# Patient Record
Sex: Female | Born: 1985 | Race: White | Hispanic: No | Marital: Single | Smoking: Current every day smoker
Health system: Southern US, Community
[De-identification: ages and names within clinical notes are randomized; demographics above are authoritative.]

## PROBLEM LIST (undated history)

## (undated) ENCOUNTER — Inpatient Hospital Stay (HOSPITAL_COMMUNITY): Payer: Self-pay

## (undated) DIAGNOSIS — G935 Compression of brain: Secondary | ICD-10-CM

## (undated) DIAGNOSIS — Z789 Other specified health status: Secondary | ICD-10-CM

## (undated) DIAGNOSIS — A5609 Other chlamydial infection of lower genitourinary tract: Secondary | ICD-10-CM

## (undated) HISTORY — DX: Other chlamydial infection of lower genitourinary tract: A56.09

## (undated) HISTORY — DX: Compression of brain: G93.5

## (undated) HISTORY — PX: ARM WOUND REPAIR / CLOSURE: SUR1141

## (undated) HISTORY — PX: DILATION AND CURETTAGE OF UTERUS: SHX78

## (undated) HISTORY — PX: OTHER SURGICAL HISTORY: SHX169

## (undated) HISTORY — PX: WISDOM TOOTH EXTRACTION: SHX21

---
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
## 2004-02-25 ENCOUNTER — Ambulatory Visit: Payer: Self-pay | Admitting: Family Medicine

## 2004-07-20 ENCOUNTER — Ambulatory Visit: Payer: Self-pay | Admitting: Family Medicine

## 2004-08-04 ENCOUNTER — Ambulatory Visit: Payer: Self-pay | Admitting: Family Medicine

## 2004-08-04 ENCOUNTER — Inpatient Hospital Stay (HOSPITAL_COMMUNITY): Admission: AD | Admit: 2004-08-04 | Discharge: 2004-08-04 | Payer: Self-pay | Admitting: Family Medicine

## 2004-08-07 ENCOUNTER — Ambulatory Visit: Payer: Self-pay | Admitting: Family Medicine

## 2004-10-11 ENCOUNTER — Ambulatory Visit: Payer: Self-pay | Admitting: Family Medicine

## 2004-10-30 ENCOUNTER — Ambulatory Visit: Payer: Self-pay | Admitting: Psychology

## 2005-01-11 ENCOUNTER — Ambulatory Visit: Payer: Self-pay | Admitting: Family Medicine

## 2005-03-27 ENCOUNTER — Ambulatory Visit: Payer: Self-pay | Admitting: Sports Medicine

## 2005-04-11 ENCOUNTER — Emergency Department (HOSPITAL_COMMUNITY): Admission: EM | Admit: 2005-04-11 | Discharge: 2005-04-11 | Payer: Self-pay | Admitting: Emergency Medicine

## 2005-06-08 ENCOUNTER — Ambulatory Visit: Payer: Self-pay | Admitting: Family Medicine

## 2005-06-20 ENCOUNTER — Ambulatory Visit: Payer: Self-pay | Admitting: Family Medicine

## 2005-06-25 ENCOUNTER — Encounter: Admission: RE | Admit: 2005-06-25 | Discharge: 2005-06-25 | Payer: Self-pay | Admitting: Sports Medicine

## 2005-08-29 ENCOUNTER — Ambulatory Visit: Payer: Self-pay | Admitting: Family Medicine

## 2005-09-11 ENCOUNTER — Ambulatory Visit: Payer: Self-pay | Admitting: Family Medicine

## 2005-10-14 ENCOUNTER — Emergency Department (HOSPITAL_COMMUNITY): Admission: EM | Admit: 2005-10-14 | Discharge: 2005-10-14 | Payer: Self-pay | Admitting: Emergency Medicine

## 2005-12-07 ENCOUNTER — Other Ambulatory Visit: Admission: RE | Admit: 2005-12-07 | Discharge: 2005-12-07 | Payer: Self-pay | Admitting: Family Medicine

## 2005-12-07 ENCOUNTER — Ambulatory Visit: Payer: Self-pay | Admitting: Family Medicine

## 2005-12-25 ENCOUNTER — Emergency Department (HOSPITAL_COMMUNITY): Admission: EM | Admit: 2005-12-25 | Discharge: 2005-12-25 | Payer: Self-pay | Admitting: Family Medicine

## 2005-12-31 ENCOUNTER — Emergency Department (HOSPITAL_COMMUNITY): Admission: EM | Admit: 2005-12-31 | Discharge: 2005-12-31 | Payer: Self-pay | Admitting: Family Medicine

## 2006-03-28 ENCOUNTER — Ambulatory Visit: Payer: Self-pay | Admitting: Sports Medicine

## 2006-04-05 ENCOUNTER — Encounter (INDEPENDENT_AMBULATORY_CARE_PROVIDER_SITE_OTHER): Payer: Self-pay | Admitting: Specialist

## 2006-04-05 ENCOUNTER — Encounter (INDEPENDENT_AMBULATORY_CARE_PROVIDER_SITE_OTHER): Payer: Self-pay | Admitting: Family Medicine

## 2006-04-05 ENCOUNTER — Other Ambulatory Visit: Admission: RE | Admit: 2006-04-05 | Discharge: 2006-04-05 | Payer: Self-pay | Admitting: Family Medicine

## 2006-04-05 ENCOUNTER — Ambulatory Visit: Payer: Self-pay | Admitting: Family Medicine

## 2006-04-05 LAB — CONVERTED CEMR LAB: Chlamydia, DNA Probe: NEGATIVE

## 2006-04-11 ENCOUNTER — Ambulatory Visit: Payer: Self-pay | Admitting: Family Medicine

## 2006-04-30 ENCOUNTER — Ambulatory Visit: Payer: Self-pay | Admitting: Family Medicine

## 2006-04-30 ENCOUNTER — Encounter (INDEPENDENT_AMBULATORY_CARE_PROVIDER_SITE_OTHER): Payer: Self-pay | Admitting: Specialist

## 2006-05-16 DIAGNOSIS — F529 Unspecified sexual dysfunction not due to a substance or known physiological condition: Secondary | ICD-10-CM | POA: Insufficient documentation

## 2006-05-16 DIAGNOSIS — F17200 Nicotine dependence, unspecified, uncomplicated: Secondary | ICD-10-CM | POA: Insufficient documentation

## 2006-05-16 DIAGNOSIS — F329 Major depressive disorder, single episode, unspecified: Secondary | ICD-10-CM | POA: Insufficient documentation

## 2006-05-17 ENCOUNTER — Encounter (INDEPENDENT_AMBULATORY_CARE_PROVIDER_SITE_OTHER): Payer: Self-pay | Admitting: Family Medicine

## 2006-05-17 ENCOUNTER — Encounter (INDEPENDENT_AMBULATORY_CARE_PROVIDER_SITE_OTHER): Payer: Self-pay | Admitting: *Deleted

## 2006-05-17 ENCOUNTER — Ambulatory Visit: Payer: Self-pay | Admitting: Family Medicine

## 2006-05-17 LAB — CONVERTED CEMR LAB
BUN: 12 mg/dL (ref 6–23)
CO2: 22 meq/L (ref 19–32)
Chloride: 108 meq/L (ref 96–112)

## 2006-05-29 ENCOUNTER — Ambulatory Visit: Payer: Self-pay | Admitting: Sports Medicine

## 2006-05-29 DIAGNOSIS — N760 Acute vaginitis: Secondary | ICD-10-CM | POA: Insufficient documentation

## 2006-05-29 DIAGNOSIS — R809 Proteinuria, unspecified: Secondary | ICD-10-CM | POA: Insufficient documentation

## 2006-05-29 LAB — CONVERTED CEMR LAB
Bilirubin Urine: NEGATIVE
Protein, U semiquant: 30
Specific Gravity, Urine: 1.025

## 2006-06-11 ENCOUNTER — Telehealth (INDEPENDENT_AMBULATORY_CARE_PROVIDER_SITE_OTHER): Payer: Self-pay | Admitting: Family Medicine

## 2006-07-04 ENCOUNTER — Ambulatory Visit: Payer: Self-pay | Admitting: Family Medicine

## 2006-07-08 ENCOUNTER — Telehealth: Payer: Self-pay | Admitting: Psychology

## 2006-07-31 ENCOUNTER — Telehealth: Payer: Self-pay | Admitting: *Deleted

## 2006-08-15 ENCOUNTER — Ambulatory Visit: Payer: Self-pay | Admitting: Family Medicine

## 2006-08-15 ENCOUNTER — Encounter (INDEPENDENT_AMBULATORY_CARE_PROVIDER_SITE_OTHER): Payer: Self-pay | Admitting: *Deleted

## 2006-08-15 ENCOUNTER — Telehealth: Payer: Self-pay | Admitting: *Deleted

## 2006-08-16 LAB — CONVERTED CEMR LAB: Chlamydia, DNA Probe: NEGATIVE

## 2006-08-20 ENCOUNTER — Telehealth: Payer: Self-pay | Admitting: *Deleted

## 2006-10-03 ENCOUNTER — Ambulatory Visit: Payer: Self-pay | Admitting: Family Medicine

## 2007-01-02 ENCOUNTER — Ambulatory Visit: Payer: Self-pay | Admitting: Family Medicine

## 2007-02-16 ENCOUNTER — Emergency Department (HOSPITAL_COMMUNITY): Admission: EM | Admit: 2007-02-16 | Discharge: 2007-02-16 | Payer: Self-pay | Admitting: Emergency Medicine

## 2007-04-02 ENCOUNTER — Ambulatory Visit: Payer: Self-pay | Admitting: Family Medicine

## 2007-06-18 ENCOUNTER — Ambulatory Visit: Payer: Self-pay | Admitting: Family Medicine

## 2007-09-03 ENCOUNTER — Encounter: Payer: Self-pay | Admitting: *Deleted

## 2007-09-05 ENCOUNTER — Ambulatory Visit: Payer: Self-pay | Admitting: Family Medicine

## 2007-09-30 ENCOUNTER — Encounter: Payer: Self-pay | Admitting: *Deleted

## 2007-10-15 ENCOUNTER — Ambulatory Visit: Payer: Self-pay | Admitting: Family Medicine

## 2007-12-05 ENCOUNTER — Ambulatory Visit: Payer: Self-pay | Admitting: Family Medicine

## 2007-12-08 ENCOUNTER — Encounter (INDEPENDENT_AMBULATORY_CARE_PROVIDER_SITE_OTHER): Payer: Self-pay | Admitting: *Deleted

## 2007-12-12 ENCOUNTER — Telehealth: Payer: Self-pay | Admitting: *Deleted

## 2008-01-20 ENCOUNTER — Emergency Department (HOSPITAL_COMMUNITY): Admission: EM | Admit: 2008-01-20 | Discharge: 2008-01-20 | Payer: Self-pay | Admitting: Emergency Medicine

## 2008-03-04 ENCOUNTER — Ambulatory Visit: Payer: Self-pay | Admitting: Family Medicine

## 2008-06-03 ENCOUNTER — Ambulatory Visit: Payer: Self-pay | Admitting: Family Medicine

## 2008-06-08 ENCOUNTER — Other Ambulatory Visit: Admission: RE | Admit: 2008-06-08 | Discharge: 2008-06-08 | Payer: Self-pay | Admitting: Family Medicine

## 2008-06-08 ENCOUNTER — Ambulatory Visit: Payer: Self-pay | Admitting: Family Medicine

## 2008-06-08 ENCOUNTER — Encounter: Payer: Self-pay | Admitting: Family Medicine

## 2008-06-08 DIAGNOSIS — N898 Other specified noninflammatory disorders of vagina: Secondary | ICD-10-CM | POA: Insufficient documentation

## 2008-06-08 DIAGNOSIS — F19939 Other psychoactive substance use, unspecified with withdrawal, unspecified: Secondary | ICD-10-CM | POA: Insufficient documentation

## 2008-06-08 LAB — CONVERTED CEMR LAB: Whiff Test: NEGATIVE

## 2008-07-26 ENCOUNTER — Telehealth: Payer: Self-pay | Admitting: Family Medicine

## 2008-07-27 ENCOUNTER — Encounter: Payer: Self-pay | Admitting: Family Medicine

## 2008-07-27 ENCOUNTER — Ambulatory Visit: Payer: Self-pay | Admitting: Family Medicine

## 2008-07-27 LAB — CONVERTED CEMR LAB

## 2008-07-28 ENCOUNTER — Ambulatory Visit: Payer: Self-pay | Admitting: Family Medicine

## 2008-07-28 DIAGNOSIS — A5609 Other chlamydial infection of lower genitourinary tract: Secondary | ICD-10-CM

## 2008-07-28 HISTORY — DX: Other chlamydial infection of lower genitourinary tract: A56.09

## 2008-07-28 LAB — CONVERTED CEMR LAB

## 2008-08-03 ENCOUNTER — Telehealth: Payer: Self-pay | Admitting: Family Medicine

## 2008-08-03 ENCOUNTER — Ambulatory Visit: Payer: Self-pay | Admitting: Family Medicine

## 2008-08-03 DIAGNOSIS — B3731 Acute candidiasis of vulva and vagina: Secondary | ICD-10-CM | POA: Insufficient documentation

## 2008-08-03 DIAGNOSIS — B373 Candidiasis of vulva and vagina: Secondary | ICD-10-CM

## 2008-09-01 ENCOUNTER — Telehealth: Payer: Self-pay | Admitting: Family Medicine

## 2008-09-02 ENCOUNTER — Ambulatory Visit: Payer: Self-pay | Admitting: Family Medicine

## 2008-11-30 ENCOUNTER — Ambulatory Visit: Payer: Self-pay | Admitting: Family Medicine

## 2008-11-30 ENCOUNTER — Encounter: Payer: Self-pay | Admitting: Family Medicine

## 2008-11-30 LAB — CONVERTED CEMR LAB
Chlamydia, DNA Probe: NEGATIVE
Whiff Test: POSITIVE

## 2008-12-06 ENCOUNTER — Telehealth: Payer: Self-pay | Admitting: Family Medicine

## 2008-12-29 ENCOUNTER — Encounter: Payer: Self-pay | Admitting: Family Medicine

## 2008-12-29 ENCOUNTER — Ambulatory Visit: Payer: Self-pay | Admitting: Family Medicine

## 2008-12-29 LAB — CONVERTED CEMR LAB
GC Probe Amp, Genital: NEGATIVE
Whiff Test: POSITIVE

## 2008-12-30 ENCOUNTER — Encounter: Payer: Self-pay | Admitting: Family Medicine

## 2009-01-06 ENCOUNTER — Ambulatory Visit: Payer: Self-pay | Admitting: Family Medicine

## 2009-01-06 ENCOUNTER — Encounter: Payer: Self-pay | Admitting: Family Medicine

## 2009-01-06 ENCOUNTER — Telehealth: Payer: Self-pay | Admitting: Family Medicine

## 2009-01-06 DIAGNOSIS — Z8742 Personal history of other diseases of the female genital tract: Secondary | ICD-10-CM | POA: Insufficient documentation

## 2009-01-06 LAB — CONVERTED CEMR LAB
Beta hcg, urine, semiquantitative: NEGATIVE
Chlamydia, DNA Probe: NEGATIVE
Ketones, urine, test strip: NEGATIVE
Nitrite: NEGATIVE
Protein, U semiquant: NEGATIVE
Specific Gravity, Urine: >=1.03
Urobilinogen, UA: 0.2
WBC Urine, dipstick: NEGATIVE

## 2009-01-07 ENCOUNTER — Encounter: Payer: Self-pay | Admitting: Family Medicine

## 2009-01-07 ENCOUNTER — Telehealth: Payer: Self-pay | Admitting: *Deleted

## 2009-01-25 ENCOUNTER — Telehealth: Payer: Self-pay | Admitting: Family Medicine

## 2009-03-07 ENCOUNTER — Ambulatory Visit: Payer: Self-pay | Admitting: Family Medicine

## 2009-03-07 ENCOUNTER — Telehealth: Payer: Self-pay | Admitting: Family Medicine

## 2009-03-07 LAB — CONVERTED CEMR LAB: Beta hcg, urine, semiquantitative: NEGATIVE

## 2009-04-01 ENCOUNTER — Encounter: Payer: Self-pay | Admitting: Family Medicine

## 2009-04-01 ENCOUNTER — Ambulatory Visit: Payer: Self-pay | Admitting: Family Medicine

## 2009-04-01 LAB — CONVERTED CEMR LAB
Blood in Urine, dipstick: NEGATIVE
GC Probe Amp, Genital: NEGATIVE
Glucose, Urine, Semiquant: NEGATIVE
Ketones, urine, test strip: NEGATIVE
Nitrite: NEGATIVE
Protein, U semiquant: 30
Urobilinogen, UA: 0.2
Whiff Test: POSITIVE

## 2009-05-25 ENCOUNTER — Telehealth: Payer: Self-pay | Admitting: Family Medicine

## 2009-05-25 ENCOUNTER — Ambulatory Visit: Payer: Self-pay | Admitting: Family Medicine

## 2009-05-25 ENCOUNTER — Encounter: Payer: Self-pay | Admitting: Family Medicine

## 2009-05-26 ENCOUNTER — Encounter: Payer: Self-pay | Admitting: *Deleted

## 2009-05-30 ENCOUNTER — Encounter: Admission: RE | Admit: 2009-05-30 | Discharge: 2009-05-30 | Payer: Self-pay | Admitting: Family Medicine

## 2009-07-21 ENCOUNTER — Ambulatory Visit: Payer: Self-pay | Admitting: Family Medicine

## 2009-08-01 ENCOUNTER — Telehealth: Payer: Self-pay | Admitting: Psychology

## 2009-08-08 ENCOUNTER — Telehealth: Payer: Self-pay | Admitting: Psychology

## 2009-08-18 ENCOUNTER — Ambulatory Visit: Payer: Self-pay | Admitting: Psychology

## 2009-08-18 DIAGNOSIS — F4310 Post-traumatic stress disorder, unspecified: Secondary | ICD-10-CM | POA: Insufficient documentation

## 2009-09-05 ENCOUNTER — Ambulatory Visit: Payer: Self-pay | Admitting: Psychology

## 2009-09-05 ENCOUNTER — Ambulatory Visit: Payer: Self-pay | Admitting: Family Medicine

## 2009-09-05 ENCOUNTER — Encounter: Payer: Self-pay | Admitting: Family Medicine

## 2009-09-14 ENCOUNTER — Other Ambulatory Visit: Admission: RE | Admit: 2009-09-14 | Discharge: 2009-09-14 | Payer: Self-pay | Admitting: Family Medicine

## 2009-09-14 ENCOUNTER — Encounter: Payer: Self-pay | Admitting: Family Medicine

## 2009-09-14 ENCOUNTER — Ambulatory Visit: Payer: Self-pay | Admitting: Family Medicine

## 2009-09-14 DIAGNOSIS — G43009 Migraine without aura, not intractable, without status migrainosus: Secondary | ICD-10-CM | POA: Insufficient documentation

## 2009-09-15 ENCOUNTER — Telehealth: Payer: Self-pay | Admitting: Psychology

## 2009-09-15 ENCOUNTER — Telehealth (INDEPENDENT_AMBULATORY_CARE_PROVIDER_SITE_OTHER): Payer: Self-pay | Admitting: *Deleted

## 2009-09-16 LAB — CONVERTED CEMR LAB: Pap Smear: NEGATIVE

## 2009-09-21 ENCOUNTER — Encounter: Payer: Self-pay | Admitting: Psychology

## 2009-09-21 ENCOUNTER — Encounter: Payer: Self-pay | Admitting: *Deleted

## 2009-09-21 ENCOUNTER — Telehealth: Payer: Self-pay | Admitting: Psychology

## 2009-09-21 ENCOUNTER — Ambulatory Visit: Payer: Self-pay | Admitting: Family Medicine

## 2009-09-23 LAB — CONVERTED CEMR LAB
Free T4: 1.18 ng/dL (ref 0.80–1.80)
T3, Free: 3.6 pg/mL (ref 2.3–4.2)
TSH: 0.857 microintl units/mL (ref 0.350–4.500)

## 2009-09-27 ENCOUNTER — Telehealth: Payer: Self-pay | Admitting: Psychology

## 2009-10-12 ENCOUNTER — Ambulatory Visit: Payer: Self-pay | Admitting: Family Medicine

## 2009-11-17 ENCOUNTER — Ambulatory Visit: Payer: Self-pay | Admitting: Family Medicine

## 2009-11-17 LAB — CONVERTED CEMR LAB: Whiff Test: POSITIVE

## 2009-11-18 ENCOUNTER — Telehealth: Payer: Self-pay | Admitting: *Deleted

## 2009-11-23 ENCOUNTER — Telehealth: Payer: Self-pay | Admitting: Family Medicine

## 2009-11-24 ENCOUNTER — Ambulatory Visit: Payer: Self-pay | Admitting: Family Medicine

## 2009-12-19 ENCOUNTER — Encounter: Payer: Self-pay | Admitting: Psychology

## 2009-12-26 ENCOUNTER — Encounter: Payer: Self-pay | Admitting: Family Medicine

## 2010-01-04 ENCOUNTER — Encounter: Payer: Self-pay | Admitting: *Deleted

## 2010-01-13 ENCOUNTER — Telehealth: Payer: Self-pay | Admitting: *Deleted

## 2010-01-26 ENCOUNTER — Encounter: Payer: Self-pay | Admitting: Family Medicine

## 2010-01-26 ENCOUNTER — Ambulatory Visit: Payer: Self-pay | Admitting: Family Medicine

## 2010-01-26 LAB — CONVERTED CEMR LAB
Bilirubin Urine: NEGATIVE
Chlamydia, Swab/Urine, PCR: NEGATIVE
Nitrite: NEGATIVE
Protein, U semiquant: NEGATIVE
Urobilinogen, UA: 0.2
WBC Urine, dipstick: NEGATIVE
pH: 6.5

## 2010-01-30 ENCOUNTER — Encounter: Payer: Self-pay | Admitting: Family Medicine

## 2010-02-05 ENCOUNTER — Telehealth: Payer: Self-pay | Admitting: Family Medicine

## 2010-02-22 ENCOUNTER — Telehealth: Payer: Self-pay | Admitting: Family Medicine

## 2010-02-28 ENCOUNTER — Encounter: Payer: Self-pay | Admitting: Psychology

## 2010-03-09 ENCOUNTER — Ambulatory Visit: Payer: Self-pay | Admitting: Obstetrics & Gynecology

## 2010-03-11 ENCOUNTER — Telehealth: Payer: Self-pay | Admitting: Family Medicine

## 2010-04-01 ENCOUNTER — Telehealth: Payer: Self-pay | Admitting: Family Medicine

## 2010-04-03 ENCOUNTER — Encounter: Payer: Self-pay | Admitting: Family Medicine

## 2010-04-03 ENCOUNTER — Ambulatory Visit
Admission: RE | Admit: 2010-04-03 | Discharge: 2010-04-03 | Payer: Self-pay | Source: Home / Self Care | Attending: Family Medicine | Admitting: Family Medicine

## 2010-04-03 DIAGNOSIS — R300 Dysuria: Secondary | ICD-10-CM | POA: Insufficient documentation

## 2010-04-03 LAB — CONVERTED CEMR LAB
Beta hcg, urine, semiquantitative: NEGATIVE
Blood in Urine, dipstick: NEGATIVE
GC Probe Amp, Genital: NEGATIVE
HIV: NONREACTIVE
Nitrite: NEGATIVE
Protein, U semiquant: NEGATIVE
Specific Gravity, Urine: 1.025
Urobilinogen, UA: 0.2

## 2010-04-04 ENCOUNTER — Telehealth: Payer: Self-pay | Admitting: Family Medicine

## 2010-04-09 ENCOUNTER — Encounter: Payer: Self-pay | Admitting: Family Medicine

## 2010-04-18 NOTE — Progress Notes (Signed)
Summary: Headache   Phone Note Call from Patient   Complaint: Headache Summary of Call: Pt reports acute episode of severe headache earlier today. Pt w/ baseline hx/o migraine headache. Has not been taking depakote for chronic management. Describes HA w/ distribution in back of head w/ pain radiation to the neck. Pain 10/10. No photophobia, blurry vision. Unsure of aura. + Neck stiffness. No fevers. No nausea. Feels that this is worst headache that she has ever had before. used excedrin migraine x1 15 min prior to call w/ minimal relief in sxs. Pt instructed to go to ED as this is likely a severe migraine, howver pt may need further imaging to rule out other causes given nuchal rigidity. Pt agreeable to plan.  Doree Albee MD February 05, 2010 6:13 PM

## 2010-04-18 NOTE — Assessment & Plan Note (Signed)
Summary: depo and u preg   Nurse Visit   Allergies: No Known Drug Allergies Laboratory Results   Urine Tests  Date/Time Received: September 05, 2009 11:18 AM  Date/Time Reported: September 05, 2009 11:31 AM     Urine HCG: negative Comments: ...........test performed by...........Marland KitchenTerese Door, CMA     Medication Administration  Injection # 1:    Medication: Depo-Provera 150mg     Diagnosis: CONTRACEPTIVE MANAGEMENT (ICD-V25.09)    Route: IM    Site: RUOQ gluteus    Exp Date: 04/2012    Lot #: [id number]    Mfr: [email or web address]    Comments: next depo due Sept 5 thur Sept 19, 2011    Patient tolerated injection without complications    Given by: Theresia Lo RN (September 05, 2009 11:40 AM)  Orders Added: 1)  Depo-Provera 150mg  [J1055] 2)  Admin of Injection (IM/SQ) [96372] 3)  U Preg-[facility] [81025]   Medication Administration  Injection # 1:    Medication: Depo-Provera 150mg     Diagnosis: CONTRACEPTIVE MANAGEMENT (ICD-V25.09)    Route: IM    Site: RUOQ gluteus    Exp Date: 04/2012    Lot #: [id number]    Mfr: [email or web address]    Comments: next depo due Sept 5 thur Sept 19, 2011    Patient tolerated injection without complications    Given by: Theresia Lo RN (September 05, 2009 11:40 AM)  Orders Added: 1)  Depo-Provera 150mg  [J1055] 2)  Admin of Injection (IM/SQ) [13086] 3)  U Preg-[facility] [81025]  patient states last sexual activity was more than 2 weeks ago. advised to use condoms for 7 days . Theresia Lo RN  September 05, 2009 11:41 AM  appointment scheduled for CPE 09/14/2009 Theresia Lo RN  September 05, 2009 11:47 AM

## 2010-04-18 NOTE — Progress Notes (Signed)
Summary: Request appt   Phone Note Call from Patient   Caller: Patient Call For: Spero Geralds, Psy.D. Summary of Call: Patient left VM while I was on vacation.  She wants to schedule an appt for [facility] (I think).  I called her back and her voice mailbox was full.  I was able to leave a call back number.  Will await follow-up. Initial call taken by: Spero Geralds PsyD,  Aug 01, 2009 4:04 PM  Follow-up for Phone Call        We connected and scheduled a Beh med appt for Monday, May 23rd at 9:00 a.m.  We plan to discuss a [facility] appt at that time. Follow-up by: Spero Geralds PsyD,  Aug 02, 2009 5:04 PM

## 2010-04-18 NOTE — Progress Notes (Signed)
Summary: Schedule [facility] follow-up   Phone Note Outgoing Call   Call placed by: Spero Geralds PsyD,  September 21, 2009 3:49 PM Call placed to: Patient Summary of Call: Needed to clarify follow-up [facility] appt time.  It is for July 27th at 11:30 - not 10:30 as originally stated.  She was not in the computer.  Aeron said she was told my front office that an appt could not be scheduled until she had the Medicaid issues cleared up.  he was working on that today.  Asked her to repeat back to me the revised appt time of 11:30.  She was able to do that.  Will expect to see her then unless I hear differently.  She also said that the meds prescribed today should be covered. Initial call taken by: Spero Geralds PsyD,  September 21, 2009 3:51 PM

## 2010-04-18 NOTE — Assessment & Plan Note (Signed)
Summary: depression,tcb   Vital Signs:  Patient profile:   25 year old female Height:      65 inches Weight:      149 pounds BMI:     24.88 Temp:     99.2 degrees F oral Pulse rate:   92 / minute BP sitting:   125 / 73  (left arm) Cuff size:   regular  Vitals Entered By: Garen Grams LPN (Jul 21, 452 3:21 PM) CC: ? depression Is Patient Diabetic? No Pain Assessment Patient in pain? no        Primary Care Provider:  Bobby Rumpf  MD  CC:  ? depression.  History of Present Illness: 1) "Feeling overwhelmed": Worse since last visit in March 2011. Concerned that she might be depressed. Has history of depression as a teenager with hospitalization for suicidal ideation. Reports symptoms of periods of increased sleep, decreased energy, daily sadness, being easy to anger, increased eating. Denies anhedonia - still enjoys going out, spending time with children, treating herself to a nice lunch; denies suicidal ideation or homicidal ideation. Reports symptoms of increased energy - she will stay up and clean everything, periods when her thoughts are racing. Multiple family members with history of depression. She is no longer in school (was studying communications) due to missing too many classes due to her increased sleping and decreased energy - she states that she was doing well in class until she started feeling this way. Fired from her job last week due to increased sleeping (she worked at [employer]	[place]). She is currently looking for a job but has been unsuccessful thus far. She was referred to [facility] (advised to call to set up appointment) in March 2011, but she never did.   Habits & Providers  Alcohol-Tobacco-Diet     Tobacco Status: current     Tobacco Counseling: to quit use of tobacco products  Current Medications (verified): 1)  None  Allergies (verified): No Known Drug Allergies  Physical Exam  General:  alert and cooperative to examination, pleasant and smiling as per usual but  appears stressed when talking about stressors  Psych:  Oriented X3, memory intact for recent and remote, normally interactive, good eye contact, and not anxious appearing., but appears stressed when talking about school, better insight regarding issues today    Impression & Recommendations:  Problem # 1:  ? of BIPOLAR AFFECTIVE DISORDER (ICD-296.80)  Meets DSM-IV criteria. Prior history appears to be consistent with bipolar disorder as well. Will refer to [facility] with Dr. Pascal Lux - patient to call today. Denies SI / HI. Discussed options for treatment with patient - patient is agreeable to starting medication after intial visit with Dr. Pascal Lux. Will follow up with patient in 2-3 weeks.   Orders: [facility]- Est Level  3 (09811)  Patient Instructions: 1)  It was great to see you today!  2)  Call Dr. Pascal Lux at [phone] to set up an appointment to be seen within the next 1-2 weeks. 3)  Think about healthy ways to help relieve stress and try to put these into place. 4)  Come back in 2-3 weeks, especially if you have not seen Dr. Pascal Lux by then

## 2010-04-18 NOTE — Assessment & Plan Note (Signed)
Summary: Behavioral Medicine Follow-up   Primary Care Provider:  Bobby Rumpf  MD   History of Present Illness: Suzanne Ellis presented after missing her last [facility] and [facility] appts.  We discussed this immediately.  We explored potential barriers including ambivalence about talking about mental health issues.  She acknowledged some ambivalence but does not think that is the reason for missed appointments.  She reports she wants to attend and will work hard to make that happen.    Did a lot of psychoeducation (Socratic style) around depression, bipolar disorder and specifically, the impact these disorders have on function.  She tells herself that because she wants to work, can go to school, can manage life as a single parent (today) that she is okay.  I asked her to consider the long-range view and look for CONSISTENT function.    Discussed an argument with Dawn (the second oldest sister).  She is likely closest to [facility].  Allergies: No Known Drug Allergies   Impression & Recommendations:  Problem # 1:  ? of BIPOLAR AFFECTIVE DISORDER (ICD-296.80) Report of mood is good.  Affect is consistent.  She managed the onfrontation about missed appointments very well.  ? Whether she has learned to say all the right things to get through the moment.  Talked about that a little bit.  She was able to note her strengths in terms of work and school.  Highlighted how many of those strengths are negatively impacted by downshifts in her mood and energy.  Also used diabetes as an analogy for mood disorders in that for some people, it doesn't matter how hard they work, they still need medication in order to function in a way that preserves health and well-being.  This seemed to resonate with her.  We agreed that one more missed appointment (without reasonable notice of cancellation) would indicate a lack commitment and would mean she would need to seek care elsewhere.  I gave her an out for scheduled [facility] but she elected to schedule for July 6th.  Follow-up for beh-med on June 28th at 3:00.  Gave her mood charting.  Need to review next visit.  Orders: Therapy 40-50- min- [facility] [phone])  Laboratory Results   Urine Tests  Date/Time Received: September 05, 2009 11:18 AM  Date/Time Reported: September 05, 2009 11:23 AM     Urine HCG: negative Comments: ...........test performed by...........Marland KitchenTerese Door, CMA

## 2010-04-18 NOTE — Progress Notes (Signed)
Summary: phn msg   Phone Note Call from Patient Call back at [facility] Phone [phone]   Caller: Patient Summary of Call: concerned about her "womanly parts" Initial call taken by: De Nurse,  November 23, 2009 1:56 PM  Follow-up for Phone Call        states she has been on depo almost 6 yrs. no period in years. will sometimes have a dark brown "pasty" discharge. smells bad.  has discussed with mds before but she is "really worried" appt at 8:30am per her request. Follow-up by: Golden Circle RN,  November 23, 2009 2:06 PM  Additional Follow-up for Phone Call Additional follow up Details #1::        Will follow clinic visit note.  Additional Follow-up by: Bobby Rumpf  MD,  November 24, 2009 8:39 AM

## 2010-04-18 NOTE — Progress Notes (Signed)
Summary: Rx Req   Phone Note Call from Patient Call back at Home Phone [phone]   Caller: Patient Summary of Call: Pt would like rx for yeast infection.   Initial call taken by: Clydell Hakim,  January 13, 2010 12:27 PM  Follow-up for Phone Call        message left on voicemail  that we will need to schedule an appointment in order to treat this problem. ask her to call back to schedule.  Follow-up by: Theresia Lo RN,  January 13, 2010 1:59 PM

## 2010-04-18 NOTE — Progress Notes (Signed)
Summary: Concerns about med   Phone Note Call from Patient   Caller: Patient Call For: Spero Geralds, Psy.D. Summary of Call: Shavana called with concerns about Lamictal.  She hasn't taken it yet.  She read on the pharmacy sheet and the internet the warnings and is "freaked out."  Broke down the issues in terms of tolerability (i.e. the potential for headache, increased restlessness etc...), safety (suicidal thoughts, unexpected rash) and efficacy (start low and go slow means an effect will not be seen immediately).  Talked about "the worst thing that could happen" and how she would handle that.  Encouraged her to resolve the ambivalence before starting the medicine otherwise she will likely take it intermittently and sabotage the chances for positive effect.  Also talked about the power of the mind and the benefit of believing that a medicine will be efficacious.  Used a motivational interviewing style.  Did not attempt to convince.  Will see her on the 27th in [facility].  She is to call to check-in as needed. Initial call taken by: Spero Geralds PsyD,  September 27, 2009 5:14 PM

## 2010-04-18 NOTE — Letter (Signed)
Summary: Generic Letter  [facility]  [address]   [place], [place] [address]   Phone: [phone]  Fax: [phone]    01/30/2010  Suzanne Ellis [address] [place], [place]  [address]  Dear [name],     Your recent labs from clinic were normal.  Please call the office if you have any questions.    Sincerely,   Alvia Grove DO  Appended Document: Generic Letter mailed

## 2010-04-18 NOTE — Progress Notes (Signed)
   Phone Note Call from Patient   Caller: Patient Call For: [phone] Summary of Call: Suzanne Ellis have been taking Lamical prescribed in July, but started taking it in Sept.  Patient is now having a rx from meds.  Showing red bumps to legs.   Initial call taken by: Abundio Miu,  February 22, 2010 12:19 PM  Follow-up for Phone Call        Called to discuss with patient - reports small red bumps on legs whenever takes Lamictal (had a trial off of it and noticed that the bumps went away then returned when she started taking it again). Advised to stop taking, advised to call to make appointment to discuss other options. Patient expressed understanding  Follow-up by: Bobby Rumpf  MD,  February 24, 2010 9:14 AM

## 2010-04-18 NOTE — Letter (Signed)
Summary: Suspension Letter  [facility]  [address]   [place], [place] [address]   Phone: [phone]  Fax: [phone]    01/04/2010  MELANNI BENWAY [address] [place], [place]  [address]  Dear [name],  You have missed 4 scheduled appointments with our practice.If you cannot keep your appointment, we expect you to call and cancel at least 24 hours before your appointment time.  As per our policy, we will now only give you limited medical services. means we will not call in a refill for you, or complete a form or make a referral except when you are here for a scheduled office visit.   If you miss 2 more appointments in the next year, we will dismiss you from our practice.  We hope this does not happen.  If you keep your appointments for the next year you will be returned to regular patient status.  We hope these changes will encourage you to keep your appointments so we may provide you the best medical care.   Our office staff can be reached at [phone] Monday through Friday from 8:30 a.m.-5:00 p.m. and will be glad to schedule your appointment as necessary.     Sincerely,   [facility]

## 2010-04-18 NOTE — Assessment & Plan Note (Signed)
Summary: brown,pasty discharge/[place]/carew   patient rescheduled appointment stating she really wanted to see her PCP . she rescheduled for 11/29/2009. Theresia Lo RN  November 24, 2009 9:33 AM   Vital Signs:  Patient profile:   25 year old female Weight:      142.5 pounds Temp:     99 degrees F oral Pulse rate:   84 / minute Pulse rhythm:   regular BP sitting:   115 / 63  (left arm) Cuff size:   regular  Vitals Entered By: Loralee Pacas CMA (November 24, 2009 8:41 AM)   Habits & Providers  Alcohol-Tobacco-Diet     Tobacco Status: current     Tobacco Counseling: to quit use of tobacco products     Cigarette Packs/Day: 1.0  Allergies: No Known Drug Allergies   Complete Medication List: 1)  Lamotrigine 25 Mg Tabs (Lamotrigine) .... Take one pill for two weeks in the a.m. and then two pills.  per dr. Kathrynn Running in mood disorder clinic. 2)  Zithromax 1 Gm Pack (Azithromycin) .... Sig all  at one time po 3)  Flagyl 500 Mg Tabs (Metronidazole) .Marland Kitchen.. 1 by mouth two times a day  Other Orders: No Charge Patient Arrived (NCPA0) (NCPA0)

## 2010-04-18 NOTE — Progress Notes (Signed)
Summary: Missed behavioral medicine appt   Phone Note Call from Patient   Caller: Patient Call For: Spero Geralds, Psy.D. Summary of Call: Patient called and left a VM saying she did not realize she had an appt - that the appt card I gave her at her last appt did not state a June 28th appt.  She said she still has the appt card.  If she can bring it in and show me that I wrote down the wrong date, I will be happy to reschedule beh-med.  She is still planning on attending her July 6th [facility] and said she would bring the card then. Initial call taken by: Spero Geralds PsyD,  September 15, 2009 2:03 PM

## 2010-04-18 NOTE — Letter (Signed)
Summary: Medical Necessity Form  Medical Necessity Form   Imported By: Clydell Hakim 01/03/2010 16:43:50  _____________________________________________________________________  External Attachment:    Type:   Image     Comment:   External Document
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
## 2010-04-18 NOTE — Assessment & Plan Note (Signed)
Summary: yeast inf,df   Vital Signs:  Patient profile:   25 year old female Height:      65 inches Weight:      141.13 pounds BMI:     23.57 BSA:     1.71 Temp:     97.9 degrees F Pulse rate:   85 / minute BP sitting:   115 / 70  Vitals Entered By: Jone Baseman CMA (January 26, 2010 2:24 PM) CC: ? yeast infection Is Patient Diabetic? No Pain Assessment Patient in pain? yes     Location: lower abd Intensity: 4   Primary Care Provider:  Bobby Rumpf  MD  CC:  ? yeast infection.  History of Present Illness: 25 yo female, work in appt, here for ? yeast infection.   Endorses chronic pelvic pain, acutely worsening.  Has itching, foul discharge and pain x 4 days.  Has been treated in the past multiple times for yeast infection.  Feels similar.  Last depo shot 09/05/09, has had unprotected intercourse since then.  Not using condoms. One partner currently, but has had several before.    Habits & Providers  Alcohol-Tobacco-Diet     Tobacco Status: current     Tobacco Counseling: to quit use of tobacco products     Cigarette Packs/Day: 1.0  Current Problems (verified): 1)  Abdominal Pain, Generalized  (ICD-789.07) 2)  Preventive Health Care  (ICD-V70.0) 3)  Migraine Without Aura  (ICD-346.10) 4)  Screening For Malignant Neoplasm of The Cervix  (ICD-V76.2) 5)  Ptsd  (ICD-309.81) 6)  ? of Bipolar Affective Disorder  (ICD-296.80) 7)  Pelvic Pain, Chronic  (ICD-789.09) 8)  Candidiasis, Vulvovaginal  (ICD-112.1) 9)  Chlamydtrachomatis Infection Lower [place]  (ICD-099.53) 10)  Analgesic Rebound Headache  (ICD-292.0) 11)  Screening For Malignant Neoplasm of The Cervix  (ICD-V76.2) 12)  Vaginal Discharge  (ICD-623.5) 13)  Exposure To Communicable Disease Nos  (ICD-V01.9) 14)  Tobacco Dependence  (ICD-305.1) 15)  Sexual Function Problem  (ICD-V41.7) 16)  Depressive Disorder, Nos  (ICD-311) 17)  Contraceptive Management  (ICD-V25.09) 18)  Bacterial Vaginitis   (ICD-616.10) 19)  Proteinuria  (ICD-791.0)  Current Medications (verified): 1)  Lamotrigine 25 Mg Tabs (Lamotrigine) .... Take One Pill For Two Weeks in The A.m. and Then Two Pills.  Per Dr. Kathrynn Running [facility]. 2)  Zithromax 1 Gm Pack (Azithromycin) .... Sig All  At One Time Po 3)  Flagyl 500 Mg Tabs (Metronidazole) .Marland Kitchen.. 1 By Mouth Two Times A Day 4)  Fluconazole 150 Mg Tabs (Fluconazole) .... Take 1 Pill By Mouth Daily For Yeast Infection  Allergies (verified): No Known Drug Allergies  Past History:  Past Medical History: Last updated: 05/29/2006 decreased libido h/o depression and ADHD (?oppositional defiant d/o, h/o suicide attempt, PID multiple times/TOA  Past Surgical History: Last updated: 05/16/2006 NSVD female 2000 - 03/19/1998, SAB at 6 weeks - 03/20/1999, TAB 2001 (4wks), 2003 (4wks) - 03/19/2001, TAB 2006 - 07/20/2004  Family History: Last updated: 05/16/2006 multiple sisters with ADHD, ODD, OCD, Depression  Social History: Last updated: 11/30/2008 lives with 2 daughtesr (5 and 9), younger sister (22); works as Production assistant, radio at [employer]. In school for Communications degree. Sexually active, uses protection "every time".  +tobacco,  occasiona ETOH, denies drugs. Enjoys watching TV, reading, going to movies with friends.   Risk Factors: Smoking Status: current (01/26/2010) Packs/Day: 1.0 (01/26/2010)  Review of Systems       see hpi  Physical Exam  General:  alert,  well-developed, well-nourished, and well-hydrated.   Lungs:  CTAB w/o wheeze  Heart:  RRR no murmurs  Abdomen:  soft, normal bowel sounds, no distention, no masses, and epigastric tenderness.   Skin:  turgor normal, color normal, and no rashes.     Impression & Recommendations:  Problem # 1:  CANDIDIASIS, VULVOVAGINAL (ICD-112.1) Likely yeast infection given hx of similar.  Check UA and culture. Pt reports hx of PID in the past.  Transvaginal u/s last done in 05/2009.  No documentation  of PID diagniosis found in centricity.  However, given pt's reported ongoing pelvic pain, relieved by almost nothing, reasonable to refer to gyn for further evaluation.   Her updated medication list for this problem includes:    Fluconazole 150 Mg Tabs (Fluconazole) .Marland Kitchen... Take 1 pill by mouth daily for yeast infection  Orders: [facility]- Est  Level 4 (25366)  Problem # 2:  CONTRACEPTIVE MANAGEMENT (ICD-V25.09) Upreg today, depo if neg Orders: [facility]- Est  Level 4 (44034) Depo-Provera 150mg  (J1055)  Problem # 3:  EXPOSURE TO COMMUNICABLE DISEASE NOS (ICD-V01.9) >20 minutes spent on educating and counseling pt about STD's and risk of getting them or spreading them without using condom's.    Complete Medication List: 1)  Lamotrigine 25 Mg Tabs (Lamotrigine) .... Take one pill for two weeks in the a.m. and then two pills.  per dr. Kathrynn Running in [facility]. 2)  Zithromax 1 Gm Pack (Azithromycin) .... Sig all  at one time po 3)  Flagyl 500 Mg Tabs (Metronidazole) .Marland Kitchen.. 1 by mouth two times a day 4)  Fluconazole 150 Mg Tabs (Fluconazole) .... Take 1 pill by mouth daily for yeast infection  Other Orders: Urinalysis-[facility] (00000) U Preg-[facility] (74259) GC/Chlamydia-[facility] (87591/87491) Gynecologic Referral (Gyn)  Patient Instructions: 1)  Nice to meet you today. 2)  Please take 1 diflucan for your yeast infection. 3)  As long as your urine preg test is negative then please get your depo shot.  4)  Make sure you wear condoms for protection against STD's and pregnancy protection. 5)  I will call you when we schedule your gyn appt. 6)  Please schedule an appointment with your primary doctor in as needed.  Prescriptions: FLUCONAZOLE 150 MG TABS (FLUCONAZOLE) take 1 pill by mouth daily for yeast infection  #2 x 3   Entered and Authorized by:   Alvia Grove DO   Signed by:   Alvia Grove DO on 01/26/2010   Method used:   Electronically to        [id number] (retail)       [address] [place], [place]  [address]       Ph: [phone]       Fax: [phone]   RxID:   [id number]    Medication Administration  Injection # 1:    Medication: Depo-Provera 150mg     Diagnosis: CONTRACEPTIVE MANAGEMENT (ICD-V25.09)    Route: IM    Site: RUOQ gluteus    Exp Date: 05/17/2012    Lot #: [id number]    Mfr: Francisca December    Comments: Next Depo Due: January 26 - February 9    Patient tolerated injection without complications    Given by: Garen Grams LPN (January 26, 2010 3:15 PM)  Orders Added: 1)  Urinalysis-[facility] [00000] 2)  U Preg-[facility] [81025] 3)  GC/Chlamydia-[facility] [87591/87491] 4)  [facility]- Est  Level 4 [83151] 5)  Depo-Provera 150mg  [J1055] 6)  Gynecologic Referral [Gyn]  Laboratory Results   Urine Tests  Date/Time Received: January 26, 2010 2:49 PM  Date/Time Reported: January 26, 2010 3:04 PM   Routine Urinalysis   Color: yellow Appearance: Clear Glucose: negative   (Normal Range: Negative) Bilirubin: negative   (Normal Range: Negative) Ketone: negative   (Normal Range: Negative) Spec. Gravity: 1.025   (Normal Range: 1.003-1.035) Blood: negative   (Normal Range: Negative) pH: 6.5   (Normal Range: 5.0-8.0) Protein: negative   (Normal Range: Negative) Urobilinogen: 0.2   (Normal Range: 0-1) Nitrite: negative   (Normal Range: Negative) Leukocyte Esterace: negative   (Normal Range: Negative)    Urine HCG: negative Comments: ...........test performed by...........Marland KitchenTerese Door, CMA       Medication Administration  Injection # 1:    Medication: Depo-Provera 150mg     Diagnosis: CONTRACEPTIVE MANAGEMENT (ICD-V25.09)    Route: IM    Site: RUOQ gluteus    Exp Date: 05/17/2012    Lot #: [id number]    Mfr: Francisca December    Comments: Next Depo Due: January 26 - February 9    Patient tolerated injection without complications    Given by: Garen Grams LPN (January 26, 2010 3:15 PM)  Orders Added: 1)  Urinalysis-[facility] [00000] 2)  U Preg-[facility]  [81025] 3)  GC/Chlamydia-[facility] [87591/87491] 4)  [facility]- Est  Level 4 [04540] 5)  Depo-Provera 150mg  [J1055] 6)  Gynecologic Referral [Gyn]

## 2010-04-18 NOTE — Progress Notes (Signed)
Summary: Behavioral Medicine Appt   Phone Note Call from Patient   Caller: Patient Call For: Spero Geralds, Psy.D. Summary of Call: Patient left VM at 7:51 wondering what time her appt was today.  I didn't get the message until her appt time - 9:00.  We rescheduled for June 2nd at 3:00. Initial call taken by: Spero Geralds PsyD,  Aug 08, 2009 9:05 AM

## 2010-04-18 NOTE — Assessment & Plan Note (Signed)
Summary: CPE AND PAP/KH   Vital Signs:  Patient profile:   25 year old female Height:      65 inches Weight:      149.1 pounds BMI:     24.90 Temp:     98.4 degrees F oral Pulse rate:   84 / minute BP sitting:   121 / 67  (left arm) Cuff size:   regular  Vitals Entered By: Garen Grams LPN (September 14, 2009 1:49 PM) CC: cpe/pap/std check Is Patient Diabetic? No Pain Assessment Patient in pain? no        Primary Care Provider:  Bobby Rumpf  MD  CC:  cpe/pap/std check.  History of Present Illness: 1) Tobacco use: Contemplative about quitting. Did not like Quit-line. "Cannot afford nicotine replacement and worried about potential side effects of other medications.  2) STD check: Sexually active. Denies dysuria, lesions, discharge, vaginal pain, abdominal pain. Would like check for GC/CT as well as wet prep as she is sexually active and uses condoms intermittently.   3) Headache: No prior history of migraine. Prior history of NSAID rebound headache. Reports "pounding" headache usually on right side of head, occasionally with neck pain, better with applying pressure to head, worse with bright lights, loud noises, summer time, smoking cigarettes. +ve nausea w/o emesis. Has tried excedrin migraine with some relief. Usually last > 4 hours. Denies focal neurological signs, weakness, emesis, visual changes, eye pain. Unsure about fam hx of migraine    Habits & Providers  Alcohol-Tobacco-Diet     Tobacco Status: current     Tobacco Counseling: to quit use of tobacco products  Current Medications (verified): 1)  Depakote 250 Mg Tbec (Divalproex Sodium) .... One Tab By Mouth Two Times A Day For Migraine Prophylaxis  Allergies (verified): No Known Drug Allergies  Review of Systems       as per HPI also positive for manic / depressive symptoms as per prior assessments - these have improved somewhat since the last time I saw her.   Physical Exam  General:  alert and cooperative to  examination, pleasant and smiling as per usual Head:  normocephalic and atraumatic.  no sinus tenderness to palpation  Eyes:  vision grossly intact, pupils equal, round and reactive to light , extraoccular movements intact , no conjunctivitis  Mouth:  no oral lesions  Neck:  supple, full ROM  Lungs:  CTAB w/o wheeze  Heart:  RRR no murmurs  Abdomen:  soft, non tender, non distended, +BS, no masses Genitalia:  Pelvic Exam:        External: normal female genitalia without lesions or masses        Vagina: normal without lesions or masses        Cervix: normal without lesions or masses        Adnexa: normal bimanual exam without masses or fullness        Uterus: normal by palpation        Pap smear: not performed Msk:  5/5 strength bilateral upper and lower extremities  Pulses:  2+ radials  Extremities:  no edema or cyanosis  Neurologic:  alert & oriented X3, cranial nerves II-XII intact, strength normal in all extremities, gait normal, and DTRs symmetrical and normal.     Impression & Recommendations:  Problem # 1:  Preventive Health Care (ICD-V70.0) Assessment Comment Only  Pap today. Follow up results. Other problems assessed below  Orders: [facility] - Est  18-39 yrs (84132)  Problem # 2:  TOBACCO DEPENDENCE (ICD-305.1) Assessment: Unchanged Contemplative. Would like information on cessation class - will give patient this information (by phone - left before I could get brochure).   Problem # 3:  EXPOSURE TO COMMUNICABLE DISEASE NOS (ICD-V01.9) Assessment: New Wet prep wnl. Will follow GC/CT. Advised regarding safe sexual practices.  Orders: GC/Chlamydia-[facility] (87591/87491) Wet Prep- [facility] ([address])  Problem # 4:  MIGRAINE WITHOUT AURA (ICD-346.10) Assessment: New Will start depakote as below (may have added benefit with likely bipolar affective disorder) . Would avoid propanolol - may make depressive symptoms worse, would avoid TCA - may make manic symptoms worse, though less likely  than SSRIs.   Complete Medication List: 1)  Depakote 250 Mg Tbec (Divalproex sodium) .... One tab by mouth two times a day for migraine prophylaxis  Other Orders: Pap Smear-[facility] (60454-09811)  Patient Instructions: 1)  Take the prescription for Depakote to the pharmacy to see how much it costs. If you can afford it, start taking to prevent migraines. 2)  Follow up in three months if not improving.  3)  Follow up with Dr. Pascal Lux as scheduled  Prescriptions: DEPAKOTE 250 MG TBEC (DIVALPROEX SODIUM) one tab by mouth two times a day for migraine prophylaxis  #60 x 0   Entered and Authorized by:   Bobby Rumpf  MD   Signed by:   Bobby Rumpf  MD on 09/14/2009   Method used:   Print then Give to Patient   RxID:   [id number]   Laboratory Results  Date/Time Received: September 14, 2009 2:49 PM  Date/Time Reported: September 14, 2009 3:07 PM   Wet [place] Source: vag WBC/hpf: 1-5 Bacteria/hpf: 1+  Rods Clue cells/hpf: none  Negative whiff Yeast/hpf: none Trichomonas/hpf: none Comments: Ellery Plunk MD  September 14, 2009 3:07 PM

## 2010-04-18 NOTE — Letter (Signed)
Summary: Probation Letter  [facility]  [address]   [place], [place] [address]   Phone: [phone]  Fax: [phone]    09/21/2009  Suzanne Ellis [address] [place], [place]  [address]  Dear [name],  With the goal of better serving all our patients the [facility] is following each patient's missed appointments.  You have missed at least 3 appointments with our practice.If you cannot keep your appointment, we expect you to call at least 24 hours before your appointment time.  Missing appointments prevents other patients from seeing [place] and makes it difficult to provide you with the best possible medical care.      1.   If you miss one more appointment, we will only give you limited medical services. This means we will not call in medication refills, complete a form, or make a referral for you except when you are here for a scheduled office visit.    2.   If you miss 2 or more appointments in the next year, we will dismiss you from our practice.    Our office staff can be reached at [phone] Monday through Friday from 8:30 a.m.-5:00 p.m. and will be glad to schedule your appointment as necessary.    Thank you.   [facility]  Appended Document: Probation Letter mailed

## 2010-04-18 NOTE — Assessment & Plan Note (Signed)
Summary: c/o very bad vaginal d/c and odor/bmc   Vital Signs:  Patient profile:   25 year old female Weight:      142.8 pounds Temp:     99.4 degrees F oral Pulse rate:   81 / minute Pulse rhythm:   regular BP sitting:   150 / 74  (left arm) Cuff size:   regular  Vitals Entered By: Loralee Pacas CMA (November 17, 2009 11:20 AM)  Primary Care Provider:  Bobby Rumpf  MD   History of Present Illness: 4 days vaginal d/c--no itching. Fishy smell.  Has had hx of chlamydia.  No abdominal pain, fever sweats. Sexually active. Monogamous relationship unclear.  Current Medications (verified): 1)  Lamotrigine 25 Mg Tabs (Lamotrigine) .... Take One Pill For Two Weeks in The A.m. and Then Two Pills.  Per Dr[facility] Kathrynn [facility]. 2)  Zithromax 1 Gm Pack (Azithromycin) .... Sig All  At One Time Po 3)  Flagyl 500 Mg Tabs (Metronidazole) .Marland Kitchen.. 1 By Mouth Two Times A Day  Allergies: No Known Drug Allergies  Physical Exam  General:  alert, well-developed, well-nourished, and well-hydrated.   Abdomen:  sogt Genitalia:  normal introitus and no external lesions.  thick white d/c. cervix is not friable, no bleeding. no cervical lesions.    Impression & Recommendations:  Problem # 1:  VAGINAL DISCHARGE (ICD-623.5) Orders: Wet Prep- [facility] (04540) [facility]- Est Level  3 (99213)treat for bacterial vaginosis. Gc/ chlamydia pending. Will emoirically treat in office today for chlamydia given her history. Willlet her know results of culture and she can base treatment of her partner on that--no sex until cx results known as she can become re-infected--we discussed. Alternatively her partner could also be empirically treated as she is aware.  Complete Medication List: 1)  Lamotrigine 25 Mg Tabs (Lamotrigine) .... Take one pill for two weeks in the a.m. and then two pills.  per dr. Kathrynn Running in [facility]. 2)  Zithromax 1 Gm Pack (Azithromycin) .... Sig all  at one time po 3)  Flagyl 500 Mg  Tabs (Metronidazole) .Marland Kitchen.. 1 by mouth two times a day [facility] ([facility] Azithromycin oral (Q0144) [facility]- Est Level  3 (99213) GC/Chlamydia-[facility] (87591/87491) Prescriptions: FLAGYL 500 MG TABS (METRONIDAZOLE) 1 by mouth two times a day  #14 x 1   Entered and Authorized by:   Denny Levy MD   Signed by:   Denny Levy MD on 11/17/2009   Method used:   Electronically to        [facility] [address] [id number]* (retail)       [address] [place], [place]  [address]       Ph: [phone]       Fax: [phone]   RxID:   [id number] ZITHROMAX 1 GM PACK (AZITHROMYCIN) sig all  at one time po  #1 x 1   Entered and Authorized by:   Denny Levy MD   Signed by:   Denny Levy MD on 11/17/2009   Method used:   Electronically to        [facility] [address] #[id number]* (retail)       [address] [place], [place]  [address]       Ph: [phone]       Fax: [phone]   RxID:   [id number]   Laboratory Results  Date/Time Received: November 17, 2009 11:25 AM  Date/Time Reported: November 17, 2009 11:45 AM   [employer] Source: vaginal WBC/hpf: 1-5 Bacteria/hpf: 3+  Cocci Clue cells/hpf: few  Positive whiff Yeast/hpf: none Trichomonas/hpf: none Comments: ...........test performed by...........Marland KitchenTerese Door, CMA      Medication Administration  Medication # 1:    Medication: Azithromycin oral    Diagnosis: CANDIDIASIS, VULVOVAGINAL (ICD-112.1)    Dose: 1g    Route: po    Exp Date: 06/18/2011    Lot #: [id number]    Mfr: pfizer    Patient tolerated medication without complications    Given by: Loralee Pacas CMA (November 17, 2009 11:52 AM)  Or[facility] Added: 1)  [facility] 2)  Azithromycin oral [Q0144] 3)  [facility]- Est Level  3 [99213] 4)  GC/Chlamydia-[facility] [87591/87491]   Impression & Recommendations:  Orders: Wet Prep- [facility] [phone])   Orders: Mellody Drown Prep- [facility] (87210) [facility]- Est Level  3 (62952)   Orders: Wet Prep- [facility] (84132) [facility]- Est Level  3  (44010) GC/Chlamydia-[facility] (87591/87491)   Complete Medication List: 1)  Lamotrigine 25 Mg Tabs (Lamotrigine) .... Take one pill for two weeks in the a.m. and then two pills.  per dr. Kathrynn Running in [facility]. 2)  Zithromax 1 Gm Pack (Azithromycin) .... Sig all  at one time po 3)  Flagyl 500 Mg Tabs (Metronidazole) .Marland Kitchen.. 1 by mouth two times a day  Other Orders: Azithromycin oral (U7253)

## 2010-04-18 NOTE — Assessment & Plan Note (Signed)
Summary: Initial [facility]   Primary Care Provider:  Bobby Rumpf  MD   History of Present Illness: Suzanne Ellis presents late for her [facility] appt stating she locked her keys out of her car.  She reports she is interested in getting medication to treat her mood issues.  She states she has come to believe that she has Bipolar Disorder based on conversations during her Behavioral Medicine appts as well as things she has heard all of her life including some conversations with a school counselor recently.  Discussed symptomatology today attempting to get a typical pattern for her mood issues.  She reports 3-4 days of elevated mood on a regular basis with frequent depressed moods in between.  Highs are associated with decreased need for sleep, impulsiveness (spending) and elevated mood.  Denies irritability.  Thoughts are fast but manageable.  Depressed moods are associated with hypersomnia, significant feelings of "regret," lack of energy, depressed mood and anhedonia.    Suicide attempt reported as a teenager - overdosed on Tylenol - 7 days at [facility].  Diagonsed with ODD.  Family history positive for Bipolar in mother.  She does not think her sisters have been formally diagnosed but she suspects mental health issues in them as well.  See recent beh med note for further history.  Denied current drug use.  Used marijuana regularly up until about six years ago.  Started to make her paranoid.  Ecstasy use for the first time in January, 2011.  Last time as well.  Occasional use of alcohol (twice a month).  Does drink to get drunk.  Tobacco use positive.  Allergies: No Known Drug Allergies   Impression & Recommendations:  Problem # 1:   BIPOLAR AFFECTIVE DISORDER (ICD-296.80) Report of mood is euthymic.  Affect is consistent.  Denied suicidal or homicidal ideation.  Thoughts are clear and goal directed.  Insight is above average.    Meets criteria for Bipolar II  disorder.  She agrees with the diagnosis.  Discussed options including Depakote (which was recently prescribed for migraine prophylaxis by Dr. Wallene Huh).  She has yet to fill this prescription secondary to cost.  Decided that Lamictal, which tends to help with Bipolar depression, might be a better match.  The hypomanic episodes are not felt by [name] to be troublesome in nature - she likes them.  Depakote might attenuate those in a way that would be perceieved as negative by [employer].  She agreed with the treatment plan.  Discussed AE including the need for slow titration to avoid a rash.  Asked her to call if she experiences an unexpected rash.  Discussed decreasing use of OTC headache medicines as a way to decrease headaches.  This will be challenging.  Did talk to Dr. Wallene Huh after our meeting to discuss our treatment plan.  He has thought about migraine abortive agents but they have been cost-prohibitive.  If [place] Medicaid comes through - this may be an option.  Also discussed the effect the progestins in Depo might be having on mood.  Mashanda thinks it may be having an effect as well.  Preventing pregancy is key, however and other options may not do this as well.  She has discussed with Dr. Wallene Huh on a number of occasions.  Will continue to consider.      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  She did not bring  appt card with her today.  Because limit setting is deemed to be important, will maintain this limit.  If she can produce the card, I will be happy to schedule in [facility].  Otherwise - should consider referral to [facility] group or outside therapist.  Complete Medication List: 1)  Lamotrigine 25 Mg Tabs (Lamotrigine) .... Take one pill for two weeks in the a.m. and then two pills.  per dr. Kathrynn Running in [facility] disorder clinic.  Other Orders: TSH-[facility] [phone]) Free T3-[facility] [phone]) Free T4-[facility]  [phone]) Therapy 40-50- min- [facility] [phone])  Patient Instructions: 1)  Please schedule a follow-up in [facility] for July 27th at 10:30. 2)  Check out the website:  [email or web address]  If you can continue with mood charting - that would be great.  There is a website called [email or web address].   3)  Dr. Kathrynn Running started a medicine called Lamictal.  Please call the [facility] if you experience an unexpected rash.  Prescriptions: LAMOTRIGINE 25 MG TABS (LAMOTRIGINE) Take one pill for two weeks in the a.m. and then two pills.  Per Dr. Kathrynn Running in [facility].  #60 x 0   Entered and Authorized by:   Spero Geralds PsyD   Signed by:   Spero Geralds PsyD on 09/21/2009   Method used:   Handwritten   RxID:   [id number]

## 2010-04-18 NOTE — Assessment & Plan Note (Signed)
Summary: [facility]   Primary Care Provider:  Bobby Rumpf  MD   History of Present Illness: Suzanne Ellis presents to discuss medication and mood. She has had several down days in the last few weeks.  Crying "for no reason" and feeling a lot of guilt.  She wants to know how to fix these things.  Discussed options.  Allergies: No Known Drug Allergies   Impression & Recommendations:  Problem # 1:  BIPOLAR AFFECTIVE DISORDER (ICD-296.80) Used motivational interviewing to better understand her ambivalence.  On an importance scale she is an 8 or 9 for taking a medication.  She is the same on confidence.  She remains fearful of bad outcomes associated with medication.   Used cognitive techniques to explore this further.  Provided her options for improved mood control and function:  therapy, medication and both.  Both is likely the most effective.  The decision is hers.  Used CBT to address a few other things and provided her a therapy referral as she has been unable to produce the appointment card that was discussed.    Asked her to continue discussing her ambivalence about medication with others (therapist, family) and if she decides to start it, to call to schedule in [facility].  We need to see her shortly after she starts it.  Complete Medication List: 1)  Lamotrigine 25 Mg Tabs (Lamotrigine) .... Take one pill for two weeks in the a.m. and then two pills.  per dr. Kathrynn Running in mood disorder clinic.  Other Orders: Therapy 20-30 min- [facility] [phone])  Patient Instructions: 1)  Suzanne Ellis is a great therapist who often counsels people with Bipolar Disorder.  Her number is:  [phone] 2)  Taking a medication for this illness, given your history is a big leap of faith.  You are actively thinking about it.  We are here to help you talk it out as needed.  Ultimately, it is your decision.

## 2010-04-18 NOTE — Progress Notes (Signed)
Summary: Test Res   Phone Note Call from Patient Call back at Home Phone [phone]   Caller: Patient Summary of Call: Checking on test results from today. Initial call taken by: Clydell Hakim,  May 25, 2009 4:59 PM  Follow-up for Phone Call        will forward to MD. Follow-up by: Theresia Lo RN,  May 25, 2009 5:32 PM  Additional Follow-up for Phone Call Additional follow up Details #1::        Called to relay results. No clue cells, no yeast, no trichomonads. Will pass along results for GC/CT when available.  Additional Follow-up by: Bobby Rumpf  MD,  May 25, 2009 6:26 PM

## 2010-04-18 NOTE — Miscellaneous (Signed)
Summary: [place] approved   Clinical Lists Changes medsolutions approved [place]. #[id number].Golden Circle RN  May 26, 2009 11:28 AM

## 2010-04-18 NOTE — Letter (Signed)
Summary: Appeals committee [employer]    [facility]  [address]   [place], [place] [address]   Phone: [phone]  Fax: [phone]       12/19/2009  Letter released to: [facility] [address] [place], [place]  [address]  Dear Mr. Dudley Major:  Ms. Somoza has asked for a letter documenting her medical condition and I am happy to provide it.  On September 21, 2009, Ms. Nierenberg was seen in a specialty clinic at the [facility] to discuss her mood and function.  At that time she was diagnosed with Bipolar II Disorder.  Some of the main symptoms of this disorder include periods of time of depressed mood and hypomania where increased energy or irritability may be present.  These shifts in mood and energy frequently have a negative effect on function.  A person's ability to function in relationships at work and / or at school is often compromised.  Ms. Dahan likely met criteria for this diagnosis since mid to late adolescence although she was not seen by our clinic at the time.  If I can be of further assistance, please let me know.  Sincerely,    Spero Geralds, PsyD. Psychologist

## 2010-04-18 NOTE — Assessment & Plan Note (Signed)
Summary: std ck,df   Vital Signs:  Patient profile:   25 year old female Height:      65 inches Weight:      142 pounds BMI:     23.72 BSA:     1.71 Temp:     98.2 degrees F Pulse rate:   77 / minute BP sitting:   132 / 69  Vitals Entered By: Jone Baseman CMA (April 01, 2009 2:57 PM) CC: vaginal discharge  Is Patient Diabetic? No Pain Assessment Patient in pain? no        Primary Care Provider:  Bobby Rumpf  MD  CC:  vaginal discharge .  History of Present Illness: 1) Vaginal discharge: reports vaginal discharge intermittently since last visit. discharge is clear, with fishy odor. Denies dysuria (though does report a sensation of "fullness" with urination), dyspareunia, back pain, fever, vaginal lesions, diarrhea, constipation. Reports history of mild pelvic pain intermittenly as well. Has history of PID treated several years ago. Had been referred for u/s appointment to evaluate her chronic pelvic pain, but never received appointment. Sexually active, uses condoms sometimes. On Depo currently (LMP 4 years ago). Last STD w/ negative for GC/CT/HIV/RPR in 10/10.    Habits & Providers  Alcohol-Tobacco-Diet     Tobacco Status: current     Tobacco Counseling: to quit use of tobacco products     Cigarette Packs/Day: 1.0  Allergies: No Known Drug Allergies  Physical Exam  General:  alert and cooperative to examination, pleasant  Mouth:  no oral lesions  Neck:  supple and full ROM, lymphadenopathy   Abdomen:   mildly TTP in lower abdomen, +BS Genitalia:  normal introitus, no external lesions, no vaginal discharge, mucosa pink and moist, and no vaginal or cervical lesions.  no cervical motion tendernes, mild TTP areas corresponding to adnexa bilaterally, clear vaginal d/c w/o odor,    Impression & Recommendations:  Problem # 1:  VAGINAL DISCHARGE (ICD-623.5) Assessment New UA negative for infection, Upreg negative GC/CT negative. Wet prep with BV. Will treat with  Flagyl. Will give script for fluconazole as needed for yeast infection given that patient generally gets vulvovaginal candidiasis after flagyl use. Advised to eat yogurt.  Orders: Urinalysis-[facility] (00000) GC/Chlamydia-[facility] (87591/87491) Wet Prep- [facility] (11914) [facility]- Est  Level 4 (78295)  Problem # 2:  PELVIC PAIN, CHRONIC (ICD-789.09) Assessment: Unchanged  Patient with history of prior PID, now with chronic pelvic pain. Will check pelvis u/s to further eval. NSAIDs as needed. GC/CT negative. Upreg negative.   Orders: [facility]- Est  Level 4 (62130) Ultrasound (Ultrasound)  Complete Medication List: 1)  Flagyl 500 Mg Tabs (Metronidazole) .... Take 500 mg by mouth two times a day x 7 days 2)  Fluconazole 150 Mg Tabs (Fluconazole) .... Take one tablet by mouth for yeast infection  Other Orders: U Preg-[facility] (86578)  Patient Instructions: 1)  It was great to see you today!  2)  Follow up when it is time for your Depo.  3)  Use safe sexual practices (condoms) to prevent STDs.  4)  I will let you know your results from your tests today.  5)  Go to your ultrasound appointment as scheduled.  Prescriptions: FLUCONAZOLE 150 MG TABS (FLUCONAZOLE) Take one tablet by mouth for yeast infection  #1 x 0   Entered and Authorized by:   Bobby Rumpf  MD   Signed by:   Bobby Rumpf  MD on 04/01/2009   Method used:   Electronically to  [facility] [address] [facility]* (retail)       [address] [place], [place]  [address]       Ph: [phone]       Fax: [phone]   RxID:   [id number] FLAGYL 500 MG TABS (METRONIDAZOLE) Take 500 mg by mouth two times a day x 7 days  #14 x 0   Entered and Authorized by:   Bobby Rumpf  MD   Signed by:   Bobby Rumpf  MD on 04/01/2009   Method used:   Electronically to        [facility] [address] #[facility]* (retail)       [address] [place], [place]  [address]       Ph: [phone]       Fax: [phone]   RxID:   [id number]   Laboratory  Results   Urine Tests  Date/Time Received: April 01, 2009 3:28 PM  Date/Time Reported: April 01, 2009 3:55 PM   Routine Urinalysis   Color: yellow Appearance: Clear Glucose: negative   (Normal Range: Negative) Bilirubin: negative   (Normal Range: Negative) Ketone: negative   (Normal Range: Negative) Spec. Gravity: 1.020   (Normal Range: 1.003-1.035) Blood: negative   (Normal Range: Negative) pH: 6.0   (Normal Range: 5.0-8.0) Protein: 30   (Normal Range: Negative) Urobilinogen: 0.2   (Normal Range: 0-1) Nitrite: negative   (Normal Range: Negative) Leukocyte Esterace: trace   (Normal Range: Negative)  Urine Microscopic WBC/HPF: 1-5 RBC/HPF: occ Bacteria/HPF: 1+ Mucous/HPF: 1+ Epithelial/HPF: 1-5    Urine HCG: negative Comments: ...........test performed by...........Marland KitchenTerese Door, CMA  Date/Time Received: April 01, 2009 3:28 PM  Date/Time Reported: April 01, 2009 3:52 PM   [employer] Source: vaginal WBC/hpf: >20 Bacteria/hpf: 3+  Cocci Clue cells/hpf: few  Positive whiff Yeast/hpf: none Trichomonas/hpf: none Comments: cocci and rod bacteria present...........test performed by...........Marland KitchenTerese Door, CMA

## 2010-04-18 NOTE — Progress Notes (Signed)
Summary: Test Res   Phone Note Call from Patient Call back at Home Phone [phone]   Caller: Patient Summary of Call: Pt checking on test results. Initial call taken by: Clydell Hakim,  September 15, 2009 4:09 PM  Follow-up for Phone Call        will forward to Dr. Wallene Huh Follow-up by: Theresia Lo RN,  September 15, 2009 4:15 PM     Appended Document: Test Res pt is returning call  Appended Document: Test Res Patient has been notified of lab results.

## 2010-04-18 NOTE — Assessment & Plan Note (Signed)
Summary: abd pain,df   Vital Signs:  Patient profile:   25 year old female Height:      65 inches Weight:      145 pounds BMI:     24.22 Temp:     98.2 degrees F oral Pulse rate:   93 / minute BP sitting:   125 / 73  (right arm) Cuff size:   regular  Vitals Entered By: Garen Grams LPN (May 25, 6576 1:38 PM) CC: vaginal d/c Is Patient Diabetic? No Pain Assessment Patient in pain? yes     Location: abdomen   Primary Care Provider:  Bobby Rumpf  MD  CC:  vaginal d/c.  History of Present Illness: 1) Vaginal discharge: reports vaginal discharge daily starting about 2 weeks ago. Seen and treated in January for bacterial vaginosis. Discharge is yellow, w/o odor. Denies dysuria (though does report a sensation of "fullness" with urination), dyspareunia, back pain, fever, vaginal lesions, diarrhea, constipation. Reports history of mild pelvic pain intermittenly as well. Has history of PID treated several years ago. Had been referred AGAIN for u/s appointment to evaluate her chronic pelvic pain, but didn't go due to insurance issues. Sexually active, uses condoms sometimes. On Depo currently (LMP 4 years ago; mild spotting 1 day over past two weeks). Last STD w/ negative for GC/CT/HIV/RPR in 10/10; negative for GC/CT in 03/19/09.   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 Does not want to go into detail - she states that she feels very comfortable with talking to me about it but would rather discuss with a psychologist and have me handle her medical issues. Denies daily tearfulness, anhedonia, appetite change, suicidal or homicidal ideation. Reports increased sleeping. History of depression as a teenager - seein in Behavioral Medicine clinic in the past at least 5 years ago.   Habits & Providers  Alcohol-Tobacco-Diet     Tobacco Status:  current  Current Medications (verified): 1)  None  Allergies (verified): No Known Drug Allergies  Physical Exam  General:  alert and cooperative to examination, pleasant and smiling but appears stressed when talking about stressors  Genitalia:  normal introitus, no external lesions, no vaginal discharge, mucosa pink and moist, and no vaginal or cervical lesions.  no cervical motion tendernes, mild TTP areas corresponding to adnexa bilaterally, clear vaginal d/c w/o odor, + mild spotting Psych:  Oriented X3, memory intact for recent and remote, normally interactive, good eye contact, and not anxious appearing., but appears stressed when talking about school    Impression & Recommendations:  Problem # 1:  PELVIC PAIN, CHRONIC (ICD-789.09) Assessment Unchanged  Patient with history of prior PID, now with mild intermittent chronic pelvic pain. Will check pelvis u/s to further eval. REORDERED. Advised patient to try to make it to the ultrasound appointment this time. NSAIDs as needed. GC/CT negative. Upreg negative.   Orders: [facility]- Est  Level 4 (46962)  Problem # 2:  VAGINAL DISCHARGE (ICD-623.5) Assessment: New Will recheck wet prep, GC/Chlamydia. No signs of discharge on exam. Wet prep w/o clue cells, trichomonas or yeast. Will call with results. Advised regarding safe sexual practices. Awaiting GC/Chlamydia results. Handout regarding bacterial vaginosis prevention given.  Orders: [facility] (87591/87491) [facility]  Problem # 3:  FAMILY STRESS (ICD-V61.9) Assessment: Unchanged  Patient reports feeling overwhelmed with stressors, specifically school, taking care of kids. Denies feeling depressed  but feels more stressed. Provided phone number for Dr. Pascal Lux in [facility]. Patient to call regarding scheduling. Denies SI/HI.   Orders: [facility]- Est  Level 4 (60454)  Other Orders: Depo-Provera 150mg  (U9811)  Patient Instructions: 1)  It was great to see you today! 2)  I will  let you know your results.  3)  Call Dr. Pascal Lux at [phone] to set up an appointment to be seen. 4)  Think about healthy ways to help relieve stress and try to put these into place. 5)  Go to the ultrasound appointment as scheduled.   Laboratory Results  Date/Time Received: May 25, 2009 2:25 PM   [employer] Source: VAGINAL WBC/hpf: 0-3 Bacteria/hpf: 3+ Clue cells/hpf: none Yeast/hpf: none Trichomonas/hpf: none Comments: 10-15 RBC's present ...........test performed by...........Marland KitchenTerese Door, CMA     Medication Administration  Injection # 1:    Medication: Depo-Provera 150mg     Diagnosis: CONTRACEPTIVE MANAGEMENT (ICD-V25.09)    Route: IM    Site: LUOQ gluteus    Exp Date: 07/18/2011    Lot #: [id number]    Mfr: greenstone    Comments: May 25 - June 8 for next injection    Patient tolerated injection without complications    Given by: Jone Baseman CMA (May 25, 2009 3:18 PM)  Orders Added: 1)  Wet Prep- [facility] [87210] 2)  GC/Chlamydia-[facility] [87591/87491] 3)  Depo-Provera 150mg  [J1055] 4)  [facility]- Est  Level 4 [86578]

## 2010-04-18 NOTE — Progress Notes (Signed)
   Phone Note Outgoing Call   Summary of Call: DEAR [employer]ITE [employer]AM please call her and tell her the cervicla cultures were NEGATIVE for chlamydia and gonorrhea so her partner does not need ot seek treatment. I called in the flagyl for her BV which is likely whatis causing her vaginal d/c /Thanks!  Denny Levy MD  November 18, 2009 8:29 AM   Follow-up for Phone Call        Spoke with patient and informed her of negative results and rx called in to pharm. Follow-up by: Jimmy Footman, CMA,  November 18, 2009 9:47 AM

## 2010-04-20 NOTE — Progress Notes (Signed)
   Phone Note Call from Patient Call back at [facility] Phone [phone]   Caller: Patient Summary of Call: Pt called with hx of migaines having another headache.  Pt states that she has some neck stiffness, + photophobia no aura.  Pt states it starts from the thneck and moves up to top of her head.  Denies fever, chills, nausea, vomiting, diarrhea or constipation visual changes, shortness of breath, chest pain, loss of sensation or numbness in finger tips or toes.  has not tried taking any medication at this time. Pt does not have family hx of migraines or anyuerism but someone she worked with had an anuerysm and she is nervous.  Told pt that she could try some excedrin or motrin first and lie dwn in a calm dark area or she could be seen by EDP is still worried.  Pt said she will stay at home for now and try it. Given red flags to look out for and when to seek medical attention.  Pt verbalized understanding.  Initial call taken by: Antoine Primas DO,  March 11, 2010 2:05 PM

## 2010-04-20 NOTE — Letter (Signed)
Summary: Service order for outpatient mental health  [facility]  [address]   [place], [place] [address]   Phone: [phone]  Fax: [phone]    02/28/2010  Suzanne Ellis [address] [place], [place]  [address]  To Whom It May Concern:  Let this letter serve as a service order for Suzanne Ellis (DOB: 1986-02-17).  Per her health care providers at the [facility], outpatient therapy is a medically necessary component of her treatment plan.  If you have questions or concerns, please contact us at [phone].           Sincerely,   Spero Geralds PsyD

## 2010-04-20 NOTE — Progress Notes (Signed)
Summary: Abd pain and discharge    Phone Note Call from Patient   Caller: Patient Call For: abd pain and discharge Summary of Call: Pt called to inquire about going to UC for abd pain. This has bee a chronic problem for the pt. Also reports blood tinged vaginal discharge. Non odorous. Has had recurrent vaginal discharge and abd pain. Has dx of PID and chronic abdominal pain.  has been using ibuprofen with mild to moderate improvement in pain. Is wanting to know what she can do about pain and discharge. Pt denies any fevers, chills, dysuria. Discharge has been recurrent/longstanding per pt. Has had intermittent nausea. Told pt that this is a likely recurrent problem as symptoms have been longstanding. Instructed pt that if pain is intractable or if she develops fever to go to UC/ED for eval. Otherwise would wait until monday for evaluation. Suggested use of alternating tylenol and ibuprofen for pain. Pt agreeable to plan.  Doree Albee MD April 01, 2010 9:57 AM

## 2010-04-20 NOTE — Progress Notes (Signed)
   Called patient cell phone [phone] to report negative cervical cultures and negative HIV/RPR. No answer.  Left voicemail message for patient to call our office for results. Paula Compton MD  April 04, 2010 8:39 AM

## 2010-04-20 NOTE — Assessment & Plan Note (Signed)
Summary: Vag discharge/kf   Vital Signs:  Patient profile:   25 year old female Height:      65 inches Weight:      141 pounds BMI:     23.55 Temp:     98.8 degrees F oral Pulse rate:   99 / minute BP sitting:   112 / 74  (left arm) Cuff size:   regular  Vitals Entered By: Tessie Fass CMA (April 03, 2010 2:02 PM) CC: vaginal discharge, abdominal pain Pain Assessment Patient in pain? yes     Location: lower back, abdomen Intensity: 5   Primary Care Provider:  Bobby Rumpf  MD  CC:  vaginal discharge and abdominal pain.  History of Present Illness: Patient here for work-in visit; complains of vaginal discharge with lower pelvic pain for the past 5-6 days.  Began with malodorous pink discharge on Jan 11-12, accompanied by worsening pelvic pain that crescendoed on Jan 14th AM, called doctor on-call.  Has not had fevers, but felt chilled with the height of pain (scored 8-9 on 10-pt pain scale).  Took ibuprofen and came down to a 4 today.  Remains afebrile.   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  Is in sexual relationship with one man, unsure if he is monogamous with her.  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  Last HIV test Oct 2010, negative.  Never had HSV2 that she is aware of.   Denies any medical allergies.  Denies dysuria or skin lesions with this episode of illness.  Has not had intercourse in about 5 weeks.   Allergies: No Known Drug Allergies  Physical Exam  General:  well appearing, no apparent distress Abdomen:  flat, soft and nontender.  Genitalia:  normal ext genitalia, no skin lesions.  Cervix with punctate white lesions, mild mucosal erythema and white purulent discharge from os.  Malodorous.   Bimanual exam with CMT, as well as tenderness on R adnexa.    Impression & Recommendations:  Problem # 1:  VAGINITIS  (ICD-616.10)  Patient with history of PID (1999), as well as prior documented GC/Chlamydia and BV on multiple occasions, here with malodorous vaginal discharge and colicky pelvic pain for the past 5=6 days.  Has not had fevers.  In light of R sided adnexal tenderness with CMT and strawberry cervix, to treat for possible mild PID with Ceftriaxone 250mg  IM today, x1, as well as doxy 100mg  by mouth two times a day and flagyl 500mg  two times a day for 14 days.  Patient consents to an HIV/RPR today.  Will call her with results to cell number [phone].  To keep up to date on DepoProvera as well.   In light of positive yeast (in addition to clue cells on wet prep), will add Diflucan to regimen.  I have discussed reasons for immediate followup, as well as recommendation for followup with Dr. Wallene Huh in the coming 2-3 weeks.  If pelvic pain continues, consider pelvic/TV ultrasound.  The following medications were removed from the medication list:    Zithromax 1 Gm Pack (Azithromycin) ..... Sig all  at one time po Her updated medication list for this problem includes:    Flagyl 500 Mg Tabs (Metronidazole) .Marland Kitchen... 1 by mouth two times a day    Doxycycline Hyclate 100 Mg Tabs (Doxycycline hyclate) .Marland Kitchen... Take 1 tab by mouth two times  a day for 14 days    Metronidazole 500 Mg Tabs (Metronidazole) .Marland Kitchen... Take 1 tab by mouth two times a day for 14 days  Orders: GC/Chlamydia-[facility] (87591/87491) Wet Prep- [facility] ([address]) [facility]- Est Level  3 (60454)  Complete Medication List: 1)  Lamotrigine 25 Mg Tabs (Lamotrigine) .... Take one pill for two weeks in the a.m. and then two pills.  per dr. Kathrynn Running in [facility]. 2)  Flagyl 500 Mg Tabs (Metronidazole) .Marland Kitchen.. 1 by mouth two times a day 3)  Doxycycline Hyclate 100 Mg Tabs (Doxycycline hyclate) .... Take 1 tab by mouth two times a day for 14 days 4)  Metronidazole 500 Mg Tabs (Metronidazole) .... Take 1 tab by mouth two times a day for 14 days 5)  Fluconazole 150 Mg  Tabs (Fluconazole) .... Sig: take 1 tab by mouth one time only, repeat in 1 week as needed  Other Orders: Urinalysis-[facility] (00000) U Preg-[facility] (09811) HIV-[facility] (91478-29562) RPR-[facility] (13086-57846)  Patient Instructions: 1)  It was a pleasure to see you today. We are treating you for a variety of possible infections that could cause cervical pain and discharge, and could cause PID. 2)  It is important that you call us back if you develop fevers, worsening pain, intractable vomiting, or are feeling worse.  I would like you to have a followup appointment with Dr Wallene Huh in the coming 2 to 3 weeks.  3)  Medications: Doxycycline 100mg  tablets, one tablet twice daily for 14 days; metronidazole 500mg  one tablet twice daily for 14 days.  Prescriptions: FLUCONAZOLE 150 MG TABS (FLUCONAZOLE) SIG: Take 1 tab by mouth one time only, repeat in 1 week as needed  #2 x 0   Entered and Authorized by:   Paula Compton MD   Signed by:   Paula Compton MD on 04/03/2010   Method used:   Electronically to        [facility] [address] #[id number]* (retail)       [address] [place], [place]  [address]       Ph: [phone]       Fax: [phone]   RxID:   [id number] METRONIDAZOLE 500 MG TABS (METRONIDAZOLE) Take 1 tab by mouth two times a day for 14 days  #28 x 1   Entered and Authorized by:   Paula Compton MD   Signed by:   Paula Compton MD on 04/03/2010   Method used:   Electronically to        [facility] [address] #[id number]* (retail)       [address] [place], [place]  [address]       Ph: [phone]       Fax: [phone]   RxID:   [id number] DOXYCYCLINE HYCLATE 100 MG TABS (DOXYCYCLINE HYCLATE) Take 1 tab by mouth two times a day for 14 days  #28 x 1   Entered and Authorized by:   Paula Compton MD   Signed by:   Paula Compton MD on 04/03/2010   Method used:   Electronically to        [facility] [address] #[id number]* (retail)       [address] [place], [place]  [address]       Ph:  [phone]       Fax: [phone]   RxID:   [id number]    Orders Added: 1)  Urinalysis-[facility] [00000]  2)  U Preg-[facility] [81025] 3)  GC/Chlamydia-[facility] [87591/87491] 4)  HIV-[facility] [95621-30865] 5)  RPR-[facility] [78469-62952] 6)  Wet Prep- [facility] [87210] 7)  [facility]- Est Level  3 [99213]    Laboratory Results   Urine Tests  Date/Time Received: April 03, 2010 2:30 PM  Date/Time Reported: April 03, 2010 2:54 PM   Routine Urinalysis   Color: yellow Appearance: Clear Glucose: negative   (Normal Range: Negative) Bilirubin: negative   (Normal Range: Negative) Ketone: negative   (Normal Range: Negative) Spec. Gravity: 1.025   (Normal Range: 1.003-1.035) Blood: negative   (Normal Range: Negative) pH: 7.0   (Normal Range: 5.0-8.0) Protein: negative   (Normal Range: Negative) Urobilinogen: 0.2   (Normal Range: 0-1) Nitrite: negative   (Normal Range: Negative) Leukocyte Esterace: negative   (Normal Range: Negative)    Urine HCG: negative Comments: .........Marland Kitchenbiochemical negative; microscopic not indicated ...............test performed by......Marland KitchenBonnie A. [place], MLS (ASCP)cm  Date/Time Received: April 03, 2010 2:30 PM  Date/Time Reported: April 03, 2010 2:56 PM   [employer] Source: vag WBC/hpf: 10-20 Bacteria/hpf: 3+  Cocci Clue cells/hpf: many  Negative whiff Yeast/hpf: moderate Trichomonas/hpf: none Comments: ...............test performed by......Marland KitchenBonnie A. [place], MLS (ASCP)cm    Appended Document: Vag discharge/kf     Allergies: No Known Drug Allergies   Complete Medication List: 1)  Lamotrigine 25 Mg Tabs (Lamotrigine) .... Take one pill for two weeks in the a.m. and then two pills.  per dr. Kathrynn Running in mood disorder clinic. 2)  Flagyl 500 Mg Tabs (Metronidazole) .Marland Kitchen.. 1 by mouth two times a day 3)  Doxycycline Hyclate 100 Mg Tabs (Doxycycline hyclate) .... Take 1 tab by mouth two times a day for 14 days 4)  Metronidazole 500 Mg Tabs (Metronidazole) .... Take 1  tab by mouth two times a day for 14 days 5)  Fluconazole 150 Mg Tabs (Fluconazole) .... Sig: take 1 tab by mouth one time only, repeat in 1 week as needed  Other Orders: Rocephin  250mg  (W4132)   Medication Administration  Injection # 1:    Medication: Rocephin  250mg     Diagnosis: CONTACT OR EXPOSURE TO OTHER VIRAL DISEASES (ICD-V01.79)    Route: IM    Site: RUOQ gluteus    Exp Date: 10/17/2012    Lot #: [id number]    Mfr: hospira    Comments: 250mg  given    Patient tolerated injection without complications    Given by: Tessie Fass CMA (April 03, 2010 3:25 PM)  Orders Added: 1)  Rocephin  250mg  [V2536]

## 2010-05-01 ENCOUNTER — Telehealth: Payer: Self-pay | Admitting: Family Medicine

## 2010-05-01 NOTE — Telephone Encounter (Signed)
Will route to last physician who saw pt for this concern. Dr. Wallene Huh is not available

## 2010-05-01 NOTE — Telephone Encounter (Signed)
Will forward to PCP (or whoever is covering) for referral if they think its appropriate.

## 2010-05-02 ENCOUNTER — Other Ambulatory Visit: Payer: Self-pay | Admitting: Family Medicine

## 2010-05-02 MED ORDER — METRONIDAZOLE 500 MG PO TABS: 500.0000 mg | ORAL_TABLET | Freq: Two times a day (BID) | ORAL | Status: DC

## 2010-05-02 NOTE — Telephone Encounter (Signed)
Chart reviewed.  I recommend extending with another 14-day course of metronidazole oral, then followup in our office afterwards.

## 2010-05-02 NOTE — Telephone Encounter (Signed)
Left message on vm informing patient of below, rx faxed to [facility] on [address].

## 2010-06-05 ENCOUNTER — Inpatient Hospital Stay (INDEPENDENT_AMBULATORY_CARE_PROVIDER_SITE_OTHER)
Admission: RE | Admit: 2010-06-05 | Discharge: 2010-06-05 | Disposition: A | Discharge status: Home / Self Care | Payer: Medicaid Other | Source: Ambulatory Visit | Attending: Family Medicine | Admitting: Family Medicine

## 2010-06-05 DIAGNOSIS — N760 Acute vaginitis: Secondary | ICD-10-CM

## 2010-06-05 LAB — POCT URINALYSIS DIP (DEVICE)
Nitrite: NEGATIVE
Urobilinogen, UA: 0.2 mg/dL (ref 0.0–1.0)

## 2010-06-05 LAB — POCT PREGNANCY, URINE: Preg Test, Ur: NEGATIVE

## 2010-06-05 LAB — WET PREP, GENITAL: Yeast Wet Prep HPF POC: NONE SEEN

## 2010-06-27 ENCOUNTER — Telehealth: Payer: Self-pay | Admitting: Family Medicine

## 2010-06-27 NOTE — Telephone Encounter (Signed)
Suzanne Ellis called to say that she would like to have referral to a Gyn specialist to see what is actually going on with the BV she is constantly having.  She's been taking the meds, not changing anything different in what she eats,wash her clothes or bathe with.  She is quite frustrated and need someone else to evaluate her.  Also will need refill on her meds to be called to [facility] on [address].  Please call her back to discuss this also.

## 2010-07-07 ENCOUNTER — Telehealth: Payer: Self-pay | Admitting: Family Medicine

## 2010-07-07 NOTE — Telephone Encounter (Signed)
Attempted to call patient twice within last week. No answer. Left message on 07/07/10 advising patient to give me a call regarding referral so we can further discuss her symptoms and to see if referral is indeed necessary

## 2010-07-07 NOTE — Telephone Encounter (Signed)
Suzanne Ellis called back and was given msg.  Want provider to call her this weekend, will be able to take call.

## 2010-07-12 ENCOUNTER — Telehealth: Payer: Self-pay | Admitting: Family Medicine

## 2010-07-12 NOTE — Telephone Encounter (Signed)
Suzanne Ellis called to say that she is going to take herself to the ED to have them take some tests to make sure she is not having any internal problems.  She did not see the need to come in if she wasn't going to be referred to a specialist.  She was upset that she keep having the same problem inspite of the meds she has been taking.  Told her I will have you still contact her by phone regarding this.

## 2010-07-18 NOTE — Telephone Encounter (Signed)
Unable to contact patient 4/27 - I cannot call patient on weekends. If patient calls back and I am available I will be happy to discuss this matter with her further.

## 2010-07-20 ENCOUNTER — Other Ambulatory Visit: Payer: Self-pay | Admitting: Family Medicine

## 2010-07-20 ENCOUNTER — Telehealth: Payer: Self-pay | Admitting: Family Medicine

## 2010-07-20 DIAGNOSIS — N760 Acute vaginitis: Secondary | ICD-10-CM

## 2010-07-20 MED ORDER — CLINDAMYCIN HCL 300 MG PO CAPS: 300.0000 mg | ORAL_CAPSULE | Freq: Two times a day (BID) | ORAL | Status: AC

## 2010-07-20 NOTE — Telephone Encounter (Signed)
Pt spoke with Dr Wallene Huh this morning, he prescribed medication but pt says he also excused her from work today & she needs a note faxed to her work at [phone].

## 2010-07-20 NOTE — Telephone Encounter (Signed)
Will have to OK with MD

## 2010-07-20 NOTE — Assessment & Plan Note (Signed)
Will treat with Clindamycin x 7 days. Have patient call back at end of seven days to report symptoms. Consider suppressive therapy with Flagyl.

## 2010-07-21 ENCOUNTER — Encounter: Payer: Self-pay | Admitting: Family Medicine

## 2010-07-21 NOTE — Telephone Encounter (Signed)
Called to inform patient.  She is upset that MD wants to see her before additional letters given, but she is still agreeable to this one.  Pt also states that she needs the letter for Monday not yesterday.  Changed the dates and faxed new letter.

## 2010-07-21 NOTE — Telephone Encounter (Signed)
Please fax letter. If patient calls, please let her know that she should not miss any more days of work, as I will not write further notes unless she comes in for appointment.
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
## 2010-08-04 NOTE — Op Note (Signed)
[place]. [facility]  Patient:    Suzanne Ellis, Suzanne Ellis Visit Number: [id number] MRN: [id number]          Service Type: PED Location: [phone] Attending Physician:  Aron Baba Md Dictated by:   Elisha Ponder, M.D. Proc. Date: 08/03/01 Admit Date:  07/29/2001                             Operative Report  DATE OF BIRTH:  1985/09/02  PREOPERATIVE DIAGNOSIS:  Status post open reduction, internal fixation of right humerus fracture with fasciotomies about the anterior arm and forearm.  POSTOPERATIVE DIAGNOSIS:  Status post open reduction, internal fixation of right humerus fracture with fasciotomies about the anterior arm and forearm.  PROCEDURE:  I&D of fasciotomy sites with partial fasciotomy closure and stress radiography demonstrating excellent plate position.  SURGEON:  Elisha Ponder, M.D.  ASSISTANT:  None.  COMPLICATIONS:  None.  ANESTHESIA:  General LMA anesthetic.  ESTIMATED BLOOD LOSS:  Minimal.  TOURNIQUET TIME:  0.  DRAINS:  None.  COMPLICATIONS:  None.  INDICATIONS FOR THE PROCEDURE:  Suzanne Ellis is a 25 year old female who presents with the above-mentioned diagnosis after counseling in regards to risks, benefits, and surgery, including the risk of infection, bleeding, anesthesia, damage to ______.  With this in mind, she has asked to proceed.  This patient had a very significant hospital course in that she underwent ORIF of fracture, followed by fasciotomy secondary to swelling.  She has done fairly well and has maintained normal neurovascular examination.  She has been onappropriate IV antibiotics and has been improving significantly.  She is 25 years old, and her disposition has been somewhat difficult.  The patient is able to move her fingers nicely and has good refill sensation.  ______ vascularly intact.  I have counseled her and her mother in regards to the operation that she has had and the one to come,  etc.  We have discussed the proposed treatment plan, etc. With this in mind, they desire to proceed.  OPERATIVE FINDINGS:  This patient had a good, clean wound.  No signs of infection.  She still had significant muscle bulging and was only able to undergo partial closure at this juncture.  We washed the wound out and performed stress radiography of the arm and performed partial closure of the fasciotomy distally.  The brachial artery was nicely covered.All compartments remained nice and soft.  She had an excellent radial pulse.  PROCEDURE:  She was taken to the operative suite and underwenta smooth induction of general anesthetic with LMA technique under the direction of Dr. Zoila Shutter.  Following this, she was lain supine.  I removed her splint myself and held the arm for thorough prep and drape.  I examined the arm and noted that all compartments were soft.  She had an excellent radial pulse, and this correlated to her preoperative examination, at which time she was noted to be neurovascularly intact.  Once this was done, I performed an x-ray of the arm, ensuring that the fixation had remained in excellent position and good place.  This was noted to be the case.  A stress radiography was performed, which revealed excellent position of the hardware.  The elbow had a nice, smooth range of motion, and there was no warmth, erythema, or signs of infection present.  At this point in time, the arm was prepped and draped in  the usual sterile fashion with Betadine scrub and paint as directed by myself, and following this, the patient underwent administration of an I&D. This was done by placing antibiotic irrigant in the wound.  The wound was copiously I&Dd without difficulty.  Following I&D and inspection of the compartments, which were all noted to be soft, the patient had the distal forearm incision closed with 3-0 and 4-0 combination Prolene.  This was closed without difficultyor  tension, and compartments remained soft afterwards.  The proximal wound still had biceps bulge from it and was absolutely unable to be attempted closed today. I did feel that this will likely need a skin graft.  I have discussed these issues with the mother and patient postoperatively.  The patient had the biceps covered with Xeroform gauze.  Following this, a sterile dressing was applied.  The wound was copiously I&Dd prior to partial closure and coverage with Xeroform.  Once this was done, the posterior incision was treated with Xeroform gauze.  The arm was washed with saline, and following this, a posterior plaster splint was applied to my satisfaction without difficulty. The application went without problems.  Following this, she was awakened from anesthesia, LMA tube removed, and she was transferred to the recovery room in stable condition.  All sponge and needle counts were reported as correct. There were no complications.We will fully participate in her care.  We will plan for a second I&D in48 hours if possible and skin grafting if necessary. I have discussed with the family all issues. We will continue with pain management, IV antibiotics, elevation, and other precautions.  All questions have been encouraged and answered. Dictated by:   Elisha Ponder, M.D. Attending Physician:  Aron Baba Md DD:  08/03/01 TD:  08/05/01 Job: [id number]

## 2010-08-04 NOTE — Op Note (Signed)
[place]. [facility]  Patient:    EMMALI, KAROW Visit Number: [id number] MRN: [id number]          Service Type: PED Location: [phone] Attending Physician:  Aron Baba Md Dictated by:   Elisha Ponder, M.D. Admit Date:  07/29/2001                             Operative Report  PREOPERATIVE DIAGNOSIS:  Status post right supracondylar humerus fracture open reduction and internal fixation and status post fasciotomies.  The patient presents for irrigation and debridement and closure versus split-thickness skin grafting of fasciotomy sites.  POSTOPERATIVE DIAGNOSIS:  Status post right supracondylar humerus fracture open reduction and internal fixation and status post fasciotomies.  The patient presents for irrigation and debridement and closure versus split-thickness skin grafting of fasciotomy sites.  PROCEDURES: 1. Irrigation and debridement of skin, subcutaneous tissue, muscle tissue,    right upper extremity anterior fasciotomy wounds. 2. Direct closure of fasciotomy sites.  SURGEON:  Elisha Ponder, M.D.  ASSISTANT:  Marcie Bal. Troncale, P.A.C.  ANESTHESIA:  General.  TOURNIQUET TIME:  Zero.  ESTIMATED BLOOD LOSS:  Minimal.  DRAINS:  One eighth-inch Hemovac was placed.  INDICATION FOR PROCEDURE:  Ms. Tsou is a 25 year old female who has her course well-documented in the chart and previous operative notes.  The patient was taken to the operative suite today for I&D and split-thickness skin grafting versus direct closure based upon theintraoperative conditions, etc.  She understands the risks and benefits of surgery, etc.  All questions have been encouraged and answered preoperatively.  OPERATIVE FINDINGS:  The patient had excellent-looking wounds.  There were no signs of infection.  I did not see any signs of tension, all compartments were soft, and she was able to undergo a direct closure as swelling had  subsided significantly.  DESCRIPTION OF PROCEDURE:  The patient was seen by myself and anesthesia, taken to the operating suite.  She underwent smooth induction of general anesthesia under the direction of Maren Beach, M.D.  Following this, she underwent appropriate padding, prepped and draped in the usual sterile fashion about the right thigh and right upper extremity.  I carefully held the arm. The patient had good elbow flexion-extension and pronation-supination on the table.  All wounds looked good including the posterior wound, which was sutured closed.  The anterior wound was without any signs of infection.  The patient had significant decrease in the swelling noted as well.  With this noted, the patient then underwent sterile draping to secure the field.  Following this she underwent an I&D of the skin, subcutaneous tissue, and muscle tissue.  Copious irrigation was applied to the wound.  There were no signs of infection, and all conditions looked well.  I underminedthe subcu fascial plane nicely and following this assessed the extremityfor direct closure.  Direct closure had been accomplished distal to the elbow flexion crease previously and was still soft and looked excellent with normal radial pulse and normal neurovascular exam.  After assessing the intraoperative conditions, I felt that the upper arm was certainly acceptable for direct closure and placed a 3-0 Prolene suture in a far-near,near-far pattern and noted the direct closure went quite easily.  I was very pleased with this and following this, direct closure was accomplished without difficulty over an eighth-inch Hemovac drain.  This was along the six-inch length previous incision.  Following this  the patient had thearm washed, Xeroform was placed over the wound sites, followed by a softdressing and following this, a long-arm plaster splint with a U-shaped splint to prevent rotation.  The patient tolerated this  well.  She had excellent refill and good pulse.  She was extubated and transferred to the recovery room.  She will be monitored, placed on IV antibiotics for 48 hours.  I have discussed with her family all issues.  At the present time she is looking quite well, the arm appears stable, and I have performed serial radiographic exams in the OR of this as well as stability testing perthe examiner myself.  We will monitor her condition closely, look for any signs of infection or other complications and proceed.  I have discussed all issues with her and her family, all questions have been encouraged and answered. Dictated by:   Elisha Ponder, M.D. Attending Physician:  Aron Baba Md DD:  08/05/01 TD:  08/07/01 Job: [phone]
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
## 2010-08-04 NOTE — H&P (Signed)
[place]  Patient:    Suzanne Ellis, Suzanne Ellis Visit Number: [id number] MRN: [id number]          Service Type: GYN Location: [facility] Attending Physician:  Michaelle Copas Dictated by:   Kinnie Scales. Reed Breech, M.D. Admit Date:  05/03/2001 Discharge Date: 05/03/2001   CC:         Raynelle Jan, M.D.   History and Physical  CHIEF COMPLAINT:  Abdominal pain, nausea, and weight loss.  HISTORY OF PRESENT ILLNESS:  This 25 year old, gravida 1, para 1, female with diagnosis of tubo-ovarian abscess and pelvic inflammatory disease diagnosed approximately one month ago is here for follow up after multiple missed visits in the past six to seven weeks.  The patient reports continued intermittently moderately to severe abdominal pain.  When pain is severe, sharp, and crushing in her lower abdomen, it prevents her from doing her usual daily activities including school.  The pain is not responsive to Tylenol or Motrin.  The patient also has been noted by her mother to have lost 10 to 15 pounds over the last two months.  The patient has had this abdominal pain for the past seven to eight months; however, has only had one antibiotic treatment and she did not take this the entire duration either.  PAST MEDICAL HISTORY:  The patient has major recurrent depression, attention deficit with hyperactivity.  History of suicide attempt and oppositional defiant disorder.  MEDICATIONS:  She has been on Depo-Provera in the past.  ALLERGIES:  No known medical allergies.  SOCIAL HISTORY:  Gravida 1, para 1.  She is sexually active currently, using condoms.  She smokes half a pack a day and has used marijuana in the past.  No cocaine or intravenous drug use.  She is only sexually active with males.  She has a three year old daughter.  REVIEW OF SYSTEMS:  Positive nausea, intermittent fevers, no vomiting. Positive abdominal pain and occasional bruising and ecchymoses in the  lower extremities.  The patient has diarrhea often.  No dysuria or hematuria noted. The patient also reports severe sharp, lancinating global headaches in addition to above problems.  PHYSICAL EXAMINATION:  GENERAL:  Pleasant, well-appearing female in no acute distress.  VITAL SIGNS:  Temperature 98.0, blood pressure 110/80.  Weight 120.4 pounds. Pulse is 60.  HEENT:  TMs are not erythematous.  PERRLA.  Extraocular movements intact. Nares not edematous.  Oropharynx shows mucus membranes moist.  NECK:  No lymphadenopathy.  CHEST:  Clear to auscultation bilaterally.  HEART:  Regular rate and rhythm without murmurs.  ABDOMEN:  Tender over right lower quadrant, left lower quadrant, and positive bowel sounds.  No HSM.  PELVIC:  Vaginal exam performed showed normal hair distribution over the mons pubis.  Labia majoris and minoris within normal limits.  No rashes, lesions, or folliculitis noted.  Pink vaginal mucosa with slight odor noted.  Cervix tender with speculum and cervical motion.  There is positive cervical motion tenderness and positive adnexal tenderness.  EXTREMITIES:  No clubbing, cyanosis, or edema.  Normal musculoskeletal strength 5/5 in upper and lower extremities.  LABORATORY DATA:  Wet prep shows moderate clue cells, 2+ bacteria.  ASSESSMENT/PLAN: 1. The patient is to be admitted to [facility] for diagnosis    of abdominal pain and re-evaluation tubo-ovarian abscess.  Since the    patient did have this tubo-ovarian abscess and noncompliant with following    up, it is imperative for intravenous antibiotics to be  initiated to insure    proper care and resolution of the tubo-ovarian abscess.  We will perform    ultrasound, urine pregnancy, urinalysis, urine culture, HIV, and RPR, GC,    and chlamydia to further differentiate any risk factors. 2. FEN:  We will keep her KVO or Hep-Lock her as needed. 3. Infectious disease:  We will treat with triple  therapy gentamycin,    clindamycin, and ampicillin.  If to continue therapy more than three or    four days, to worry about gentamicin ototoxicity and nephrotoxicity.  The    patient will follow up with her primary doctor after discharge. Dictated by:   Kinnie Scales. Reed Breech, M.D. Attending Physician:  Michaelle Copas DD:  05/12/01 TD:  05/12/01 Job: [id number]

## 2010-08-04 NOTE — Op Note (Signed)
[place]  Patient:    Suzanne Ellis, Suzanne Ellis Visit Number: [id number] MRN: [id number]          Service Type: PED Location: 5000 [phone] Attending Physician:  Aron Baba Md Dictated by:   Elisha Ponder, M.D. Proc. Date: 07/31/01 Admit Date:  07/29/2001                             Operative Report  PREOPERATIVE DIAGNOSIS:  Right supercondylar humerus fracture, displaced with comminution.  POSTOPERATIVE DIAGNOSIS:  Right supercondylar humerus fracture, displaced with comminution.   PROCEDURE:  Open reduction and internal fixation rightsupercondylar humerus fracture with combination of butterfly fragments.The ulnar nerve and radial nerve were both explored and identified and protected throughout the case.  SURGEON:  Elisha Ponder, M.D.  ASSISTANT:  Alexzandrew L. Perkins, P.A.-C.  ANESTHESIA:  LMA general anesthesia.  ESTIMATED BLOOD LOSS:  100 cc.  COMPLICATIONS:  None immediate.  DRAINS:  One hemovac.  TOURNIQUET TIME:  Two hours at 250 mmHg.  INDICATIONS:  This patient is a 25 year old female who presents with the above mentioned diagnosed.  I have counseled her in regards to risks and benefits of surgery including risks of infection, bleeding, anesthesia, damage to normal structures, and failure of the surgery to accomplish its intended goals of relieving symptoms and restoring function.  With this in mind, she decided to proceed.  All questions have been encouraged and answered preoperatively.  The patient preoperatively is noted to be quite painful.  She does have intact radial median and ulnar nerves preoperatively to sensation and motor testing. Risks of nerve injury, compartment syndrome, bleeding, infection, anesthesia, damage of normal structures, and failure of surgery to accomplish its intended goals of relieving symptoms and restoring function were discussed with the patient at length.  With this in mind  she desires to proceed.  I have discussed all issues with her mother as well.  FINDINGS:  This patient had a displaced supercondylar humerus fracture. She underwent open reduction and internal fixation with Synthes 3.5 DCP plate and interfragmentary screws.  She had radial and ulnar nerve explored and protected throughout the case.  They were noted to be intact.  I did check her compartment pressures at the conclusion of the case and they were less than 20 mmHg.  DESCRIPTION OF PROCEDURE:  The patient was seen by myself and anesthesia. She was taken to the operating room at which time, general LMA anesthetic was induced under the direction of J. Claybon Jabs, M.D.The patient had appropriate lateral decubitus positioning placed and following this, the patient underwent careful removal of the splint from theright upper extremity.  I evaluated the arm.  The patient did have swelling in the arm over the fracture site, but no blebs.  She had a normal pulse.  The patient then underwent a thorough prep and drape.  After thorough prep and drape with Betadine scrub and paint, the patient then underwent placement of Ioban over the operative site and a sterile tourniquet.  The tourniquet was insufflated at 250 mmHg and a posterior approach to the elbow was made with knife blade through the skin.  Following this, skinflaps were elevated, the triceps was identified and the patient had triceps splitting approach accomplished. The ulnar nerve was found and tracedas well as released throughout its length.  I did not transpose it.  Following this, the muscle splitting approach was used about the  triceps.  It was a very large amount of hematoma noted.  I removed blood clot and debris.  Following this, the dissection proximally occurred until the radial nerve was identified.  I identified this under 4.0 loupe magnification and traced this out.  I protected this at all times and did release it into the  intramuscular septum.  The patient had the radial nerve and ulnar nerve identified, protected at all times, and they were intact throughoutthe case.  Following this, the patient underwent preliminary reduction with towel clamp and Kirschner wire.  Following preliminary reduction witha towel clamp and Kirschner wires, the patient had application of a large locking plate of the 3.5 DCP variety applied to the posterior arm. Interfragmentary screws were placed through the butterfly fragment.  This was done in standard AO technique.  Locking screws were placed proximally and distally.  I did prebend the plate.  I checked this under radiograph to make sure that the patient had excellent placement of the plate.  Following placement of the plate with the locking screws, the placement had x-rays taken which showed excellent reduction.  This plate was placed in standardAO technique.  The patient had full elbow extension and flexion as well as pronation and suppination.  At this point in time, I then took final copy x-rays.  Once this was done, I checked the motion once again, irrigated the wound copiously and then closed the triceps fascia with a drain placed in the area overlying the plate.  The radial nerve sat somewhat in proximity to the plate, but was free and able to move nicely.  The patienthad further irrigation applied to the wound followed by closure of the subcu with 2-0 and 3-0 Vicryl and closure of the skin edge with staple gut.  The patient had Marcaine with epinephrine placed in the subcu at the conclusion of the case and the drain was hooked up to suction.  The triceps fascia of course was decompressed through the surgery.  The anterior compartment of the upper arm was palpated.  I did check compartment pressures with arterial line intraoperatively at this point just to ensure that there was no excessive pressure as there was some swelling palpable.  This did not feel  excessively tight to me, however, I did check the pressureand it was less than 20 mm.  At this point in time, I placed a posteriorplaster splint on after sterile  dressing application.  The patient had excellent radial pulse and was then extubated and taken to the recovery room.  She will be monitored very closely in the recovery room.  I have discussed with her family all issues.  All questions have been encouraged and answered. Dictated by:   Elisha Ponder, M.D. Attending Physician:  Aron Baba Md DD:  07/31/01 TD:  08/04/01 Job: [id number]

## 2010-08-04 NOTE — H&P (Signed)
[facility]  Patient:    Suzanne Ellis, Suzanne Ellis                         MRN: [id number] Adm. Date:  06/03/00 Attending:  Veneta Penton, M.D.                   Psychiatric Admission Assessment  DATE OF ADMISSION:  June 03, 2000  PATIENT IDENTIFICATION:  This 25 year old biracial female was admitted complaining of depression with suicidal ideation and was status post overdose with acetaminophen as a suicide attempt.  HISTORY OF PRESENT ILLNESS:  The patient was stabilized in the emergency department before being transferred to this facility for more definitive psychiatric inpatient stabilization.  She ran away from a group home approximately two weeks ago to return yesterday.  On her return, she took and overdose of acetaminophen.  She was stating she wanted to harm herself.  She complains of a depressed, irritable, and angry mood most of the day nearly every day over the past several months along with anhedonia, giving up on activities previously enjoyed, decreased energy, decreased school performance, decreased concentration, increased fatigue, insomnia, decreased appetite, feelings of hopelessness, helplessness, worthlessness, psychomotor agitation and recurrent thoughts of death.  She denies any homicidal ideation.  PAST PSYCHIATRIC HISTORY:  Overdose two years ago in a previous suicide attempt.  She has presently been in a group home for the past month but had ran away for a period of two weeks.  She previously was seen in outpatient therapy at [facility].  She has a longstanding history of conduct disorder and is presently on probation.  Her legal charges also include truancy, running away, assaultive behavior toward her mother, stealing money, and driving without a license.  SUBSTANCE ABUSE HISTORY:  Urine drug screen was positive for cannabis.  The patient states, however, that she does not use cannabis.  PAST MEDICAL HISTORY:  Pelvic  inflammatory disease two years ago.  She denies any other medical or surgical problems.  She has no known drug allergies or sensitivities.  She is on no medication at the present time.  SOCIAL HISTORY:  The patient has a 47-year-old daughter living with the patients mother.  Father has a history of alcohol and drug dependence.  The patient is currently in the 10th grade but is not attending school.  She has been in four separate school placements in the past three years.  MENTAL STATUS EXAMINATION:  The patient presents as a well-developed, well-nourished, adolescent biracial female who is alert and oriented x 4, psychomotor retarded, disheveled, unkempt with poor hygiene, whose speech is coherent with a decreased rate and volume of speech, increased speech latency. She displays no looseness of associations or evidence of a thought disorder. Affect and mood are depressed and irritable.  She displays poor impulse control.  Immediate recall, short-term memory, and remote memory are intact. Thought processes are goal directed.  ADMISSION DIAGNOSES: Axis I:    1. Major depression, severe without psychosis, recurrent type.            2. Conduct disorder.            3. Cannabis dependence.            4. Rule out polysubstance dependence. Axis II:   1. Antisocial and borderline traits.            2. Rule out personality disorder. Axis III:  None. Axis IV:   Severe.  Axis V:    20 on admission.  ASSETS AND STRENGTHS:  She is well connected into the DSS system.  INITIAL PLAN OF CARE:  Begin the patient on a trial of Celexa once informed consent is obtained and a risks/benefits discussion has been held. Psychotherapy will focus on decreasing potential for self-harm, decreasing cognitive distortions.  A laboratory workup will also be initiated to rule out any medical problems contributing to her symptomatology.  ESTIMATED LENGTH OF STAY:  Five to seven days.  POST HOSPITAL CARE PLAN:   Discharge the patient back to the care of her group home.DD:  06/04/00 TD:  06/04/00 Job: [id number]
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
## 2010-08-04 NOTE — Op Note (Signed)
[place]  Patient:    Suzanne Ellis, Suzanne Ellis Visit Number: [id number] MRN: [id number]          Service Type: PED Location: [phone] Attending Physician:  Aron Baba Md Dictated by:   Elisha Ponder, M.D. Proc. Date: 08/01/01 Admit Date:  07/29/2001                             Operative Report  PREOPERATIVE DIAGNOSIS:  Status post open reduction and internal fixation, right humerus fracture, with postoperative pain out of proportion to expected examination and impending compartment syndrome.  POSTOPERATIVE DIAGNOSIS:  Status post open reductionand internal fixation, right humerus fracture, with postoperative pain out of proportion to expected examination and impending compartment syndrome.  PROCEDURE:  Fasciotomy, right upper extremity and forearm.  SURGEON:  Elisha Ponder, M.D.  ASSISTANT:  Alexzandrew L. Perkins, P.A.-C.  COMPLICATIONS:  None.  ANESTHESIA:  General LMA anesthetic.  ESTIMATED BLOOD LOSS:  Minimal.  INDICATION FOR PROCEDURE:  [name] is a 25 year old female who underwent ORIF of her humerus hours ago.  The patient was seen in the recovery room for a postop check.  She was able to demonstrate sensation to the fingers but did have pain out of proportion to exam and was very tender with palpation ofthe biceps muscle.  Previously she had her compartments measured, which were 20 mmHg or less with arterial line.  This was done at the conclusion of the case.  However, given her age and the pain out of proportion to what would be expected on exam, I felt it necessary to release her compartmentsto rule out any impending ischemia in the arm.  She did have soft tissueswelling and certainly pain that was out of proportion to what would be expected.  I discussed these issues with her mother and discussed with the patient these issues.  I frankly discussed the fact that I feared that the patient  was developing an impending compartment phenomenon, given theexcessive pain. Thus, I wanted to be absolutely sure that the patient did not have excessive pressure.  With this noted and risks and benefits discussed, we went to the operating room for the above-mentioned procedure.  DESCRIPTION OF PROCEDURE:  The patient was seen by myself and anesthesia.  She was placed supine and induced under a general LMA anesthetic. Following this, her arm was carefully prepared by myself.  I held the arm.  We removed the posterior splint.  I palpated the compartments, whichwere notable for swelling in the anterior biceps compartment.  The patient did have a positive pulse during this time.  Following this I then secured a sterile field with Betadine scrub and paint, followed by sterile draping.  Once this was accomplished, the patient then underwent examination of the arm and an outline mark was made for an approach to the anterior upper arm, crossing the elbow flexion crease transversely and extendingdown the forearm medially.  The skin was incised with tourniquet down, hemostasis obtained in the subcutaneous tissue with cautery without difficulty.  Following this I incised the biceps muscle after exposure was created and this bulged.  The fibers were all healthy.  There was no evidenceof necrotic or ischemic muscle.  The patients brachialis was then identified, and I ensured that this was free from any constricting process.  I was pleased with the exam of the biceps and brachialis in that they were completely released.  The deltoid muscle was completely soft.  Following this the transverse incision was opened, and I then released the lacertusfibrosus in the posterior pronator region.  The patient tolerated this well.  The patient had a good pulse.  Thus, with the forearm fascia soft as well as the lacertus fibrosus released and the biceps and brachialis released, I felt very comfortable.  The patient  had a completely soft arm and good pulse.  There was no excessive bleeding.  I then placed Xeroform over portions of the distal wound over the forearm and a V.A.C. dressing over the biceps muscle.  This was placed without difficulty. I placed a small amount of Marcaine 0.25% with epinephrine in the skin edge for postop analgesia.  I then checked the radial pulse multiple times and noted that it was intact without abnormality.  The patient tolerated this operation well.  I gently placed the arm in a posterior plaster splint after padding it nicely.  I once again checked the refill and pulse, which was noted to be excellent.  I was pleased with this and my findings.  At this point in time the patient was extubated and transferred to the recovery room instable condition.  She will be monitored closely.  We will pursue IV antibiotics, pain management according to her needs, ice, and elevation.  I am going to watch her very closely and proceed accordingly.  She will need a washout and a fasciotomy site closure.  All questions have been encouraged and answered. Dictated by:   Elisha Ponder, M.D. Attending Physician:  Aron Baba Md DD:  08/01/01 TD:  08/04/01 Job: [id number]

## 2010-08-24 ENCOUNTER — Inpatient Hospital Stay (INDEPENDENT_AMBULATORY_CARE_PROVIDER_SITE_OTHER)
Admission: RE | Admit: 2010-08-24 | Discharge: 2010-08-24 | Disposition: A | Discharge status: Home / Self Care | Payer: Medicaid Other | Source: Ambulatory Visit | Attending: Emergency Medicine | Admitting: Emergency Medicine

## 2010-08-24 DIAGNOSIS — B9689 Other specified bacterial agents as the cause of diseases classified elsewhere: Secondary | ICD-10-CM

## 2010-08-24 DIAGNOSIS — A499 Bacterial infection, unspecified: Secondary | ICD-10-CM

## 2010-08-24 DIAGNOSIS — N760 Acute vaginitis: Secondary | ICD-10-CM

## 2010-08-24 LAB — POCT URINALYSIS DIP (DEVICE)
Bilirubin Urine: NEGATIVE
Hgb urine dipstick: NEGATIVE
pH: 5 (ref 5.0–8.0)

## 2010-08-24 LAB — WET PREP, GENITAL
Trich, Wet Prep: NONE SEEN
Yeast Wet Prep HPF POC: NONE SEEN

## 2010-08-25 LAB — GC/CHLAMYDIA PROBE AMP, GENITAL
Chlamydia, DNA Probe: NEGATIVE
GC Probe Amp, Genital: NEGATIVE

## 2010-08-30 ENCOUNTER — Telehealth: Payer: Self-pay | Admitting: Family Medicine

## 2010-08-30 NOTE — Telephone Encounter (Signed)
Has had ongoing problem with bacterial infections and the medication is not working and is missing work because of how bad it is.  Patient is really worried and scared and wants someone to call her back before 5:00.

## 2010-08-30 NOTE — Telephone Encounter (Signed)
Called patient back and she states she went ahead and scheduled appointment for tomorrow.

## 2010-08-31 ENCOUNTER — Ambulatory Visit (INDEPENDENT_AMBULATORY_CARE_PROVIDER_SITE_OTHER): Payer: Medicaid Other | Admitting: Family Medicine

## 2010-08-31 ENCOUNTER — Encounter: Payer: Self-pay | Admitting: Family Medicine

## 2010-08-31 VITALS — BP 111/76 | HR 73 | Temp 98.8°F | Ht 65.0 in | Wt 134.0 lb

## 2010-08-31 DIAGNOSIS — R109 Unspecified abdominal pain: Secondary | ICD-10-CM

## 2010-08-31 DIAGNOSIS — N760 Acute vaginitis: Secondary | ICD-10-CM

## 2010-08-31 LAB — POCT WET PREP (WET MOUNT)
Clue Cells Wet Prep HPF POC: NEGATIVE
Trichomonas Wet Prep HPF POC: NEGATIVE

## 2010-08-31 NOTE — Assessment & Plan Note (Signed)
Chronic issue for this patient. Her current problem is an exacerbation of this issue.  Wet prep and GC/CHL obtained but I doubt they will be positive.  I really think her pain may be worsened by some dysfunctional muscular firing.  I also think her psychiatric issues may be worsening her pelvic pain as well.   Plan: Ref[facility] to [facility] at [facility] [address].  Will f/u in 2 weeks.  Red flags reviewed. I am happy to continue seeing this patient next year.

## 2010-08-31 NOTE — Progress Notes (Signed)
Continued pelivc pain. For many years. However it has worsened recently in the past two weeks. She was seen in urgent care two weeks ago with pelvic pain and diagnosed with BV and treated with flagyl. Her pain never really improved and she returned to urgent care 3 days ago with recurrent BV and was re-treated with flagyl and clindamycin cream. Her lower abdominal and pelvic pain is persistent. She notes additionally that her pain is worse with sex. She denies any irregular vaginal bleeding. She has a soft BM almost every day and does not feel constipated. She also denies any diarrhea. No fevers or chills. Is very frustrated with this pain.  She has been treated for BV 14 times with not much success. She has been to GYN clinic where she had a normal ultrasound in March. Never has been to pelvic PT.   PMH reviewed.  ROS as above otherwise neg  Exam:  Vs noted.  Gen: Well NAD Lungs: CTABL Nl WOB Heart: RRR no MRG Abd: NABS, ND, Mild TTP along lower abdomen. Worse on lower right side. No rebound or guarding. Contraction of her abdominal wall does not help very much.  Exts: Non edematous BL  LE GYN: Normal external. Normal introitus. Normal appearing vaginal walls and cervix on speculum exam. On Bi-manual exam no profound cervical motion tenderness, however the whole experience is pretty uncomfortable. No adnexal masses or point tenderness.  When internal hand is reversed the deep hip flexors on right are firm and somewhat tender. Pain not much worsened with resisted hip flexion while palpating deep hip flexors.  Rectal Exam: Normal appearing. No stool in the vault. No cul-de-sac masses. Tenderness and muscle spasm again noted deep and medially. Abduction of the legs is painful while palpating the deep structures.

## 2010-09-01 ENCOUNTER — Ambulatory Visit: Payer: Medicaid Other | Admitting: Family Medicine

## 2010-09-01 LAB — GC/CHLAMYDIA PROBE AMP, GENITAL
Chlamydia, DNA Probe: NEGATIVE
GC Probe Amp, Genital: NEGATIVE

## 2010-11-08 ENCOUNTER — Inpatient Hospital Stay (HOSPITAL_COMMUNITY)
Admission: RE | Admit: 2010-11-08 | Discharge: 2010-11-08 | Disposition: A | Discharge status: Home / Self Care | Payer: Medicaid Other | Source: Ambulatory Visit | Attending: Family Medicine | Admitting: Family Medicine

## 2010-12-26 LAB — POCT URINALYSIS DIP (DEVICE)
Nitrite: NEGATIVE
Urobilinogen, UA: 1
pH: 5.5

## 2010-12-26 LAB — WET PREP, GENITAL
WBC, Wet Prep HPF POC: NONE SEEN
Yeast Wet Prep HPF POC: NONE SEEN

## 2010-12-26 LAB — GC/CHLAMYDIA PROBE AMP, GENITAL
Chlamydia, DNA Probe: NEGATIVE
GC Probe Amp, Genital: NEGATIVE

## 2010-12-27 ENCOUNTER — Inpatient Hospital Stay (HOSPITAL_COMMUNITY): Payer: Medicaid Other

## 2010-12-27 ENCOUNTER — Encounter (HOSPITAL_COMMUNITY): Payer: Self-pay | Admitting: *Deleted

## 2010-12-27 ENCOUNTER — Inpatient Hospital Stay (HOSPITAL_COMMUNITY)
Admission: AD | Admit: 2010-12-27 | Discharge: 2010-12-27 | Disposition: A | Discharge status: Home / Self Care | Payer: Medicaid Other | Source: Ambulatory Visit | Attending: Obstetrics & Gynecology | Admitting: Obstetrics & Gynecology

## 2010-12-27 DIAGNOSIS — O26899 Other specified pregnancy related conditions, unspecified trimester: Secondary | ICD-10-CM

## 2010-12-27 DIAGNOSIS — N760 Acute vaginitis: Secondary | ICD-10-CM | POA: Insufficient documentation

## 2010-12-27 DIAGNOSIS — R1032 Left lower quadrant pain: Secondary | ICD-10-CM | POA: Insufficient documentation

## 2010-12-27 DIAGNOSIS — O2390 Unspecified genitourinary tract infection in pregnancy, unspecified trimester: Secondary | ICD-10-CM | POA: Insufficient documentation

## 2010-12-27 DIAGNOSIS — A499 Bacterial infection, unspecified: Secondary | ICD-10-CM | POA: Insufficient documentation

## 2010-12-27 DIAGNOSIS — R102 Pelvic and perineal pain: Secondary | ICD-10-CM

## 2010-12-27 DIAGNOSIS — O210 Mild hyperemesis gravidarum: Secondary | ICD-10-CM | POA: Insufficient documentation

## 2010-12-27 DIAGNOSIS — N949 Unspecified condition associated with female genital organs and menstrual cycle: Secondary | ICD-10-CM

## 2010-12-27 DIAGNOSIS — B9689 Other specified bacterial agents as the cause of diseases classified elsewhere: Secondary | ICD-10-CM | POA: Insufficient documentation

## 2010-12-27 HISTORY — DX: Other specified health status: Z78.9

## 2010-12-27 LAB — CBC
Hemoglobin: 11.5 g/dL — ABNORMAL LOW (ref 12.0–15.0)
MCH: 28 pg (ref 26.0–34.0)
MCV: 85.2 fL (ref 78.0–100.0)
Platelets: 154 10*3/uL (ref 150–400)
RBC: 4.11 MIL/uL (ref 3.87–5.11)
WBC: 9.8 10*3/uL (ref 4.0–10.5)

## 2010-12-27 LAB — POCT PREGNANCY, URINE: Preg Test, Ur: POSITIVE

## 2010-12-27 LAB — URINALYSIS, ROUTINE W REFLEX MICROSCOPIC
Leukocytes, UA: NEGATIVE
Nitrite: NEGATIVE
Protein, ur: NEGATIVE mg/dL
Specific Gravity, Urine: >1.03 — ABNORMAL HIGH (ref 1.005–1.030)
Urobilinogen, UA: 0.2 mg/dL (ref 0.0–1.0)

## 2010-12-27 LAB — WET PREP, GENITAL: Yeast Wet Prep HPF POC: NONE SEEN

## 2010-12-27 MED ORDER — METRONIDAZOLE 500 MG PO TABS: 500.0000 mg | ORAL_TABLET | Freq: Two times a day (BID) | ORAL | Status: DC

## 2010-12-27 NOTE — Progress Notes (Signed)
Pt states she had 3 HPT's that were POS. Has had a lot of abdominal cramping and nausea.

## 2010-12-27 NOTE — Progress Notes (Signed)
Pt in due to pos upts at home.  Reports abdominal cramping since October 1st, states it comes and goes, but always a dull discomfort.  States pain is worse on left side.  LMP was 11/18/2010.  One day of bleeding this month.

## 2010-12-27 NOTE — ED Provider Notes (Signed)
History   Pt presents today c/o N&V and LLQ pain that has worsened over the past several days. She denies fever but does c/o vag dc. She denies vag bleeding or any other sx at this time.  Chief Complaint  Patient presents with  . Abdominal Pain   HPI  OB History    Grav Para Term Preterm Abortions TAB SAB Ect Mult Living   6 2 2  0 2 2 0 0 0 2      Past Medical History  Diagnosis Date  . No pertinent past medical history     Past Surgical History  Procedure Date  . Dilation and curettage of uterus   . Arm wound repair / closure     No family history on file.  History  Substance Use Topics  . Smoking status: Current Some Day Smoker    Types: Cigarettes  . Smokeless tobacco: Not on file  . Alcohol Use: Yes    Allergies: Allergies not on file  Prescriptions prior to admission  Medication Sig Dispense Refill  . lamoTRIgine (LAMICTAL) 25 MG tablet Take one pill for two weeks in the a.m. and then two pills.  [facility] Dr[facility] Kath[facility].       . metroNIDAZOLE (FLAGYL) 500 MG tablet Take 1 tablet (500 mg total) by mouth 2 (two) times daily.  28 tablet  0    Review of Systems  Constitutional: Negative for fever.  Cardiovascular: Negative for chest pain.  Gastrointestinal: Positive for nausea, vomiting and abdominal pain. Negative for diarrhea and constipation.  Genitourinary: Negative for dysuria, urgency, frequency and hematuria.  Neurological: Negative for dizziness and headaches.  Psychiatric/Behavioral: Negative for depression and suicidal ideas.   Physical Exam   Blood pressure 117/56, pulse 84, temperature 99.7 F (37.6 C), temperature source Oral, resp. rate 16, height 5' 6.5" (1.689 m), weight 135 lb 12.8 oz (61.598 kg), last menstrual period 11/18/2010, SpO2 99.00%.  Physical Exam  Nursing note and vitals reviewed. Constitutional: She is oriented to person, place, and time. She appears well-developed and well-nourished. No distress.  HENT:    Head: Normocephalic and atraumatic.  Eyes: EOM are normal. Pupils are equal, round, and reactive to light.  GI: Soft. She exhibits no distension and no mass. There is tenderness (Tenderness to LLQ on palpation.). There is no rebound and no guarding.  Genitourinary: No bleeding around the vagina. Vaginal discharge found.       Copious amounts of a creamy, white vag dc is present. Uterus palpates to NL size and shape. No adnexal masses. Tenderness to left adnexa.  Neurological: She is alert and oriented to person, place, and time.  Skin: Skin is warm and dry. She is not diaphoretic.  Psychiatric: She has a normal mood and affect. Her behavior is normal. Judgment and thought content normal.    MAU Course  Procedures  Wet prep and GC/Chlamydia cultures done.  Results for orders placed during the hospital encounter of 12/27/10 (from the past 24 hour(s))  URINALYSIS, ROUTINE W REFLEX MICROSCOPIC     Status: Abnormal   Collection Time   12/27/10  6:35 PM      Component Value Range   Color, Urine YELLOW  YELLOW    Appearance HAZY (*) CLEAR    Specific Gravity, Urine >1.030 (*) 1.005 - 1.030    pH 6.0  5.0 - 8.0    Glucose, UA NEGATIVE  NEGATIVE (mg/dL)   Hgb urine dipstick NEGATIVE  NEGATIVE  Bilirubin Urine NEGATIVE  NEGATIVE    Ketones, ur NEGATIVE  NEGATIVE (mg/dL)   Protein, ur NEGATIVE  NEGATIVE (mg/dL)   Urobilinogen, UA 0.2  0.0 - 1.0 (mg/dL)   Nitrite NEGATIVE  NEGATIVE    Leukocytes, UA NEGATIVE  NEGATIVE   POCT PREGNANCY, URINE     Status: Normal   Collection Time   12/27/10  6:52 PM      Component Value Range   Preg Test, Ur POSITIVE    WET PREP, GENITAL     Status: Abnormal   Collection Time   12/27/10  8:30 PM      Component Value Range   Yeast, Wet Prep NONE SEEN  NONE SEEN    Trich, Wet Prep NONE SEEN  NONE SEEN    Clue Cells, Wet Prep FEW (*) NONE SEEN    WBC, Wet Prep HPF POC FEW (*) NONE SEEN   CBC     Status: Abnormal   Collection Time   12/27/10  8:57 PM       Component Value Range   WBC 9.8  4.0 - 10.5 (K/uL)   RBC 4.11  3.87 - 5.11 (MIL/uL)   Hemoglobin 11.5 (*) 12.0 - 15.0 (g/dL)   HCT 16.1 (*) 09.6 - 46.0 (%)   MCV 85.2  78.0 - 100.0 (fL)   MCH 28.0  26.0 - 34.0 (pg)   MCHC 32.9  30.0 - 36.0 (g/dL)   RDW 04.5  40.9 - 81.1 (%)   Platelets 154  150 - 400 (K/uL)   US shows single IUP with yolk sac. Assessment and Plan  IUP: discussed with pt at length. She will begin prenatal care. Discussed diet, activity, risks, and precautions.  BV: discussed with pt at length. Will tx with Flagyl. Warned of antabuse reaction.   Clinton Gallant. Alfonzo Arca III, DrHSc, MPAS, PA-C  12/27/2010, 8:35 PM   Henrietta Hoover, PA 12/27/10 [id number]

## 2010-12-28 ENCOUNTER — Ambulatory Visit: Payer: Medicaid Other | Admitting: Family Medicine

## 2011-01-22 ENCOUNTER — Encounter: Payer: Self-pay | Admitting: Family Medicine

## 2011-01-22 ENCOUNTER — Ambulatory Visit (INDEPENDENT_AMBULATORY_CARE_PROVIDER_SITE_OTHER): Payer: Self-pay | Admitting: Family Medicine

## 2011-01-22 DIAGNOSIS — Z331 Pregnant state, incidental: Secondary | ICD-10-CM

## 2011-01-22 NOTE — Progress Notes (Signed)
  Subjective:    Patient ID: Suzanne Ellis, female    DOB: [birth year], 25 y.o.   MRN: [id number]  HPI Unplanned pregnancy: Patient states she is pregnant and does not desire pregnancy. Patient was on Depo-Provera for possibly 6 years stopped in March 2012 due to side effects. States that she did not like the way change her body. Did not use condoms while sexually active. Now approximately [redacted] weeks pregnant. Patient states that she would like an abortion. Has had an abortion in the past at 13 weeks. Patient is tearful when talking about this topic. States that she does not want to give up the child for adoption. And does not think that she wants to keep it.   Review of Systems No vaginal bleeding. No vaginal discharge. No fever. No abdominal pain.    Objective:   Physical Exam  Constitutional: She is oriented to person, place, and time. She appears well-developed and well-nourished.  Cardiovascular: Normal rate.   Pulmonary/Chest: Effort normal. No respiratory distress.  Abdominal: Soft. She exhibits no distension. There is no tenderness.  Neurological: She is alert and oriented to person, place, and time.  Skin: No rash noted.  Psychiatric: She has a normal mood and affect. Her behavior is normal.       Tearful.          Assessment & Plan:

## 2011-01-25 DIAGNOSIS — Z3480 Encounter for supervision of other normal pregnancy, unspecified trimester: Secondary | ICD-10-CM | POA: Insufficient documentation

## 2011-01-25 NOTE — Assessment & Plan Note (Signed)
Patient states she is undecided at this time regarding her plan for this pregnancy. Patient given information on elective abortion. Also discussed the options of adoption and continuing the pregnancy. Patient states that she will think about this and discuss with her partner. If she decides to continue the pregnancy she will make a followup appointment for new OB appointment. If she decides to have an elective abortion will contact one of the clinics at offers that service.

## 2011-02-21 ENCOUNTER — Inpatient Hospital Stay (HOSPITAL_COMMUNITY)
Admission: AD | Admit: 2011-02-21 | Discharge: 2011-02-21 | Disposition: A | Discharge status: Home / Self Care | Payer: Medicaid Other | Source: Ambulatory Visit | Attending: Obstetrics and Gynecology | Admitting: Obstetrics and Gynecology

## 2011-02-21 ENCOUNTER — Encounter (HOSPITAL_COMMUNITY): Payer: Self-pay | Admitting: *Deleted

## 2011-02-21 DIAGNOSIS — O0930 Supervision of pregnancy with insufficient antenatal care, unspecified trimester: Secondary | ICD-10-CM | POA: Insufficient documentation

## 2011-02-21 DIAGNOSIS — M5410 Radiculopathy, site unspecified: Secondary | ICD-10-CM

## 2011-02-21 DIAGNOSIS — M545 Low back pain, unspecified: Secondary | ICD-10-CM | POA: Insufficient documentation

## 2011-02-21 DIAGNOSIS — IMO0002 Reserved for concepts with insufficient information to code with codable children: Secondary | ICD-10-CM | POA: Diagnosis not present

## 2011-02-21 DIAGNOSIS — B9689 Other specified bacterial agents as the cause of diseases classified elsewhere: Secondary | ICD-10-CM | POA: Insufficient documentation

## 2011-02-21 DIAGNOSIS — A499 Bacterial infection, unspecified: Secondary | ICD-10-CM | POA: Insufficient documentation

## 2011-02-21 DIAGNOSIS — O2390 Unspecified genitourinary tract infection in pregnancy, unspecified trimester: Secondary | ICD-10-CM | POA: Insufficient documentation

## 2011-02-21 DIAGNOSIS — N760 Acute vaginitis: Secondary | ICD-10-CM | POA: Insufficient documentation

## 2011-02-21 LAB — URINALYSIS, ROUTINE W REFLEX MICROSCOPIC
Glucose, UA: NEGATIVE mg/dL
Hgb urine dipstick: NEGATIVE
Leukocytes, UA: NEGATIVE
Specific Gravity, Urine: >1.03 — ABNORMAL HIGH (ref 1.005–1.030)
Urobilinogen, UA: 0.2 mg/dL (ref 0.0–1.0)

## 2011-02-21 LAB — WET PREP, GENITAL

## 2011-02-21 MED ORDER — METRONIDAZOLE 500 MG PO TABS: 500.0000 mg | ORAL_TABLET | Freq: Two times a day (BID) | ORAL | Status: AC

## 2011-02-21 MED ORDER — ACETAMINOPHEN 500 MG PO TABS
1000.0000 mg | ORAL_TABLET | Freq: Once | ORAL | Status: AC
  Administered 2011-02-21: 1000 mg via ORAL
  Filled 2011-02-21: qty 2

## 2011-02-21 MED ORDER — FLUCONAZOLE 150 MG PO TABS: 150.0000 mg | ORAL_TABLET | Freq: Once | ORAL | Status: AC

## 2011-02-21 MED ORDER — CYCLOBENZAPRINE HCL 5 MG PO TABS: 5.0000 mg | ORAL_TABLET | Freq: Three times a day (TID) | ORAL | Status: AC | PRN

## 2011-02-21 MED ORDER — OXYCODONE-ACETAMINOPHEN 5-325 MG PO TABS: 1.0000 | ORAL_TABLET | ORAL | Status: AC | PRN

## 2011-02-21 MED ORDER — METRONIDAZOLE 500 MG PO TABS: 500.0000 mg | ORAL_TABLET | Freq: Once | ORAL | Status: DC

## 2011-02-21 NOTE — Progress Notes (Signed)
Pt complains of left mid back pain that radiates to lower back and down left leg. Lower abd "contractions". Reports dysuria. Nausea but no vomiting. Denies fever. Seen in [facility]U in October but has not started care yet.

## 2011-02-21 NOTE — Progress Notes (Signed)
Pharmacy called to ask if pt was being admitted for flagyl order. provider intended for order to be prescribed at home.  Does not want first dose given here.  Ok to [employer] home without po flagyl here.

## 2011-02-21 NOTE — Progress Notes (Signed)
Pt presents for lower abd pain mainly on left side.  Feels like contractions. Has been having cramping for a week now.  Has pink discharge.  Has pain down left side in butt and leg.  Has been having the back pain for "a while now".  Feels like she can't get up with the pain.  Hasn't started prenatal care yet.

## 2011-02-21 NOTE — Progress Notes (Signed)
SSE per CNM.  Wet prep and cultures collected.  VE done.  

## 2011-02-21 NOTE — ED Provider Notes (Signed)
History     Chief Complaint  Patient presents with  . Back Pain   HPI   G6 P2022 at 13 weeks 4 days with NPC. 2-3 week history of left lumbar sacral back pain that radiates to the left buttock and left posterior thigh. It's worse with walking. She denies any numbness or weakness. No loss of sensation. No previous episode. No injury.  She also has some pink spotting which has been going on for about a week. She was seen here in October and treated for BV. She did not complete the Flagyl. At that time wet prep showed few clue cells and GC chlamydia were negative. She has not been sexually active since then. Denies any irritative vaginal discharge.for the past couple of weeks she's also been having crampy lower abdominal pain and groin pain.   Past Medical History  Diagnosis Date  . No pertinent past medical history     Past Surgical History  Procedure Date  . Dilation and curettage of uterus   . Arm wound repair / closure   . Termination     Family History  Problem Relation Age of Onset  . Diabetes Mother   . Hypertension Mother   . Diabetes Father   . Hypertension Father     History  Substance Use Topics  . Smoking status: Current Some Day Smoker -- 1.0 packs/day    Types: Cigarettes  . Smokeless tobacco: Not on file  . Alcohol Use: Yes    Allergies: No Known Allergies  Prescriptions prior to admission  Medication Sig Dispense Refill  . ibuprofen (ADVIL,MOTRIN) 200 MG tablet Take 200 mg by mouth every 6 (six) hours as needed. Back pain       . lamoTRIgine (LAMICTAL) 25 MG tablet Take one pill for two weeks in the a.m. and then two pills.  Per Dr[facility] Kathrynn [facility].       Marland Kitchen DISCONTD: metroNIDAZOLE (FLAGYL) 500 MG tablet Take 1 tablet (500 mg total) by mouth 2 (two) times daily.  28 tablet  0  . DISCONTD: metroNIDAZOLE (FLAGYL) 500 MG tablet Take 1 tablet (500 mg total) by mouth 2 (two) times daily.  14 tablet  0    Review of Systems  Constitutional:  Negative for fever, chills and malaise/fatigue.  Gastrointestinal: Positive for abdominal pain. Negative for nausea, vomiting, diarrhea and constipation.  Genitourinary: Negative for dysuria, urgency and frequency.  Musculoskeletal: Positive for back pain.  Neurological: Negative for headaches.   Physical Exam   Blood pressure 118/68, pulse 110, temperature 99.3 F (37.4 C), resp. rate 18, height 5\' 5"  (1.651 m), weight 55.792 kg (123 lb), last menstrual period 11/18/2010, SpO2 99.00%.  Physical Exam  Constitutional: She is oriented to person, place, and time. She appears well-developed and well-nourished. She appears distressed.  HENT:  Head: Normocephalic.  Neck: Normal range of motion. Neck supple.  Cardiovascular: Normal rate.   Respiratory: Effort normal.  GI: Soft. There is tenderness.  Genitourinary: Uterus normal. Vaginal discharge found.       No blood. White discharge. Cx L/C/H  Musculoskeletal: Normal range of motion. She exhibits tenderness.       Tender to palpation left lumbar sacral region and buttock and sciatic notch. Leg raise does not elicit pain. No sensory or motor deficit.  Neurological: She is alert and oriented to person, place, and time. She has normal reflexes. She displays normal reflexes. No cranial nerve deficit. Coordination normal.  Skin: Skin is warm and dry.  Psychiatric: She has a normal mood and affect.    MAU Course  Procedures  Results for orders placed during the hospital encounter of 02/21/11 (from the past 24 hour(s))  URINALYSIS, ROUTINE W REFLEX MICROSCOPIC     Status: Abnormal   Collection Time   02/21/11  7:57 PM      Component Value Range   Color, Urine YELLOW  YELLOW    APPearance CLEAR  CLEAR    Specific Gravity, Urine >1.030 (*) 1.005 - 1.030    pH 6.0  5.0 - 8.0    Glucose, UA NEGATIVE  NEGATIVE (mg/dL)   Hgb urine dipstick NEGATIVE  NEGATIVE    Bilirubin Urine NEGATIVE  NEGATIVE    Ketones, ur 15 (*) NEGATIVE (mg/dL)    Protein, ur NEGATIVE  NEGATIVE (mg/dL)   Urobilinogen, UA 0.2  0.0 - 1.0 (mg/dL)   Nitrite NEGATIVE  NEGATIVE    Leukocytes, UA NEGATIVE  NEGATIVE   WET PREP, GENITAL     Status: Abnormal   Collection Time   02/21/11 10:00 PM      Component Value Range   Yeast, Wet Prep NONE SEEN  NONE SEEN    Trich, Wet Prep NONE SEEN  NONE SEEN    Clue Cells, Wet Prep MODERATE (*) NONE SEEN    WBC, Wet Prep HPF POC TOO NUMEROUS TO COUNT (*) NONE SEEN     Assessment and Plan  1.Radicular lumbar sacral low back pain. Will treat with acetaminophen alternating with ibuprofen. Rx Percocet #10 are given for breakthrough pain. RX Flexeril for concomittant muscle spasm 2. BV - Flagyl RX  Tomer Chalmers 02/21/2011, 9:38 PM

## 2011-02-21 NOTE — Progress Notes (Signed)
D. Poe, CNM at bedside.  Assessment done and poc discussed with pt.  

## 2011-02-22 LAB — GC/CHLAMYDIA PROBE AMP, GENITAL
Chlamydia, DNA Probe: POSITIVE — AB
GC Probe Amp, Genital: NEGATIVE

## 2011-02-23 ENCOUNTER — Ambulatory Visit (INDEPENDENT_AMBULATORY_CARE_PROVIDER_SITE_OTHER): Payer: Medicaid Other | Admitting: *Deleted

## 2011-02-23 ENCOUNTER — Telehealth: Payer: Self-pay | Admitting: *Deleted

## 2011-02-23 DIAGNOSIS — A568 Sexually transmitted chlamydial infection of other sites: Secondary | ICD-10-CM

## 2011-02-23 MED ORDER — AZITHROMYCIN 1 G PO PACK
1.0000 g | PACK | Freq: Once | ORAL | Status: AC
  Administered 2011-02-23: 1 g via ORAL

## 2011-02-23 NOTE — Telephone Encounter (Signed)
Patient seen at MAU for back pain.  They did STD check and patient tested positive for Chlamydia.  Contacted Dr. Edmonia James who gave the order for her to receive Azythromycin 1 gram po.  Made an appointment in nurse clinic this afternoon for the treatment.

## 2011-03-05 ENCOUNTER — Encounter: Payer: Self-pay | Admitting: Family Medicine

## 2011-03-05 ENCOUNTER — Other Ambulatory Visit (HOSPITAL_COMMUNITY)
Admission: RE | Admit: 2011-03-05 | Discharge: 2011-03-05 | Disposition: A | Discharge status: Home / Self Care | Payer: Medicaid Other | Source: Ambulatory Visit | Attending: Family Medicine | Admitting: Family Medicine

## 2011-03-05 ENCOUNTER — Ambulatory Visit (INDEPENDENT_AMBULATORY_CARE_PROVIDER_SITE_OTHER): Payer: Medicaid Other | Admitting: Family Medicine

## 2011-03-05 VITALS — BP 117/79 | Wt 126.0 lb

## 2011-03-05 DIAGNOSIS — Z3480 Encounter for supervision of other normal pregnancy, unspecified trimester: Secondary | ICD-10-CM | POA: Diagnosis present

## 2011-03-05 DIAGNOSIS — Z3490 Encounter for supervision of normal pregnancy, unspecified, unspecified trimester: Secondary | ICD-10-CM | POA: Insufficient documentation

## 2011-03-05 DIAGNOSIS — Z01419 Encounter for gynecological examination (general) (routine) without abnormal findings: Secondary | ICD-10-CM | POA: Insufficient documentation

## 2011-03-05 LAB — POCT WET PREP (WET MOUNT)

## 2011-03-05 MED ORDER — PRENATAL RX 60-1 MG PO TABS: 1.0000 | ORAL_TABLET | Freq: Every day | ORAL | Status: AC

## 2011-03-05 NOTE — Patient Instructions (Addendum)
It was nice meeting you today.  I would like for you to start taking prenatal vitamins, I have sent a prescription over to your pharmacy. I would like to see you back in 4 weeks to follow up with me I will go ahead and order an ultrasound to look at baby's anatomy We will do your genetic screening when you return in 4 weeks.   You may start to feel baby move in the coming weeks as well Remember if you have any cramping, bleeding, or discharge please call our office or go to [facility]. Call with any questions.

## 2011-03-06 LAB — OBSTETRIC PANEL
Antibody Screen: NEGATIVE
Basophils Relative: 0 % (ref 0–1)
Eosinophils Absolute: 0.1 10*3/uL (ref 0.0–0.7)
Eosinophils Relative: 1 % (ref 0–5)
Hemoglobin: 10.4 g/dL — ABNORMAL LOW (ref 12.0–15.0)
Hepatitis B Surface Ag: NEGATIVE
Lymphs Abs: 2.3 10*3/uL (ref 0.7–4.0)
MCH: 27.1 pg (ref 26.0–34.0)
MCHC: 32.5 g/dL (ref 30.0–36.0)
MCV: 83.3 fL (ref 78.0–100.0)
Monocytes Absolute: 0.7 10*3/uL (ref 0.1–1.0)
Monocytes Relative: 7 % (ref 3–12)
Neutrophils Relative %: 70 % (ref 43–77)
Rh Type: POSITIVE

## 2011-03-06 LAB — HIV ANTIBODY (ROUTINE TESTING W REFLEX): HIV: NONREACTIVE

## 2011-03-07 LAB — CULTURE, OB URINE
Colony Count: NO GROWTH
Organism ID, Bacteria: NO GROWTH

## 2011-03-19 ENCOUNTER — Encounter: Payer: Self-pay | Admitting: Family Medicine

## 2011-03-19 NOTE — Progress Notes (Signed)
-Suzanne Ellis here for initial OB visit.  Had positive pregnancy test at [facility] along with ultrasound showing GA of 67w4days on 12/29/10 making her 15w2 days today.   Had recent diagnosis of chlamydia and received treatment at [facility].  Denies discharge at this time.  -She reports this pregnancy was undesired at this time, but good support from father of baby who accompanies her today. -Working on getting medicaid -Interested in genetic screening -Drinks EtOH occasionally and still smoking -Would like to have genetic screening done  BP 117/79  Wt 126 lb (57.153 kg)  LMP 11/18/2010  Breastfeeding? Unknown General appearance: alert, cooperative and no distress Head: Normocephalic, without obvious abnormality, atraumatic Ears: normal TM's and external ear canals both ears Throat: lips, mucosa, and tongue normal; teeth and gums normal Neck: no adenopathy, supple, symmetrical, trachea midline and thyroid not enlarged, symmetric, no tenderness/mass/nodules Lungs: clear to auscultation bilaterally Heart: regular rate and rhythm, S1, S2 normal, no murmur, click, rub or gallop Abdomen: soft, non-tender; bowel sounds normal; no masses,  no organomegaly Pelvic: cervix normal in appearance, external genitalia normal, no adnexal masses or tenderness, no cervical motion tenderness, rectovaginal septum normal and vagina normal without discharge Extremities: extremities normal, atraumatic, no cyanosis or edema Pulses: 2+ and symmetric Skin: Skin color, texture, turgor normal. No rashes or lesions  [facility] Pregnancy Home Risk Screening Form  1. Thinking back to just before you got pregnant how did you feel about becoming pregnant? []   I wanted to be pregnant sooner. []   I wanted to be pregnant now. []   I wanted to be pregnant later. [x]   I did not want to be pregnant then or any time in the future []   I don't know  2.  Within the last year, have you been hit, slapped, kicked or otherwise physically hurt by someone?   No  3.  Are you in a relationship with a person who threatens or physically hurts you? No  4.  Has anyone forced you to have sexual activities that made you feel uncomfortable? No  5.  In the last 12 months were you ever hungry but didn't east because you couldn't afford enough food?  No  6.  Is your living situation unsafe or unstabe?  No  7.  Which statement best describes your smoking status?   []   I have never smoked, or have smoked less than 100 cigarettes in my lifetime []   I stopped smoking BEFORE I found out I was pregnant and am not smoking now []   I stopped smoking AFTER I found out I was pregnant and am not smoking now []   I smoke now but have cut down some since I found out I was pregnant [x]   I smoke about the same amount now as I did before I found out I was pregnant  8.  Did any of your parents have a problem with alcohol or other drug use? No  9.  Do any of your friends have a problem with alcohol or other drug use? No  10.  Does your partner have a problem with alcohol or other drug use? No  11.  In the past, have you had difficulties in your life due to alcohol or other drugs, including prescription medications? No  12.  Before you knew you were pregnant, how often did you drink any alcohol, including beer or wine, or use other drugs?  []   Not at all []   Rarely []   Sometimes [x]   Frequently  13.  In the past month, how often did you drink any alcohol, including beer or wine, or use another drug? []   Not at all [x]   Rarely []   Sometimes []   Frequently  PHQ: 9  Assessment and Plan -Initial OB labs, OB urine culture, HIV, Sickle Cell screen -GC/Chlamydia for test of cure -PAP done -Strongly advised to start prenatal vitamin and stop smoking and using EtOH -No risk factors for early glucola -Genetic screening (Quad screen) and [place] for anatomy to be ordered at next visit in 4 weeks

## 2011-03-20 NOTE — L&D Delivery Note (Signed)
Delivery Note At 9:43 AM a viable female was delivered via Vaginal, Spontaneous Delivery (Presentation: Right Occiput Anterior).  APGAR, weight pending.   Placenta status: Intact, Spontaneous.  Cord: 3 vessels with the following complications: None.   Anesthesia: Epidural  Episiotomy: None Lacerations: None Est. Blood Loss (mL): 200  Mom to postpartum.  Baby to nursery-stable.  Clancy Gourd 08/30/2011, 10:00 AM   Supervised by Dr. Adrian Blackwater  I precepted this delivery.  I agree with the above note.  Levie Heritage, DO 08/31/2011

## 2011-03-29 ENCOUNTER — Ambulatory Visit (INDEPENDENT_AMBULATORY_CARE_PROVIDER_SITE_OTHER): Payer: Medicaid Other | Admitting: Family Medicine

## 2011-03-29 VITALS — BP 125/69 | Wt 123.0 lb

## 2011-03-29 DIAGNOSIS — Z331 Pregnant state, incidental: Secondary | ICD-10-CM | POA: Diagnosis not present

## 2011-03-29 NOTE — Patient Instructions (Signed)
Thank you for coming in today. We will set up your ultrasound.  Come back in 1 month.  Continue the prenatal vitamins.  Try some OTC yeast infection treatment.  Let me know if that does not work.

## 2011-03-29 NOTE — Progress Notes (Signed)
Mr. Suzanne Ellis is a 26 year old G5 P2 0-2 here for OB visit at 18 weeks and 5 days. She has a confirmed gestational age based on a 5 week ultrasound at [facility] hospital.  She feels well however does note some vaginal itching consistent with prior diagnosis of yeast infection. She has not tried any over-the-counter therapy at this point. She is currently taking prenatal vitamins but is occasionally smoking drinking alcohol.  She feels well otherwise.  Exam:  BP 125/69  Wt 123 lb (55.792 kg)  LMP 11/18/2010  Breastfeeding? Unknown Gen: Well NAD HEENT: EOMI,  MMM Lungs: CTABL Nl WOB Heart: RRR no MRG Abd: NABS, NT, ND uterus at approximately 18 cm. Exts: Non edematous BL  LE, warm and well perfused.   Assessment and plan: 73 her old G5 P2 0-2 at 18 weeks and 5 days.  Patient desires quad screen which will be performed today.  Additionally prenatal ultrasound ordered today.  Recommend that she initiate over-the-counter topical yeast infection therapy with followup in one month.  If no improvement will prescribe fluconazole.   She expresses understanding.

## 2011-04-04 ENCOUNTER — Ambulatory Visit (HOSPITAL_COMMUNITY)
Admission: RE | Admit: 2011-04-04 | Discharge: 2011-04-04 | Disposition: A | Discharge status: Home / Self Care | Payer: Medicaid Other | Source: Ambulatory Visit | Attending: Family Medicine | Admitting: Family Medicine

## 2011-04-04 DIAGNOSIS — Z363 Encounter for antenatal screening for malformations: Secondary | ICD-10-CM | POA: Insufficient documentation

## 2011-04-04 DIAGNOSIS — O358XX0 Maternal care for other (suspected) fetal abnormality and damage, not applicable or unspecified: Secondary | ICD-10-CM | POA: Insufficient documentation

## 2011-04-04 DIAGNOSIS — Z331 Pregnant state, incidental: Secondary | ICD-10-CM

## 2011-04-04 DIAGNOSIS — Z1389 Encounter for screening for other disorder: Secondary | ICD-10-CM | POA: Insufficient documentation

## 2011-04-12 ENCOUNTER — Ambulatory Visit: Payer: Self-pay | Admitting: Emergency Medicine

## 2011-04-26 ENCOUNTER — Ambulatory Visit (INDEPENDENT_AMBULATORY_CARE_PROVIDER_SITE_OTHER): Payer: Self-pay | Admitting: Family Medicine

## 2011-04-26 DIAGNOSIS — Z3480 Encounter for supervision of other normal pregnancy, unspecified trimester: Secondary | ICD-10-CM

## 2011-04-26 NOTE — Progress Notes (Signed)
Mr. Suzanne Ellis is a 26 year old G5 P2 0-2 here for OB visit at 22 weeks and 5 days. She has a confirmed gestational age based on a 5 week ultrasound at [facility] hospital.  She had a 20 week ultrasound in the interim that was normal.    She is currently taking prenatal vitamins but is smoking 1ppd and occ drinking alcohol.  She feels well otherwise. She is ready to quit tobacco but does not think that she will be successful.   Exam:  BP 126/77  Wt 126 lb (57.153 kg)  LMP 11/18/2010  Breastfeeding? Unknown Gen: Well NAD HEENT: EOMI,  MMM Lungs: CTABL Nl WOB Heart: RRR no MRG Abd: NABS, NT, ND uterus at approximately 21 cm. Exts: Non edematous BL  LE, warm and well perfused.   Assessment and plan: 66 her old G5 P2 0-2 at 22weeks and 5 days.   1) Gestation: Doing well. F/u in 4 weeks.  2) DM screening: 1 hr GTT next visit.  3) Smoking: Advised patches and gum and follow up.

## 2011-04-26 NOTE — Patient Instructions (Signed)
Thank you for coming in today. Please continue the prenatal vitamins.  Please quit smoking. You can use the patch and the gum.  See me in 4 weeks.  Please schedule your sugar test between now and the next visit.  Let me know if you have bleeding or contractions.

## 2011-05-23 ENCOUNTER — Encounter: Payer: Medicaid Other | Admitting: Family Medicine

## 2011-05-24 ENCOUNTER — Ambulatory Visit (INDEPENDENT_AMBULATORY_CARE_PROVIDER_SITE_OTHER): Payer: Self-pay | Admitting: Family Medicine

## 2011-05-24 ENCOUNTER — Telehealth: Payer: Self-pay | Admitting: Family Medicine

## 2011-05-24 VITALS — BP 111/71 | Wt 129.0 lb

## 2011-05-24 DIAGNOSIS — O368190 Decreased fetal movements, unspecified trimester, not applicable or unspecified: Secondary | ICD-10-CM

## 2011-05-24 DIAGNOSIS — Z3480 Encounter for supervision of other normal pregnancy, unspecified trimester: Secondary | ICD-10-CM

## 2011-05-24 NOTE — Patient Instructions (Addendum)
We will call you with your appointment for ultrasound and NST.  Round Ligament Pain The round ligament is made up of muscle and fibrous tissue. It is attached to the uterus near the fallopian tube. The round ligament is located on both sides of the uterus and helps support the position of the uterus. It usually begins in the second trimester of pregnancy when the uterus comes out of the pelvis. The pain can come and go until the baby is delivered. Round ligament pain is not a serious problem and does not cause harm to the baby. CAUSE During pregnancy the uterus grows the most from the second trimester to delivery. As it grows, it stretches and slightly twists the round ligaments. When the uterus leans from one side to the other, the round ligament on the opposite side pulls and stretches. This can cause pain. SYMPTOMS  Pain can occur on one side or both sides. The pain is usually a short, sharp, and pinching-like. Sometimes it can be a dull, lingering and aching pain. The pain is located in the lower side of the abdomen or in the groin. The pain is internal and usually starts deep in the groin and moves up to the outside of the hip area. Pain can occur with:  Sudden change in position like getting out of bed or a chair.   Rolling over in bed.   Coughing or sneezing.   Walking too much.   Any type of physical activity.  DIAGNOSIS  Your caregiver will make sure there are no serious problems causing the pain. When nothing serious is found, the symptoms usually indicate that the pain is from the round ligament. TREATMENT   Sit down and relax when the pain starts.   Flex your knees up to your belly.   Lay on your side with a pillow under your belly (abdomen) and another one between your legs.   Sit in a hot bath for 15 to 20 minutes or until the pain goes away.  HOME CARE INSTRUCTIONS   Only take over-the-counter or prescriptions medicines for pain, discomfort or fever as directed by your  caregiver.   Sit and stand slowly.   Avoid long walks if it causes pain.   Stop or lessen your physical activities if it causes pain.  SEEK MEDICAL CARE IF:   The pain does not go away with any of your treatment.   You need stronger medication for the pain.   You develop back pain that you did not have before with the side pain.  SEEK IMMEDIATE MEDICAL CARE IF:   You develop a temperature of 102 F (38.9 C) or higher.   You develop uterine contractions.   You develop vaginal bleeding.   You develop nausea, vomiting or diarrhea.   You develop chills.   You have pain when you urinate.  Document Released: 12/13/2007 Document Revised: 02/22/2011 Document Reviewed: 12/13/2007 [facility] Patient Information 2012 [place], [place].

## 2011-05-24 NOTE — Progress Notes (Signed)
S.: Patient reports lower abdominal pain:  States that pain is 5/10. Constant, all the way around on both sides of lower abdomen. Seem to be worse with standing and walking. Nothing seems to make it go away. Has been told in the past that is round ligament pain but is concerned it is something more serious. No other abdominal pain. No vaginal bleeding. No vaginal discharge.  " weird" fetal movement: Patient states that she is familiar with her baby's movement. During the past 2 weeks has noticed the baby " shaking" as though "the baby is having a seizure" inside of her. This happens multiple times per day. Patient has family history on her side of Celesta Gentile genetic disorder. On the father side there is history of stroke causing. Mother is concerned that something is wrong with the baby. She states that she has 2 other children she knows what the baby should feel like when at moves inside. She feels like something is very wrong. Would like further studies on her baby to make sure everything is okay.  O: as per above.   Lungs- CTA bilateral CV- RRR, no m/r/g abd- gravid, mild tenderness along area of round ligament.   Ext- no edema  A and P: "weird movement" of baby: Previous US from 1 month ago reviewed.  WNL.  Per record has had healthy pregnancy.  Will perform BPP and NST today for reassurance.  Can discuss with PCP if referral to MFM appropriate/needed.   Round ligament pain: Pain consistent with round ligament pain.  Gave handout.  Tylenol prn.  Reassured pt.  Will recheck at next appt. Pt to return for new or worsening of symptoms.   Return for regular scheduled f/up appt with Dr. Denyse Amass

## 2011-05-24 NOTE — Telephone Encounter (Signed)
Called patient and informed her of appointment at [facility] for NST and BPP tomorrow at 8:00 am.Kimble Delaurentis, Rodena Medin

## 2011-05-24 NOTE — Telephone Encounter (Signed)
Please call patient back to inform of ultrasound appt with [facility] asap.  Patient stated that she need this to be tx'd as an emergency because of movement of baby.  Based on her family medical history, she and Dr. Denyse Amass agree she need to have this procedure.

## 2011-05-25 ENCOUNTER — Other Ambulatory Visit: Payer: Self-pay

## 2011-05-25 ENCOUNTER — Other Ambulatory Visit: Payer: Self-pay | Admitting: *Deleted

## 2011-05-25 ENCOUNTER — Ambulatory Visit (HOSPITAL_COMMUNITY)
Admission: RE | Admit: 2011-05-25 | Discharge: 2011-05-25 | Disposition: A | Discharge status: Home / Self Care | Payer: Self-pay | Source: Ambulatory Visit | Attending: Obstetrics & Gynecology | Admitting: Obstetrics & Gynecology

## 2011-05-25 DIAGNOSIS — Z3480 Encounter for supervision of other normal pregnancy, unspecified trimester: Secondary | ICD-10-CM

## 2011-05-25 DIAGNOSIS — O368190 Decreased fetal movements, unspecified trimester, not applicable or unspecified: Secondary | ICD-10-CM

## 2011-05-25 DIAGNOSIS — Z3689 Encounter for other specified antenatal screening: Secondary | ICD-10-CM | POA: Insufficient documentation

## 2011-06-08 ENCOUNTER — Encounter: Payer: Self-pay | Admitting: Family Medicine

## 2011-06-08 ENCOUNTER — Other Ambulatory Visit: Payer: Self-pay

## 2011-06-08 ENCOUNTER — Ambulatory Visit (INDEPENDENT_AMBULATORY_CARE_PROVIDER_SITE_OTHER): Payer: Self-pay | Admitting: Family Medicine

## 2011-06-08 VITALS — BP 111/72 | Wt 134.0 lb

## 2011-06-08 DIAGNOSIS — Z3490 Encounter for supervision of normal pregnancy, unspecified, unspecified trimester: Secondary | ICD-10-CM

## 2011-06-08 DIAGNOSIS — Z3480 Encounter for supervision of other normal pregnancy, unspecified trimester: Secondary | ICD-10-CM

## 2011-06-08 LAB — GLUCOSE, CAPILLARY
Comment 1: 1
Glucose-Capillary: 69 mg/dL — ABNORMAL LOW (ref 70–99)

## 2011-06-08 NOTE — Progress Notes (Signed)
S.: Patient is feeling well today with no complaints. No abdominal pain. No vaginal bleeding. No vaginal discharge. In the interim she developed what she describes as her baby have any seizure and get an ultrasound which was normal.   Additionally she quit smoking 17 days ago.  She feels well  O:  BP 111/72  Wt 134 lb (60.782 kg)  LMP 11/18/2010  Breastfeeding? Unknown as per above.   Lungs- CTA bilateral CV- RRR, no m/r/g abd- gravid, at 27 cm  Ext- no edema  A and P: 1) routine prenatal care.  As I will likely not be delivering a baby I advised her to switch her prenatal care to a different physician who will be available in June.  I advised Dr. Ashley Royalty.  If she is unable to schedule Dr. Ashley Royalty I be happy to see her back in my clinic.  One-hour glucose tolerance test, RPR, HIV, CBC today. 4 week followup.  2) weight gain: Appropriate for pregnancy.  3) smoking: Stop smoking congratulated the patient. I am very pleased about this!  4) contraception: Patient will resume depo shots after delivering. Discussed other options.

## 2011-06-08 NOTE — Patient Instructions (Addendum)
Thank you for coming in today. Please follow up with Dr. Ashley Royalty in 4 weeks.  Keep taking your prenatal vitamins.  Congratulations on quitting smoking.  Let us know if you feel bad or something happens.

## 2011-06-09 LAB — CBC WITH DIFFERENTIAL/PLATELET
Basophils Absolute: 0 10*3/uL (ref 0.0–0.1)
Basophils Relative: 0 % (ref 0–1)
Eosinophils Absolute: 0.1 10*3/uL (ref 0.0–0.7)
Hemoglobin: 9.9 g/dL — ABNORMAL LOW (ref 12.0–15.0)
MCH: 27.9 pg (ref 26.0–34.0)
MCHC: 31 g/dL (ref 30.0–36.0)
Monocytes Absolute: 0.7 10*3/uL (ref 0.1–1.0)
Monocytes Relative: 5 % (ref 3–12)
Neutro Abs: 9.7 10*3/uL — ABNORMAL HIGH (ref 1.7–7.7)
Neutrophils Relative %: 80 % — ABNORMAL HIGH (ref 43–77)
RDW: 14.7 % (ref 11.5–15.5)

## 2011-06-09 LAB — HIV ANTIBODY (ROUTINE TESTING W REFLEX): HIV: NONREACTIVE

## 2011-06-09 LAB — RPR

## 2011-07-05 ENCOUNTER — Ambulatory Visit (INDEPENDENT_AMBULATORY_CARE_PROVIDER_SITE_OTHER): Payer: Medicaid Other | Admitting: Family Medicine

## 2011-07-05 VITALS — BP 107/61 | Wt 130.9 lb

## 2011-07-05 DIAGNOSIS — Z3490 Encounter for supervision of normal pregnancy, unspecified, unspecified trimester: Secondary | ICD-10-CM

## 2011-07-05 DIAGNOSIS — Z3480 Encounter for supervision of other normal pregnancy, unspecified trimester: Secondary | ICD-10-CM

## 2011-07-08 NOTE — Progress Notes (Signed)
S: Here for OB f/u, currently [redacted]w[redacted]d by first trimester [place]. Feels well, no complaints.  Denies abdominal pain, vaginal bleeding/discharge.  Feeling baby move well Continued to abstain from smoking  O: Per flowsheet  A/P 1. Routine PNC:  Doing well, Glucola: 69.  Other labs WNL.    2. Weight gain:  Has lost 3 lbs since last visit, eating well.  Denies nausea.  Continue to monitor closely  3. Contraception: depo post partum  F/u in 2 weeks

## 2011-07-09 ENCOUNTER — Inpatient Hospital Stay (HOSPITAL_COMMUNITY)
Admission: AD | Admit: 2011-07-09 | Discharge: 2011-07-09 | Disposition: A | Discharge status: Home / Self Care | Payer: Medicaid Other | Source: Ambulatory Visit | Attending: Obstetrics and Gynecology | Admitting: Obstetrics and Gynecology

## 2011-07-09 ENCOUNTER — Telehealth: Payer: Self-pay | Admitting: *Deleted

## 2011-07-09 ENCOUNTER — Encounter (HOSPITAL_COMMUNITY): Payer: Self-pay | Admitting: *Deleted

## 2011-07-09 DIAGNOSIS — O2390 Unspecified genitourinary tract infection in pregnancy, unspecified trimester: Secondary | ICD-10-CM | POA: Insufficient documentation

## 2011-07-09 DIAGNOSIS — N390 Urinary tract infection, site not specified: Secondary | ICD-10-CM | POA: Insufficient documentation

## 2011-07-09 MED ORDER — FLUCONAZOLE 150 MG PO TABS: 150.0000 mg | ORAL_TABLET | Freq: Once | ORAL | Status: AC

## 2011-07-09 NOTE — MAU Provider Note (Signed)
  History     CSN: [id number]  Arrival date and time: 07/09/11 1446   First Provider Initiated Contact with Patient 07/09/11 1553      Chief Complaint  Patient presents with  . possible leaking    HPI [redacted]w[redacted]d  OB History    Grav Para Term Preterm Abortions TAB SAB Ect Mult Living   5 2 2  0 2 2 0 0 0 2     Complaining of 3 days of leaking. Reports her underwear becoming wet from time to time over the past 3 days. Awoke this morning to a small wet/damp spot on her bed. Denies any vaginal bleeding or odor. No active contractions. Fetal movement multiple times per hour.   Past Medical History  Diagnosis Date  . No pertinent past medical history     Past Surgical History  Procedure Date  . Dilation and curettage of uterus   . Arm wound repair / closure   . Termination   . Wisdom tooth extraction     Family History  Problem Relation Age of Onset  . Diabetes Mother   . Hypertension Mother   . Diabetes Father   . Hypertension Father     History  Substance Use Topics  . Smoking status: Former Smoker -- 1.0 packs/day    Types: Cigarettes  . Smokeless tobacco: Not on file  . Alcohol Use: No    Allergies: No Known Allergies  Prescriptions prior to admission  Medication Sig Dispense Refill  . Prenatal Vit-Fe Fumarate-FA (PRENATAL MULTIVITAMIN) 60-1 MG tablet Take 1 tablet by mouth daily.  30 tablet  6    Review of Systems  Constitutional: Negative for fever and chills.  Eyes: Negative for blurred vision and double vision.  Respiratory: Negative for shortness of breath.   Cardiovascular: Negative for chest pain.  Gastrointestinal: Negative for heartburn, nausea, vomiting, abdominal pain, diarrhea, constipation and blood in stool.  Genitourinary: Negative for dysuria, urgency and frequency.       Denies vagnial irritation  Skin: Negative for rash.  Neurological: Negative for headaches.   Physical Exam   Blood pressure 112/67, pulse 100, temperature 97.3 F (36.3  C), temperature source Oral, resp. rate 20, height 5\' 5"  (1.651 m), weight 60.782 kg (134 lb), last menstrual period 11/18/2010, unknown if currently breastfeeding.  Physical Exam  Constitutional: She is oriented to person, place, and time. She appears well-developed and well-nourished.  Eyes: EOM are normal.  Neck: Normal range of motion.  Cardiovascular: Normal rate and regular rhythm.   Respiratory: Effort normal.  GI: Soft. There is no tenderness. There is no rebound and no guarding.  Genitourinary: Uterus normal.       No pooling on speculum exam. Slight whitish discharge.   Musculoskeletal: Normal range of motion.  Neurological: She is alert and oriented to person, place, and time.  Skin: Skin is warm and dry.    Dilation: Closed Effacement (%): Thick Exam by:: Dr. Grayland Ormond Prep positive for yeast   MAU Course  Procedures   Assessment and Plan  26yo [redacted]w[redacted]d [id number] w/ yeast infection. Will treat w/ Diflucan 150mg  x1.  Reasons for return discussed w/ pt. OK for discharge.    MERRELL, DAVID 07/09/2011, 4:06 PM   Patient seen and examined.  Agree with above note.  Suzanne Ellis 07/09/2011 5:20 PM

## 2011-07-09 NOTE — Telephone Encounter (Signed)
Patient states for two days she has had some leakage of fluid. States it comes out in gushes. She is not having contractions , then states she is having [employer] contractions.   Fluid doesn't flow constantly just off and on and she stays wet. . Advised to go to [facility] now for evaluation.

## 2011-07-09 NOTE — MAU Note (Signed)
Last couple of wks has had braxton hicks.  Past 3 or 4 days, has been having little gushes of water, can't control it, doesn't smell like urine.

## 2011-07-18 ENCOUNTER — Encounter: Payer: Medicaid Other | Admitting: Family Medicine

## 2011-07-26 ENCOUNTER — Encounter: Payer: Medicaid Other | Admitting: Family Medicine

## 2011-08-07 ENCOUNTER — Encounter (HOSPITAL_COMMUNITY): Payer: Self-pay

## 2011-08-07 ENCOUNTER — Telehealth: Payer: Self-pay | Admitting: Family Medicine

## 2011-08-07 ENCOUNTER — Inpatient Hospital Stay (HOSPITAL_COMMUNITY)
Admission: AD | Admit: 2011-08-07 | Discharge: 2011-08-07 | Disposition: A | Discharge status: Home / Self Care | Payer: Medicaid Other | Source: Ambulatory Visit | Attending: Family Medicine | Admitting: Family Medicine

## 2011-08-07 DIAGNOSIS — O26893 Other specified pregnancy related conditions, third trimester: Secondary | ICD-10-CM

## 2011-08-07 DIAGNOSIS — M545 Low back pain, unspecified: Secondary | ICD-10-CM | POA: Insufficient documentation

## 2011-08-07 DIAGNOSIS — M79605 Pain in left leg: Secondary | ICD-10-CM

## 2011-08-07 DIAGNOSIS — O99891 Other specified diseases and conditions complicating pregnancy: Secondary | ICD-10-CM | POA: Insufficient documentation

## 2011-08-07 LAB — URINALYSIS, ROUTINE W REFLEX MICROSCOPIC
Glucose, UA: NEGATIVE mg/dL
Hgb urine dipstick: NEGATIVE
Specific Gravity, Urine: 1.01 (ref 1.005–1.030)

## 2011-08-07 LAB — URINE MICROSCOPIC-ADD ON

## 2011-08-07 MED ORDER — CYCLOBENZAPRINE HCL 10 MG PO TABS: 10.0000 mg | ORAL_TABLET | Freq: Three times a day (TID) | ORAL | Status: DC | PRN

## 2011-08-07 MED ORDER — OXYCODONE-ACETAMINOPHEN 5-325 MG PO TABS: 1.0000 | ORAL_TABLET | ORAL | Status: DC | PRN

## 2011-08-07 NOTE — MAU Note (Signed)
Pt states has had numbness in upper thighs x3 days, also notes lower back pain bilaterally. Denies uti s/s. Has noted vaginal discharge increase in past 7 weeks, no abnormal color or smell per pt. States "it doesn't bother me". Denies lof, or bleeding.

## 2011-08-07 NOTE — Discharge Instructions (Signed)

## 2011-08-07 NOTE — MAU Provider Note (Signed)
Chart reviewed and agree with management and plan.  

## 2011-08-07 NOTE — MAU Provider Note (Signed)
  History     CSN: [id number]  Arrival date and time: 08/07/11 1147   First Provider Initiated Contact with Patient 08/07/11 1244      Chief Complaint  Patient presents with  . Back Pain   HPI This is a 26 y.o. female at [redacted]w[redacted]d who presents with c/o 3 days of low back pain and superficial numbness in her upper thighs. Pain is sharp but does not shoot down legs. Hurts more when she walks. States has longterm abdominal pain but does not have chronic back pain. + FM ("moves a lot!").  OB History    Grav Para Term Preterm Abortions TAB SAB Ect Mult Living   5 2 2  0 2 2 0 0 0 2      Past Medical History  Diagnosis Date  . No pertinent past medical history     Past Surgical History  Procedure Date  . Dilation and curettage of uterus   . Arm wound repair / closure   . Termination   . Wisdom tooth extraction     Family History  Problem Relation Age of Onset  . Diabetes Mother   . Hypertension Mother   . Diabetes Father   . Hypertension Father   . Anesthesia problems Neg Hx   . Hypotension Neg Hx   . Malignant hyperthermia Neg Hx   . Pseudochol deficiency Neg Hx     History  Substance Use Topics  . Smoking status: Current Everyday Smoker -- 0.5 packs/day    Types: Cigarettes  . Smokeless tobacco: Never Used  . Alcohol Use: No    Allergies: No Known Allergies  Prescriptions prior to admission  Medication Sig Dispense Refill  . Prenatal Vit-Fe Fumarate-FA (PRENATAL MULTIVITAMIN) 60-1 MG tablet Take 1 tablet by mouth daily.  30 tablet  6    Review of Systems  Constitutional: Negative for fever and chills.  Gastrointestinal: Positive for abdominal pain. Negative for nausea and vomiting.  Genitourinary: Negative for dysuria.  Musculoskeletal: Positive for back pain and joint pain (Hips hurt also).   As listed in HPI.   Physical Exam   Blood pressure 112/72, pulse 116, temperature 98.6 F (37 C), temperature source Oral, resp. rate 16, height 5\' 5"  (1.651 m),  weight 135 lb 9.6 oz (61.508 kg), last menstrual period 11/18/2010, SpO2 99.00%, unknown if currently breastfeeding.  Physical Exam  Constitutional: She is oriented to person, place, and time. She appears well-developed and well-nourished.  HENT:  Head: Normocephalic.  Cardiovascular: Normal rate.   Respiratory: Effort normal.  GI: Soft. She exhibits no distension and no mass. There is tenderness. There is no rebound and no guarding.  Musculoskeletal: Normal range of motion. She exhibits tenderness (over low back and hips). She exhibits no edema.  Neurological: She is alert and oriented to person, place, and time.  Skin: Skin is warm and dry.  Psychiatric: She has a normal mood and affect.  Dilation: 1 Effacement (%): 50 Station: -3 Exam by:: Juliann Olesky   MAU Course  Procedures  MDM See below  Assessment and Plan  A:  [facility]UP at [redacted]w[redacted]d      Low back pain P:  Discharge home       Rx Flexeril for primary use (pt advised to use first to see if spasm is relieved)      Rx limited Percocet (advised to use judiciously, only after trying Flexeril)       Reevaluate Thursday in clinic  [facility] 08/07/2011, 12:56 PM

## 2011-08-07 NOTE — Telephone Encounter (Signed)
Emergency Line Call:  Suzanne Ellis to ED earlier today because of severe back pain, contractions.  Ctx are intermittent, irregular. Went to MAU earlier today and was Rx'ed percocet and flexeril for her back pain but she cannot afford to fill them.  She went to pharmacy and they said that her Medicaid has been inactivated but there is no one she can talk to about it to get it fixed this evening.  Pt sounds uncomfortable.  Advised pt to return to MAU for labor symptoms (regular ctx every 3-5 min, water breaking, etc).  Suggested Tylenol and heating pad or hot shower/bath overnight tonight and to call Medicaid first thing in the morning to get insurance straightened out.  Advised pt to return to MAU if pain is worse or she cannot take it any more.  Could possibly get Rx for ambien if needed tonight from MAU if she returns, which she may be able to afford or get a dose there if she does not drive herself (which I also advised).

## 2011-08-07 NOTE — MAU Note (Signed)
Patient states she has had constant low back pain and numbness in both thighs for 3 days. States she has had a discharge for about 7 weeks. Denies any bleeding and reports good fetal movement.

## 2011-08-08 ENCOUNTER — Telehealth: Payer: Self-pay | Admitting: Family Medicine

## 2011-08-08 ENCOUNTER — Ambulatory Visit (INDEPENDENT_AMBULATORY_CARE_PROVIDER_SITE_OTHER): Payer: Medicaid Other | Admitting: Family Medicine

## 2011-08-08 VITALS — BP 107/61 | Wt 134.0 lb

## 2011-08-08 DIAGNOSIS — Z3480 Encounter for supervision of other normal pregnancy, unspecified trimester: Secondary | ICD-10-CM

## 2011-08-08 DIAGNOSIS — M549 Dorsalgia, unspecified: Secondary | ICD-10-CM | POA: Insufficient documentation

## 2011-08-08 DIAGNOSIS — N898 Other specified noninflammatory disorders of vagina: Secondary | ICD-10-CM

## 2011-08-08 LAB — POCT WET PREP (WET MOUNT)

## 2011-08-08 MED ORDER — HYDROCODONE-ACETAMINOPHEN 5-500 MG PO TABS: 1.0000 | ORAL_TABLET | Freq: Three times a day (TID) | ORAL | Status: DC | PRN

## 2011-08-08 NOTE — Assessment & Plan Note (Signed)
Patient has acute low back pain with occasional episodes of possible fecal incontinence.  This is occurring while the patient is at the end of her pregnancy.  She has been evaluated at the MAU and found to not be in labor.   Her cervix exam has changed some over the past day therefore early labor may be possible.  On the differential is also muscular back pain.   She has normal rectal tone sensation and strength therefore I feel that serious disc etiology such as cauda equina syndrome are very unlikely.   She does appear to be in significant pain. Moderate risk situation as the patient is pregnant  Plan: Treat symptoms with Vicodin and followup in one day.  Discussed precautions such as worsening weakness numbness bowel or bladder incontinence and pregnancy precautions such as vaginal bleeding or loss of fetal movements.  Patient is upset but expresses understanding.   I explained that we cannot induce at 37 weeks without fetal distress.

## 2011-08-08 NOTE — Telephone Encounter (Signed)
Patient is 38.[redacted] weeks gestation and complains of low back and abdominal pain.  She was seen by physician and given Rx for Percocet, but she does not want to take narcotics because "they make her feel bad."  She states that she is 4 cm dilated and she cannot tolerate the pain.  Denies any bloody show, gush of fluid, or decreased fetal movement.  Denies regular contractions, only braxton hicks right now.  I advised patient to take 1/2 tablet and try to sleep on her side or in a recliner to relieve back pain.  If pain worsens, advised patient to go to MAU.  Labor precautions reviewed.  She understood/agreed with plan.

## 2011-08-08 NOTE — Patient Instructions (Signed)
Thank you for coming in today. Please take the vicodin as needed for pain.  Come back tomorrow.  Go to the hospital if you notice no fetal moveement or bleeding or you think you are in labor.  You may be in very early labor.  Come back or go to the emergency room if you notice new weakness new numbness problems walking or bowel or bladder problems.

## 2011-08-08 NOTE — Progress Notes (Signed)
Suzanne Ellis is a 26 y.o. female who presents to [facility] today for severe back pain.  Patient began having significant back pain yesterday. This is associated with irregular contractions and weakness in her legs.  She presented to the [facility] U. where she was determined to not be in labor.  She was prescribed Flexeril and Percocet.  In the interim she has continued to have back pain and has not taken the Flexeril or Percocet at a fear of harming her baby.  She has fetal movement and denies any vaginal bleeding.  She has vaginal discharge which has been stable for the last 2 weeks.  She denies any fever or chills. She does note that she has had 2 episodes where she lost control of her stool momentarily.  She denies any numbness in her extremities.  She notes tingling in her thighs.  She rates the pain as a 10 out of 10.  She is desperate to have this baby and requests an induction.   PMH: Reviewed current pregnancy at 37.4 weeks History  Substance Use Topics  . Smoking status: Current Everyday Smoker -- 0.5 packs/day    Types: Cigarettes  . Smokeless tobacco: Never Used  . Alcohol Use: No   ROS as above  Medications reviewed. Current Outpatient Prescriptions  Medication Sig Dispense Refill  . cyclobenzaprine (FLEXERIL) 10 MG tablet Take 1 tablet (10 mg total) by mouth 3 (three) times daily as needed for muscle spasms.  30 tablet  0  . HYDROcodone-acetaminophen (VICODIN) 5-500 MG per tablet Take 1 tablet by mouth every 8 (eight) hours as needed for pain.  20 tablet  0  . oxyCODONE-acetaminophen (ROXICET) 5-325 MG per tablet Take 1 tablet by mouth every 4 (four) hours as needed for pain.  15 tablet  0  . Prenatal Vit-Fe Fumarate-FA (PRENATAL MULTIVITAMIN) 60-1 MG tablet Take 1 tablet by mouth daily.  30 tablet  6    Exam:  BP 107/61  Wt 134 lb (60.782 kg)  LMP 11/18/2010 Gen: Well NAD, in pain appearing HEENT: EOMI,  MMM Lungs: CTABL Nl WOB Heart: RRR no MRG Abd: NABS, gravid uterus Back:  Nontender over midline Genitals: White vaginal discharge present. Cervix is posterior and high  approximately 2 cm dilated and 50% effaced Rectal: Rectal tone is normal and patient can squeeze and relax Neuro: Sensation in lower extremities and saddle area are normal.  Reflexes are intact and equal.  Strength is intact.  Patient is able to stand and walk and stand on toes and heels.  She moves slowly with pain  Results for orders placed in visit on 08/08/11 (from the past 72 hour(s))  POCT WET PREP (WET MOUNT)     Status: Normal   Collection Time   08/08/11  3:03 PM      Component Value Range Comment   Source Wet Prep POC VAG      WBC, Wet Prep HPF POC 1-5      Bacteria Wet Prep HPF POC 3+ RODS      Clue Cells Wet Prep HPF POC None      CLUE CELLS WET PREP WHIFF POC Negative Whiff      Yeast Wet Prep HPF POC None      Trichomonas Wet Prep HPF POC none       Nitrazine swab was negative

## 2011-08-09 ENCOUNTER — Inpatient Hospital Stay (HOSPITAL_COMMUNITY)
Admission: AD | Admit: 2011-08-09 | Discharge: 2011-08-09 | Disposition: A | Discharge status: Home / Self Care | Payer: Medicaid Other | Source: Ambulatory Visit | Attending: Obstetrics & Gynecology | Admitting: Obstetrics & Gynecology

## 2011-08-09 ENCOUNTER — Other Ambulatory Visit: Payer: Self-pay | Admitting: Emergency Medicine

## 2011-08-09 ENCOUNTER — Encounter (HOSPITAL_COMMUNITY): Payer: Self-pay | Admitting: *Deleted

## 2011-08-09 ENCOUNTER — Ambulatory Visit: Payer: Medicaid Other | Admitting: Emergency Medicine

## 2011-08-09 DIAGNOSIS — O471 False labor at or after 37 completed weeks of gestation: Secondary | ICD-10-CM

## 2011-08-09 DIAGNOSIS — R110 Nausea: Secondary | ICD-10-CM

## 2011-08-09 DIAGNOSIS — O9989 Other specified diseases and conditions complicating pregnancy, childbirth and the puerperium: Secondary | ICD-10-CM

## 2011-08-09 DIAGNOSIS — O479 False labor, unspecified: Secondary | ICD-10-CM | POA: Insufficient documentation

## 2011-08-09 DIAGNOSIS — M549 Dorsalgia, unspecified: Secondary | ICD-10-CM | POA: Insufficient documentation

## 2011-08-09 MED ORDER — ONDANSETRON 8 MG PO TBDP: 8.0000 mg | ORAL_TABLET | Freq: Three times a day (TID) | ORAL | Status: DC | PRN

## 2011-08-09 MED ORDER — ONDANSETRON HCL 4 MG/2ML IJ SOLN
4.0000 mg | Freq: Once | INTRAMUSCULAR | Status: AC
  Administered 2011-08-09: 4 mg via INTRAVENOUS
  Filled 2011-08-09: qty 2

## 2011-08-09 MED ORDER — HYDROXYZINE HCL 25 MG PO TABS
25.0000 mg | ORAL_TABLET | Freq: Once | ORAL | Status: DC | PRN
  Filled 2011-08-09: qty 1

## 2011-08-09 MED ORDER — NALBUPHINE SYRINGE 5 MG/0.5 ML
5.0000 mg | INJECTION | Freq: Once | INTRAMUSCULAR | Status: AC
  Administered 2011-08-09: 5 mg via INTRAVENOUS
  Filled 2011-08-09: qty 0.5

## 2011-08-09 MED ORDER — NALBUPHINE SYRINGE 5 MG/0.5 ML
5.0000 mg | INJECTION | Freq: Once | INTRAMUSCULAR | Status: DC | PRN
  Filled 2011-08-09: qty 0.5

## 2011-08-09 MED ORDER — ZOLPIDEM TARTRATE 5 MG PO TABS: 5.0000 mg | ORAL_TABLET | Freq: Every evening | ORAL | Status: DC | PRN

## 2011-08-09 NOTE — MAU Provider Note (Signed)
Chief Complaint:  Back Pain   First Provider Initiated Contact with Patient 08/09/11 1132      HPI  Suzanne Ellis is a 26 y.o. [id number] at [redacted]w[redacted]d presenting with worsening bilat low back pain radiating around abdomen over the past two days. She has been seen in [facility]U and [facility] for same and was Rx'd Vicodin and Flexeril, but was unable to take either one due to severe N/V. Pain mostly coincides w/ UC's Q~15 minutes. Minor fall this morning >4 hours ago when getting into tub. Hit left hip. No abd trauma. Pt requesting that we put her into labor. States she hasn't been able to sleep for days. Denies Urinary complaints, fever, chills, leakage of fluid or vaginal bleeding. Good fetal movement.   Past Medical History: Past Medical History  Diagnosis Date  . No pertinent past medical history     Past Surgical History: Past Surgical History  Procedure Date  . Dilation and curettage of uterus   . Arm wound repair / closure   . Termination   . Wisdom tooth extraction     Family History: Family History  Problem Relation Age of Onset  . Diabetes Mother   . Hypertension Mother   . Diabetes Father   . Hypertension Father   . Anesthesia problems Neg Hx   . Hypotension Neg Hx   . Malignant hyperthermia Neg Hx   . Pseudochol deficiency Neg Hx     Social History: History  Substance Use Topics  . Smoking status: Current Everyday Smoker -- 0.5 packs/day    Types: Cigarettes  . Smokeless tobacco: Never Used  . Alcohol Use: No    Allergies: No Known Allergies  Meds:  Prescriptions prior to admission  Medication Sig Dispense Refill  . cyclobenzaprine (FLEXERIL) 10 MG tablet Take 1 tablet (10 mg total) by mouth 3 (three) times daily as needed for muscle spasms.  30 tablet  0  . HYDROcodone-acetaminophen (VICODIN) 5-500 MG per tablet Take 1 tablet by mouth every 8 (eight) hours as needed for pain.  20 tablet  0  . oxyCODONE-acetaminophen (ROXICET) 5-325 MG per tablet Take 1 tablet by mouth  every 4 (four) hours as needed for pain.  15 tablet  0  . Prenatal Vit-Fe Fumarate-FA (PRENATAL MULTIVITAMIN) 60-1 MG tablet Take 1 tablet by mouth daily.  30 tablet  6      Physical Exam  Blood pressure 96/47, pulse 77, resp. rate 20, height 5\' 5"  (1.651 m), weight 60.782 kg (134 lb), last menstrual period 11/18/2010. GENERAL: Well-developed, well-nourished female in moderate distress. Tearful.  HEENT: normocephalic, good dentition HEART: normal rate RESP: normal effort ABDOMEN: Soft, nontender, nondistended, gravid. No bruising or injury on abd or left hip.  BACK: No CVAT. No low back tenderness.  EXTREMITIES: Nontender, no edema NEURO: alert and oriented  SPECULUM EXAM: NA Dilation: 3 Effacement (%): 50 Station: -3 Presentation: Vertex Exam by:: V.Jcion Buddenhagen,CNM  FHT:  Baseline 135 , moderate variability, accelerations present, no decelerations Contractions: q 3-5 mins, moderate  Some relief from IV fluids, nubain and Zofran. Coping better.  No cervical change after 4 hours.   Labs:  Imaging:    Assessment: 1. Back pain complicating pregnancy, third trimester   2. False labor after 37 weeks of gestation without delivery   3. Nausea    Plan: D/C home Discussed rational for not inducing/augmenting at 37.6.  Will take Zofran and then try other meds that she already has. Comfort measures Medication List  As of  08/10/2011  3:34 PM   START taking these medications         ondansetron 8 MG disintegrating tablet   Commonly known as: ZOFRAN-ODT   Take 1 tablet (8 mg total) by mouth every 8 (eight) hours as needed for nausea.      zolpidem 5 MG tablet   Commonly known as: AMBIEN   Take 1 tablet (5 mg total) by mouth at bedtime as needed for sleep.         CONTINUE taking these medications         cyclobenzaprine 10 MG tablet   Commonly known as: FLEXERIL   Take 1 tablet (10 mg total) by mouth 3 (three) times daily as needed for muscle spasms.       HYDROcodone-acetaminophen 5-500 MG per tablet   Commonly known as: VICODIN   Take 1 tablet by mouth every 8 (eight) hours as needed for pain.      oxyCODONE-acetaminophen 5-325 MG per tablet   Commonly known as: PERCOCET   Take 1 tablet by mouth every 4 (four) hours as needed for pain.      prenatal multivitamin 60-1 MG tablet   Take 1 tablet by mouth daily.          Where to get your medications    These are the prescriptions that you need to pick up.   You may get these medications from any pharmacy.         ondansetron 8 MG disintegrating tablet   zolpidem 5 MG tablet           Follow-up Information    [facility] (as scheduled or [facility]U as needed if symptoms worsen)    Contact information:   [address] [place] [address]        Labor precautions, FKCs   [place], [place] 5/23/201312:56 PM

## 2011-08-09 NOTE — Patient Instructions (Signed)
We are going to send you over to [facility].  I do not think you are in active labor yet.  However, given your pain and difficulty with walking, we would like you to be evaluated.

## 2011-08-09 NOTE — Progress Notes (Signed)
S: 26 yo [id number] at [redacted]w[redacted]d by LMP and early [place].  Coming today for follow up of back pain.  Seen in MAU two days ago, given flexeril and percocet, unable to take them as they made her dizzy and sick.  Seen yesterday by Dr. Denyse Amass and given vicodin - unable to take as makes her sick.  Low back and pelvic pain is constant.  Does have contractions every 15 minutes that she rates as strong.  Good fetal movement.  Describes constant "leaking" but no gush of fluid.  Did have some brownish discharge yesterday.  She is in significant pain to the point where she states she is unable to walk.  Did drive herself to the appointment today.  Continues to have trouble with fecal incontinence and numbness/tingling is now in her entire legs.  O: see flowsheet Gen: lying on side on exam table, crying, limited cooperativeness with exam secondary to pain.  Back: diffusely tender over low back Ext: no edema, moving legs spontaneously, able to walk with extreme pain Rectal: did not repeat rectal exam today Neuro: unable to assess strength and sensation secondary to pain Pelvic: mild discharge, unable to check cervix due to posterior location, -3 station  A/P: 26 yo [id number] at [redacted]w[redacted]d - will send to MAU for evaluation and possible admission for pain control - severe low back pain - unable to walk, unable to tolerate the oral pain medicines, if not in active labor would consider imaging the spine for possible ruptured disc vs spondylolysis given severity of pain and possible signs of compression  - does not appear to be in active labor at this time - did report that second labor was quite fast at 4 hours. - fetal status reassuring by movement and FHT

## 2011-08-10 ENCOUNTER — Telehealth: Payer: Self-pay | Admitting: Family Medicine

## 2011-08-10 NOTE — Telephone Encounter (Signed)
Having contractions 3-5 minutes apart for one hour. This is baby 3 for her. No loss of fluid.  Baby hasn't moved x 1 hour.  Advised to be seen at [facility] for eval.

## 2011-08-15 ENCOUNTER — Ambulatory Visit (INDEPENDENT_AMBULATORY_CARE_PROVIDER_SITE_OTHER): Payer: Medicaid Other | Admitting: Family Medicine

## 2011-08-15 ENCOUNTER — Encounter (HOSPITAL_COMMUNITY): Payer: Self-pay | Admitting: *Deleted

## 2011-08-15 ENCOUNTER — Inpatient Hospital Stay (HOSPITAL_COMMUNITY)
Admission: AD | Admit: 2011-08-15 | Discharge: 2011-08-15 | Disposition: A | Discharge status: Home / Self Care | Payer: Medicaid Other | Source: Ambulatory Visit | Attending: Obstetrics & Gynecology | Admitting: Obstetrics & Gynecology

## 2011-08-15 ENCOUNTER — Encounter: Payer: Self-pay | Admitting: Family Medicine

## 2011-08-15 VITALS — BP 112/64 | Temp 98.6°F | Wt 134.1 lb

## 2011-08-15 DIAGNOSIS — O368390 Maternal care for abnormalities of the fetal heart rate or rhythm, unspecified trimester, not applicable or unspecified: Secondary | ICD-10-CM | POA: Insufficient documentation

## 2011-08-15 DIAGNOSIS — Z3490 Encounter for supervision of normal pregnancy, unspecified, unspecified trimester: Secondary | ICD-10-CM

## 2011-08-15 DIAGNOSIS — Z3689 Encounter for other specified antenatal screening: Secondary | ICD-10-CM

## 2011-08-15 DIAGNOSIS — Z36 Encounter for antenatal screening of mother: Secondary | ICD-10-CM

## 2011-08-15 DIAGNOSIS — Z3480 Encounter for supervision of other normal pregnancy, unspecified trimester: Secondary | ICD-10-CM

## 2011-08-15 NOTE — MAU Note (Signed)
Patient states she was sent from [facility] due to the fetal heart beat being low in the 120's. States some irregular contractions. Denies any bleeding or leaking but does have a discharge. Reports fetal movement, not as much as usual.

## 2011-08-15 NOTE — Discharge Instructions (Signed)

## 2011-08-15 NOTE — MAU Provider Note (Signed)
Chief Complaint:  Non-stress Test   None     HPI  Suzanne Ellis is a 26 y.o. [id number] at [redacted]w[redacted]d presenting with low FHR in office at [facility] today. Denies regular contractions, leakage of fluid or vaginal bleeding. Good fetal movement.   Pregnancy Course: uncomplicated  Past Medical History: Past Medical History  Diagnosis Date  . No pertinent past medical history   . CHLAMYDTRACHOMATIS INFECTION LOWER GU SITES 07/28/2008    Qualifier: Diagnosis of  By: Jake Shark RN, Amy      Past Surgical History: Past Surgical History  Procedure Date  . Dilation and curettage of uterus   . Arm wound repair / closure   . Termination   . Wisdom tooth extraction     Family History: Family History  Problem Relation Age of Onset  . Diabetes Mother   . Hypertension Mother   . Diabetes Father   . Hypertension Father   . Anesthesia problems Neg Hx   . Hypotension Neg Hx   . Malignant hyperthermia Neg Hx   . Pseudochol deficiency Neg Hx     Social History: History  Substance Use Topics  . Smoking status: Current Everyday Smoker -- 0.5 packs/day    Types: Cigarettes  . Smokeless tobacco: Never Used  . Alcohol Use: No    Allergies: No Known Allergies  Meds:  Prescriptions prior to admission  Medication Sig Dispense Refill  . Prenatal Vit-Fe Fumarate-FA (PRENATAL MULTIVITAMIN) 60-1 MG tablet Take 1 tablet by mouth daily.  30 tablet  6      Physical Exam  Blood pressure 108/61, pulse 92, temperature 98.1 F (36.7 C), temperature source Oral, resp. rate 18, last menstrual period 11/18/2010, SpO2 100.00%. GENERAL: Well-developed, well-nourished female in no acute distress.  HEENT: normocephalic, good dentition HEART: normal rate RESP: normal effort ABDOMEN: Soft, nontender, nondistended, gravid.  EXTREMITIES: Nontender, no edema NEURO: alert and oriented   Dilation: 2 Effacement (%): 50 Cervical Position: Posterior Exam by:: L. KIrby  FHR baseline 120 with moderate  variability, positive accels, no decels--Category I FHR tracing Toco indicates some irritability with contractions 1-2 in 30 minutes   Assessment: Reactive NST   Plan: D/C home with labor precautions Reviewed fetal kick counts F/U with [facility]  Return to MAU as needed  LEFTWICH-KIRBY, Tarun Patchell 5/29/20131:24 PM

## 2011-08-15 NOTE — MAU Provider Note (Signed)
Medical Screening exam and patient care preformed by advanced practice provider.  Agree with the above management.  

## 2011-08-15 NOTE — Patient Instructions (Addendum)
Suzanne Ellis,  Thank you for coming in today. Please head over to MAU for monitoring. I have called over and they are expecting you.  If all goes well at MAU, please f/u in 1 week. Make an appt at check out.   Please watch out for signs of labor: vaginal bleeding, leakage or gush of fluids, 4 or more contractions for 2 or more hours. Please call the clinic if any of these occur and go to [facility] Please watch out for decreased fetal movement: If you feel that the baby is not moving as well as usual. You should count kicks. Go to a quite room and count each kick. Anything less than 10 kicks an hour for 2 or more hours is decreased/low. If this happens try drinking cool water. If that does not help. Call the clinic/[facility] to be [facility].   Dr. Armen Pickup

## 2011-08-15 NOTE — Progress Notes (Signed)
S: Patient reports decreased fetal movement x 1 day. Movement present but less than usual. She called nurse line last PM and was advised to go to MAU. She did not go because movement picked up a bit. Admits to persistent ow back pain since this pregnancy. Taking flexeril. Not taking percocet or Vicodin.  O: as per flowsheet A/P: 26 yo multigravida at [redacted]w[redacted]d based on T1 ultrasound.  -GBS swab obtained. Reviewed prenatal labs.  -Girl baby -Depo for contraception -to MAU for NST. Indication decreased fetal movement.  -reviewed kick counts and signs of labor.  -f/u in 1 week.

## 2011-08-16 ENCOUNTER — Telehealth: Payer: Self-pay | Admitting: Family Medicine

## 2011-08-16 NOTE — Telephone Encounter (Signed)
Called patient back. Reviewed MAU visit. Fetal tracing was reassuring. Patient is worried and would like an ultrasound. Offered reassurance. Advised patient to continue with kick counts and to watch out for signs of labor. Advised her to f/u next week. If she notices sustained decreased fetal movement from baseline advised her to go back to MAU. She agreed with plan and voiced understanding.

## 2011-08-16 NOTE — Telephone Encounter (Signed)
Pt went to [facility] yesterday and they sent her home.  She is very concerned about the low FHB and wants to talk to Dr Armen Pickup - asap Patient is very worried.

## 2011-08-19 DIAGNOSIS — O368190 Decreased fetal movements, unspecified trimester, not applicable or unspecified: Secondary | ICD-10-CM

## 2011-08-20 ENCOUNTER — Encounter (HOSPITAL_COMMUNITY): Payer: Self-pay | Admitting: *Deleted

## 2011-08-20 ENCOUNTER — Inpatient Hospital Stay (HOSPITAL_COMMUNITY)
Admission: AD | Admit: 2011-08-20 | Discharge: 2011-08-20 | Disposition: A | Discharge status: Home / Self Care | Payer: Medicaid Other | Source: Ambulatory Visit | Attending: Obstetrics and Gynecology | Admitting: Obstetrics and Gynecology

## 2011-08-20 DIAGNOSIS — O368190 Decreased fetal movements, unspecified trimester, not applicable or unspecified: Secondary | ICD-10-CM

## 2011-08-20 NOTE — MAU Provider Note (Signed)
Agree with above note.  Suzanne Ellis 08/20/2011 6:03 AM

## 2011-08-20 NOTE — MAU Note (Signed)
Pt reports decreased fetal movement for "days", has had NST's for decreased fetal movement in the last week but has had < 5 movements per hour. Denies pain.

## 2011-08-20 NOTE — MAU Note (Signed)
Pt reports good fetal movement at discharge.

## 2011-08-20 NOTE — MAU Provider Note (Addendum)
  History    CSN: [id number] Arrival date and time: 08/20/11 0058  None  Chief Complaint  Patient presents with  . Decreased Fetal Movement   HPI Comments: Pt presents for decreased fetal movement.  Reports going hiking earlier in day and did not notice baby moving when done.  Tonight had 1 hour of no perceived movement and presented to MAU for evaluation. Denies headache, vision changes, swelling, vaginal bleeding/discharge.  No Falls, no back pain, no leg pain, no headache   OB History    Grav Para Term Preterm Abortions TAB SAB Ect Mult Living   5 2 2  0 2 2 0 0 0 2      Past Medical History  Diagnosis Date  . No pertinent past medical history   . CHLAMYDTRACHOMATIS INFECTION LOWER GU SITES 07/28/2008    Qualifier: Diagnosis of  By: Jake Shark RN, Amy      Past Surgical History  Procedure Date  . Dilation and curettage of uterus   . Arm wound repair / closure   . Termination   . Wisdom tooth extraction     Family History  Problem Relation Age of Onset  . Diabetes Mother   . Hypertension Mother   . Diabetes Father   . Hypertension Father   . Anesthesia problems Neg Hx   . Hypotension Neg Hx   . Malignant hyperthermia Neg Hx   . Pseudochol deficiency Neg Hx     History  Substance Use Topics  . Smoking status: Current Everyday Smoker -- 0.5 packs/day    Types: Cigarettes  . Smokeless tobacco: Never Used  . Alcohol Use: No    Allergies: No Known Allergies  Prescriptions prior to admission  Medication Sig Dispense Refill  . Prenatal Vit-Fe Fumarate-FA (PRENATAL MULTIVITAMIN) 60-1 MG tablet Take 1 tablet by mouth daily.  30 tablet  6   Review of Systems  All other systems reviewed and are negative.  PER HPI Physical Exam   Blood pressure 108/65, pulse 89, temperature 98.9 F (37.2 C), temperature source Oral, resp. rate 18, height 5\' 5"  (1.651 m), weight 62.596 kg (138 lb), last menstrual period 11/18/2010, SpO2 100.00%.  Physical Exam  Constitutional: She  appears well-developed and well-nourished. No distress.  HENT:  Head: Normocephalic and atraumatic.  Eyes: Conjunctivae are normal. Right eye exhibits no discharge. Left eye exhibits no discharge. No scleral icterus.  Cardiovascular: Normal rate and regular rhythm.   Respiratory: Breath sounds normal. No respiratory distress.  GI: She exhibits distension.  Genitourinary:       NST is remarkable for moderate short tern and long term variability with frequent accelerations.  No decels.  Category 1 Tracing.  1 contraction noted that was not perceived by pt.  Musculoskeletal: Normal range of motion.  Skin: Skin is warm and dry. She is not diaphoretic.  Psychiatric: She has a normal mood and affect. Her behavior is normal. Judgment and thought content normal.    MAU Course  Procedures  MDM NST normal  Assessment and Plan  Normal 3rd Trimester Pregnancy, D/c to home with instructions to follow up for sustained decreased fetal movement & per AVS  Andrena Mews, DO [facility] Resident - PGY-1 08/20/2011 1:38 AM  Patient seen and examined.  Agree with above note.  Levie Heritage, DO 08/20/2011 7:01 AM

## 2011-08-20 NOTE — Discharge Instructions (Signed)
Fetal Movement Counts Patient Name: __________________________________________________ Patient Due Date: ____________________ Kick counts is highly recommended in high risk pregnancies, but it is a good idea for every pregnant woman to do. Start counting fetal movements at 28 weeks of the pregnancy. Fetal movements increase after eating a full meal or eating or drinking something sweet (the blood sugar is higher). It is also important to drink plenty of fluids (well hydrated) before doing the count. Lie on your left side because it helps with the circulation or you can sit in a comfortable chair with your arms over your belly (abdomen) with no distractions around you. DOING THE COUNT  Try to do the count the same time of day each time you do it.   Mark the day and time, then see how long it takes for you to feel 10 movements (kicks, flutters, swishes, rolls). You should have at least 10 movements within 2 hours. You will most likely feel 10 movements in much less than 2 hours. If you do not, wait an hour and count again. After a couple of days you will see a pattern.   What you are looking for is a change in the pattern or not enough counts in 2 hours. Is it taking longer in time to reach 10 movements?  SEEK MEDICAL CARE IF:  You feel less than 10 counts in 2 hours. Tried twice.   No movement in one hour.   The pattern is changing or taking longer each day to reach 10 counts in 2 hours.   You feel the baby is not moving as it usually does.  Date: ____________ Movements: ____________ Start time: ____________ Finish time: ____________  Date: ____________ Movements: ____________ Start time: ____________ Finish time: ____________ Date: ____________ Movements: ____________ Start time: ____________ Finish time: ____________ Date: ____________ Movements: ____________ Start time: ____________ Finish time: ____________ Date: ____________ Movements: ____________ Start time: ____________ Finish time:  ____________ Date: ____________ Movements: ____________ Start time: ____________ Finish time: ____________ Date: ____________ Movements: ____________ Start time: ____________ Finish time: ____________ Date: ____________ Movements: ____________ Start time: ____________ Finish time: ____________  Date: ____________ Movements: ____________ Start time: ____________ Finish time: ____________ Date: ____________ Movements: ____________ Start time: ____________ Finish time: ____________ Date: ____________ Movements: ____________ Start time: ____________ Finish time: ____________ Date: ____________ Movements: ____________ Start time: ____________ Finish time: ____________ Date: ____________ Movements: ____________ Start time: ____________ Finish time: ____________ Date: ____________ Movements: ____________ Start time: ____________ Finish time: ____________ Date: ____________ Movements: ____________ Start time: ____________ Finish time: ____________  Date: ____________ Movements: ____________ Start time: ____________ Finish time: ____________ Date: ____________ Movements: ____________ Start time: ____________ Finish time: ____________ Date: ____________ Movements: ____________ Start time: ____________ Finish time: ____________ Date: ____________ Movements: ____________ Start time: ____________ Finish time: ____________ Date: ____________ Movements: ____________ Start time: ____________ Finish time: ____________ Date: ____________ Movements: ____________ Start time: ____________ Finish time: ____________ Date: ____________ Movements: ____________ Start time: ____________ Finish time: ____________  Date: ____________ Movements: ____________ Start time: ____________ Finish time: ____________ Date: ____________ Movements: ____________ Start time: ____________ Finish time: ____________ Date: ____________ Movements: ____________ Start time: ____________ Finish time: ____________ Date: ____________ Movements:  ____________ Start time: ____________ Finish time: ____________ Date: ____________ Movements: ____________ Start time: ____________ Finish time: ____________ Date: ____________ Movements: ____________ Start time: ____________ Finish time: ____________ Date: ____________ Movements: ____________ Start time: ____________ Finish time: ____________  Date: ____________ Movements: ____________ Start time: ____________ Finish time: ____________ Date: ____________ Movements: ____________ Start time: ____________ Finish time: ____________ Date: ____________ Movements: ____________ Start time:   ____________ Finish time: ____________ Date: ____________ Movements: ____________ Start time: ____________ Finish time: ____________ Date: ____________ Movements: ____________ Start time: ____________ Finish time: ____________ Date: ____________ Movements: ____________ Start time: ____________ Finish time: ____________ Date: ____________ Movements: ____________ Start time: ____________ Finish time: ____________  Date: ____________ Movements: ____________ Start time: ____________ Finish time: ____________ Date: ____________ Movements: ____________ Start time: ____________ Finish time: ____________ Date: ____________ Movements: ____________ Start time: ____________ Finish time: ____________ Date: ____________ Movements: ____________ Start time: ____________ Finish time: ____________ Date: ____________ Movements: ____________ Start time: ____________ Finish time: ____________ Date: ____________ Movements: ____________ Start time: ____________ Finish time: ____________ Date: ____________ Movements: ____________ Start time: ____________ Finish time: ____________  Date: ____________ Movements: ____________ Start time: ____________ Finish time: ____________ Date: ____________ Movements: ____________ Start time: ____________ Finish time: ____________ Date: ____________ Movements: ____________ Start time: ____________ Finish  time: ____________ Date: ____________ Movements: ____________ Start time: ____________ Finish time: ____________ Date: ____________ Movements: ____________ Start time: ____________ Finish time: ____________ Date: ____________ Movements: ____________ Start time: ____________ Finish time: ____________ Date: ____________ Movements: ____________ Start time: ____________ Finish time: ____________  Date: ____________ Movements: ____________ Start time: ____________ Finish time: ____________ Date: ____________ Movements: ____________ Start time: ____________ Finish time: ____________ Date: ____________ Movements: ____________ Start time: ____________ Finish time: ____________ Date: ____________ Movements: ____________ Start time: ____________ Finish time: ____________ Date: ____________ Movements: ____________ Start time: ____________ Finish time: ____________ Date: ____________ Movements: ____________ Start time: ____________ Finish time: ____________ Document Released: 04/04/2006 Document Revised: 02/22/2011 Document Reviewed: 10/05/2008 [employer] Patient Information 2012 [employer]. 

## 2011-08-21 ENCOUNTER — Encounter (HOSPITAL_COMMUNITY): Payer: Self-pay | Admitting: *Deleted

## 2011-08-21 ENCOUNTER — Ambulatory Visit (INDEPENDENT_AMBULATORY_CARE_PROVIDER_SITE_OTHER): Payer: Medicaid Other | Admitting: Family Medicine

## 2011-08-21 ENCOUNTER — Telehealth (HOSPITAL_COMMUNITY): Payer: Self-pay | Admitting: *Deleted

## 2011-08-21 DIAGNOSIS — Z3480 Encounter for supervision of other normal pregnancy, unspecified trimester: Secondary | ICD-10-CM

## 2011-08-21 NOTE — Telephone Encounter (Signed)
Preadmission screen  

## 2011-08-21 NOTE — Progress Notes (Signed)
Addendum to subjective: patient is not taking flexeril.

## 2011-08-21 NOTE — Progress Notes (Signed)
S: Patient reports good fetal movement today. She went to MAU last PM for decreased fetal movement and had a reassuring  tracing.  Admits to persistent low back pain since this pregnancy. Taking flexeril. Not taking percocet or Vicodin.  O: as per flowsheet  A/P:  26 yo multigravida at [redacted]w[redacted]d based on T1 ultrasound.  -GBS negative. Reviewed prenatal labs.  -Girl baby  -Depo for contraception  - Reviewed kick counts and signs of labor.  -f/u in 1 week. -BPP and NST next week for postdates. -induction of labor scheduled for 09/29/11 at 7:30 AM.

## 2011-08-21 NOTE — Patient Instructions (Addendum)
[place],  Thank you for coming in today. Please f/u in one week for f/u OB visit. Please go to your NST and BPP: 08/28/11 at 9 AM.    Your induction has been scheduled for: 08/30/11 at 7:30 AM you be 40 weeks and 5 days. Present to MAU.   Please watch out for signs of  labor: vaginal bleeding, leakage or gush of fluids, 4 or more contractions for 2 or more hours. Please call the clinic if any of these occur and go to [facility].  Please watch out for decreased fetal movement: If you feel that the baby is not moving as well as usual. You should count kicks. Go to a quite room and count each kick. Anything less than 10 kicks an hour for 2 or more hours is decreased/low. If this happens try drinking cool water. If that does not help. Call the clinic/[facility] to [facility] to be [facility].    Dr. Armen Pickup

## 2011-08-23 ENCOUNTER — Inpatient Hospital Stay (HOSPITAL_COMMUNITY)
Admission: AD | Admit: 2011-08-23 | Discharge: 2011-08-23 | Disposition: A | Discharge status: Home / Self Care | Payer: Medicaid Other | Source: Ambulatory Visit | Attending: Obstetrics and Gynecology | Admitting: Obstetrics and Gynecology

## 2011-08-23 ENCOUNTER — Encounter (HOSPITAL_COMMUNITY): Payer: Self-pay | Admitting: *Deleted

## 2011-08-23 DIAGNOSIS — Z3490 Encounter for supervision of normal pregnancy, unspecified, unspecified trimester: Secondary | ICD-10-CM

## 2011-08-23 DIAGNOSIS — O99891 Other specified diseases and conditions complicating pregnancy: Secondary | ICD-10-CM | POA: Insufficient documentation

## 2011-08-23 DIAGNOSIS — Z3480 Encounter for supervision of other normal pregnancy, unspecified trimester: Secondary | ICD-10-CM

## 2011-08-23 LAB — WET PREP, GENITAL
Trich, Wet Prep: NONE SEEN
Yeast Wet Prep HPF POC: NONE SEEN

## 2011-08-23 LAB — AMNISURE RUPTURE OF MEMBRANE (ROM) NOT AT ARMC: Amnisure ROM: NEGATIVE

## 2011-08-23 NOTE — MAU Note (Signed)
Possible of rupture of membranes around 2350. Some spotting. Good fetal movement

## 2011-08-23 NOTE — Discharge Instructions (Signed)
Normal Labor and Delivery Your caregiver must first be sure you are in labor. Signs of labor include:  You may pass what is called "the mucus plug" before labor begins. This is a small amount of blood stained mucus.   Regular uterine contractions.   The time between contractions get closer together.   The discomfort and pain gradually gets more intense.   Pains are mostly located in the back.   Pains get worse when walking.   The cervix (the opening of the uterus becomes thinner (begins to efface) and opens up (dilates).  Once you are in labor and admitted into the hospital or care center, your caregiver will do the following:  A complete physical examination.   Check your vital signs (blood pressure, pulse, temperature and the fetal heart rate).   Do a vaginal examination (using a sterile glove and lubricant) to determine:   The position (presentation) of the baby (head [vertex] or buttock first).   The level (station) of the baby's head in the birth canal.   The effacement and dilatation of the cervix.   You may have your pubic hair shaved and be given an enema depending on your caregiver and the circumstance.   An electronic monitor is usually placed on your abdomen. The monitor follows the length and intensity of the contractions, as well as the baby's heart rate.   Usually, your caregiver will insert an IV in your arm with a bottle of sugar water. This is done as a precaution so that medications can be given to you quickly during labor or delivery.  NORMAL LABOR AND DELIVERY IS DIVIDED UP INTO 3 STAGES: First Stage This is when regular contractions begin and the cervix begins to efface and dilate. This stage can last from 3 to 15 hours. The end of the first stage is when the cervix is 100% effaced and 10 centimeters dilated. Pain medications may be given by   Injection (morphine, demerol, etc.)   Regional anesthesia (spinal, caudal or epidural, anesthetics given in  different locations of the spine). Paracervical pain medication may be given, which is an injection of and anesthetic on each side of the cervix.  A pregnant woman may request to have "Natural Childbirth" which is not to have any medications or anesthesia during her labor and delivery. Second Stage This is when the baby comes down through the birth canal (vagina) and is born. This can take 1 to 4 hours. As the baby's head comes down through the birth canal, you may feel like you are going to have a bowel movement. You will get the urge to bear down and push until the baby is delivered. As the baby's head is being delivered, the caregiver will decide if an episiotomy (a cut in the perineum and vagina area) is needed to prevent tearing of the tissue in this area. The episiotomy is sewn up after the delivery of the baby and placenta. Sometimes a mask with nitrous oxide is given for the mother to breath during the delivery of the baby to help if there is too much pain. The end of Stage 2 is when the baby is fully delivered. Then when the umbilical cord stops pulsating it is clamped and cut. Third Stage The third stage begins after the baby is completely delivered and ends after the placenta (afterbirth) is delivered. This usually takes 5 to 30 minutes. After the placenta is delivered, a medication is given either by intravenous or injection to help contract   the uterus and prevent bleeding. The third stage is not painful and pain medication is usually not necessary. If an episiotomy was done, it is repaired at this time. After the delivery, the mother is watched and monitored closely for 1 to 2 hours to make sure there is no postpartum bleeding (hemorrhage). If there is a lot of bleeding, medication is given to contract the uterus and stop the bleeding. Document Released: 12/13/2007 Document Revised: 02/22/2011 Document Reviewed: 12/13/2007 [employer] Patient Information 2012 [employer]. 

## 2011-08-23 NOTE — MAU Provider Note (Signed)
Chief Complaint:  Rupture of Membranes   First Provider Initiated Contact with Patient 08/23/11 0036      HPI  Suzanne Ellis is a 26 y.o. [id number] at [redacted]w[redacted]d presenting with leakage of clear fluid x2 episodes described as enough to wet her underwear and make her pants damp, but she has not needed a pad for the leakage.  She reports good fetal movement, denies vaginal bleeding, vaginal itching/burning, urinary symptoms, h/a, dizziness, n/v, or fever/chills.    Pregnancy Course: uncomplicated  Past Medical History: Past Medical History  Diagnosis Date  . No pertinent past medical history   . CHLAMYDTRACHOMATIS INFECTION LOWER GU SITES 07/28/2008    Qualifier: Diagnosis of  By: Jake Shark RN, Amy      Past Surgical History: Past Surgical History  Procedure Date  . Dilation and curettage of uterus   . Arm wound repair / closure   . Termination   . Wisdom tooth extraction     Family History: Family History  Problem Relation Age of Onset  . Diabetes Mother   . Hypertension Mother   . Diabetes Father   . Hypertension Father   . Hypotension Neg Hx   . Malignant hyperthermia Neg Hx   . Pseudochol deficiency Neg Hx   . Anesthesia problems Other     MGF allergic to a common type of anesthesia    Social History: History  Substance Use Topics  . Smoking status: Current Everyday Smoker -- 0.5 packs/day    Types: Cigarettes  . Smokeless tobacco: Never Used  . Alcohol Use: No    Allergies: No Known Allergies  Meds:  Prescriptions prior to admission  Medication Sig Dispense Refill  . Prenatal Vit-Fe Fumarate-FA (PRENATAL MULTIVITAMIN) 60-1 MG tablet Take 1 tablet by mouth daily.  30 tablet  6      Physical Exam  Blood pressure 121/65, pulse 97, temperature 98.2 F (36.8 C), temperature source Oral, resp. rate 18, height 5\' 5"  (1.651 m), weight 61.689 kg (136 lb), last menstrual period 11/18/2010, unknown if currently breastfeeding. GENERAL: Well-developed, well-nourished  female in no acute distress.  HEENT: normocephalic, good dentition HEART: normal rate RESP: normal effort ABDOMEN: Soft, nontender, nondistended, gravid.  EXTREMITIES: Nontender, no edema NEURO: alert and oriented  Pelvic exam: Cervix pink, visually closed, without lesion, moderate amount white creamy discharge, negative pooling, vaginal walls and external genitalia normal Cervical exam deferred    FHT:  Baseline 135 , moderate variability, accelerations present, no decelerations Contractions: occasional   Labs: Results for orders placed during the hospital encounter of 08/23/11 (from the past 24 hour(s))  WET PREP, GENITAL     Status: Abnormal   Collection Time   08/23/11 12:47 AM      Component Value Range   Yeast Wet Prep HPF POC NONE SEEN  NONE SEEN    Trich, Wet Prep NONE SEEN  NONE SEEN    Clue Cells Wet Prep HPF POC NONE SEEN  NONE SEEN    WBC, Wet Prep HPF POC TOO NUMEROUS TO COUNT (*) NONE SEEN   AMNISURE RUPTURE OF MEMBRANE (ROM)     Status: Normal   Collection Time   08/23/11 12:47 AM      Component Value Range   Amnisure ROM NEGATIVE     GC Chlamydia collected   Assessment: Intact membranes Reactive NST   Plan: D/C home with labor precautions F/U with scheduled prenatal visits Return to MAU as needed  LEFTWICH-KIRBY, Enzley Kitchens 6/6/20131:02 AM

## 2011-08-27 ENCOUNTER — Encounter: Payer: Medicaid Other | Admitting: Family Medicine

## 2011-08-27 ENCOUNTER — Encounter (HOSPITAL_COMMUNITY): Payer: Self-pay | Admitting: *Deleted

## 2011-08-27 ENCOUNTER — Inpatient Hospital Stay (HOSPITAL_COMMUNITY)
Admission: AD | Admit: 2011-08-27 | Discharge: 2011-08-27 | Disposition: A | Discharge status: Home / Self Care | Payer: Medicaid Other | Source: Ambulatory Visit | Attending: Obstetrics & Gynecology | Admitting: Obstetrics & Gynecology

## 2011-08-27 DIAGNOSIS — O479 False labor, unspecified: Secondary | ICD-10-CM | POA: Insufficient documentation

## 2011-08-27 NOTE — Discharge Instructions (Signed)
Normal Labor and Delivery Your caregiver must first be sure you are in labor. Signs of labor include:  You may pass what is called "the mucus plug" before labor begins. This is a small amount of blood stained mucus.   Regular uterine contractions.   The time between contractions get closer together.   The discomfort and pain gradually gets more intense.   Pains are mostly located in the back.   Pains get worse when walking.   The cervix (the opening of the uterus becomes thinner (begins to efface) and opens up (dilates).  Once you are in labor and admitted into the hospital or care center, your caregiver will do the following:  A complete physical examination.   Check your vital signs (blood pressure, pulse, temperature and the fetal heart rate).   Do a vaginal examination (using a sterile glove and lubricant) to determine:   The position (presentation) of the baby (head [vertex] or buttock first).   The level (station) of the baby's head in the birth canal.   The effacement and dilatation of the cervix.   You may have your pubic hair shaved and be given an enema depending on your caregiver and the circumstance.   An electronic monitor is usually placed on your abdomen. The monitor follows the length and intensity of the contractions, as well as the baby's heart rate.   Usually, your caregiver will insert an IV in your arm with a bottle of sugar water. This is done as a precaution so that medications can be given to you quickly during labor or delivery.  NORMAL LABOR AND DELIVERY IS DIVIDED UP INTO 3 STAGES: First Stage This is when regular contractions begin and the cervix begins to efface and dilate. This stage can last from 3 to 15 hours. The end of the first stage is when the cervix is 100% effaced and 10 centimeters dilated. Pain medications may be given by   Injection (morphine, demerol, etc.)   Regional anesthesia (spinal, caudal or epidural, anesthetics given in  different locations of the spine). Paracervical pain medication may be given, which is an injection of and anesthetic on each side of the cervix.  A pregnant woman may request to have "Natural Childbirth" which is not to have any medications or anesthesia during her labor and delivery. Second Stage This is when the baby comes down through the birth canal (vagina) and is born. This can take 1 to 4 hours. As the baby's head comes down through the birth canal, you may feel like you are going to have a bowel movement. You will get the urge to bear down and push until the baby is delivered. As the baby's head is being delivered, the caregiver will decide if an episiotomy (a cut in the perineum and vagina area) is needed to prevent tearing of the tissue in this area. The episiotomy is sewn up after the delivery of the baby and placenta. Sometimes a mask with nitrous oxide is given for the mother to breath during the delivery of the baby to help if there is too much pain. The end of Stage 2 is when the baby is fully delivered. Then when the umbilical cord stops pulsating it is clamped and cut. Third Stage The third stage begins after the baby is completely delivered and ends after the placenta (afterbirth) is delivered. This usually takes 5 to 30 minutes. After the placenta is delivered, a medication is given either by intravenous or injection to help contract   the uterus and prevent bleeding. The third stage is not painful and pain medication is usually not necessary. If an episiotomy was done, it is repaired at this time. After the delivery, the mother is watched and monitored closely for 1 to 2 hours to make sure there is no postpartum bleeding (hemorrhage). If there is a lot of bleeding, medication is given to contract the uterus and stop the bleeding. Document Released: 12/13/2007 Document Revised: 02/22/2011 Document Reviewed: 12/13/2007 [employer] Patient Information 2012 [employer]. 

## 2011-08-27 NOTE — MAU Note (Signed)
Patient decided not to ambulate. Monitors reapplied and cervix recheck. SVE unchanged 3/50/-2

## 2011-08-27 NOTE — MAU Note (Signed)
Monitors removed so patient can ambulate.

## 2011-08-27 NOTE — MAU Note (Signed)
Pt complains of contractions, pressure and sharp pains

## 2011-08-27 NOTE — MAU Provider Note (Signed)
Attestation of Attending Supervision of Advanced Practitioner: Evaluation and management procedures were performed by the PA/NP/CNM/OB Fellow under my supervision/collaboration. Chart reviewed and agree with management and plan.  Krissie Merrick V 08/27/2011 7:35 PM    

## 2011-08-28 ENCOUNTER — Ambulatory Visit (INDEPENDENT_AMBULATORY_CARE_PROVIDER_SITE_OTHER): Payer: Medicaid Other | Admitting: *Deleted

## 2011-08-28 VITALS — BP 109/67 | Wt 135.1 lb

## 2011-08-28 DIAGNOSIS — O480 Post-term pregnancy: Secondary | ICD-10-CM

## 2011-08-28 NOTE — Progress Notes (Signed)
P = 91   Pt is scheduled for IOL on 08/30/11 @ 0730

## 2011-08-28 NOTE — Progress Notes (Signed)
NST reviewed and reactive.  

## 2011-08-29 ENCOUNTER — Telehealth: Payer: Self-pay | Admitting: *Deleted

## 2011-08-29 NOTE — Telephone Encounter (Signed)
Patient calling to see whether she needs to go to [facility].  Is [redacted] weeks pregnant.  Had NST & ultrasound yesterday and "fluid was low."  Patient states contractions are 15 minutes apart and some fluid is coming out.  Is scheduled for induction tomorrow morning at 7:30am.  Patient informed to go to [facility] now for evaluation.  Gaylene Brooks, RN

## 2011-08-30 ENCOUNTER — Encounter (HOSPITAL_COMMUNITY): Payer: Self-pay | Admitting: Anesthesiology

## 2011-08-30 ENCOUNTER — Encounter (HOSPITAL_COMMUNITY): Payer: Self-pay | Admitting: *Deleted

## 2011-08-30 ENCOUNTER — Inpatient Hospital Stay (HOSPITAL_COMMUNITY)
Admission: RE | Admit: 2011-08-30 | Payer: Medicaid Other | Source: Ambulatory Visit | Admitting: Obstetrics & Gynecology

## 2011-08-30 ENCOUNTER — Inpatient Hospital Stay (HOSPITAL_COMMUNITY)
Admission: AD | Admit: 2011-08-30 | Discharge: 2011-08-31 | DRG: 775 | Disposition: A | Discharge status: Home / Self Care | Payer: Medicaid Other | Source: Ambulatory Visit | Attending: Obstetrics & Gynecology | Admitting: Obstetrics & Gynecology

## 2011-08-30 ENCOUNTER — Inpatient Hospital Stay (HOSPITAL_COMMUNITY): Payer: Medicaid Other | Admitting: Anesthesiology

## 2011-08-30 DIAGNOSIS — IMO0001 Reserved for inherently not codable concepts without codable children: Secondary | ICD-10-CM

## 2011-08-30 LAB — CBC
HCT: 32.5 % — ABNORMAL LOW (ref 36.0–46.0)
Hemoglobin: 10.5 g/dL — ABNORMAL LOW (ref 12.0–15.0)
MCHC: 32.3 g/dL (ref 30.0–36.0)
MCV: 86.7 fL (ref 78.0–100.0)
RDW: 14.3 % (ref 11.5–15.5)
WBC: 14.1 10*3/uL — ABNORMAL HIGH (ref 4.0–10.5)

## 2011-08-30 LAB — RPR: RPR Ser Ql: NONREACTIVE

## 2011-08-30 MED ORDER — PRENATAL MULTIVITAMIN CH
1.0000 | ORAL_TABLET | Freq: Every day | ORAL | Status: DC
  Administered 2011-08-30 – 2011-08-31 (×2): 1 via ORAL
  Filled 2011-08-30 (×2): qty 1

## 2011-08-30 MED ORDER — TETANUS-DIPHTH-ACELL PERTUSSIS 5-2.5-18.5 LF-MCG/0.5 IM SUSP
0.5000 mL | Freq: Once | INTRAMUSCULAR | Status: AC
  Administered 2011-08-31: 0.5 mL via INTRAMUSCULAR
  Filled 2011-08-30: qty 0.5

## 2011-08-30 MED ORDER — EPHEDRINE 5 MG/ML INJ: 10.0000 mg | INTRAVENOUS | Status: DC | PRN

## 2011-08-30 MED ORDER — FENTANYL 2.5 MCG/ML BUPIVACAINE 1/10 % EPIDURAL INFUSION (WH - ANES)
14.0000 mL/h | INTRAMUSCULAR | Status: DC
  Administered 2011-08-30: 14 mL/h via EPIDURAL
  Filled 2011-08-30 (×2): qty 60

## 2011-08-30 MED ORDER — DIPHENHYDRAMINE HCL 50 MG/ML IJ SOLN: 12.5000 mg | INTRAMUSCULAR | Status: DC | PRN

## 2011-08-30 MED ORDER — SIMETHICONE 80 MG PO CHEW: 80.0000 mg | CHEWABLE_TABLET | ORAL | Status: DC | PRN

## 2011-08-30 MED ORDER — OXYCODONE-ACETAMINOPHEN 5-325 MG PO TABS: 1.0000 | ORAL_TABLET | ORAL | Status: DC | PRN

## 2011-08-30 MED ORDER — SENNOSIDES-DOCUSATE SODIUM 8.6-50 MG PO TABS
2.0000 | ORAL_TABLET | Freq: Every day | ORAL | Status: DC
  Administered 2011-08-30: 2 via ORAL

## 2011-08-30 MED ORDER — PHENYLEPHRINE 40 MCG/ML (10ML) SYRINGE FOR IV PUSH (FOR BLOOD PRESSURE SUPPORT): 80.0000 ug | PREFILLED_SYRINGE | INTRAVENOUS | Status: DC | PRN

## 2011-08-30 MED ORDER — WITCH HAZEL-GLYCERIN EX PADS: 1.0000 "application " | MEDICATED_PAD | CUTANEOUS | Status: DC | PRN

## 2011-08-30 MED ORDER — ACETAMINOPHEN 325 MG PO TABS: 650.0000 mg | ORAL_TABLET | ORAL | Status: DC | PRN

## 2011-08-30 MED ORDER — OXYTOCIN 20 UNITS IN LACTATED RINGERS INFUSION - SIMPLE
125.0000 mL/h | Freq: Once | INTRAVENOUS | Status: AC
  Administered 2011-08-30: 125 mL/h via INTRAVENOUS

## 2011-08-30 MED ORDER — LIDOCAINE HCL (PF) 1 % IJ SOLN: 30.0000 mL | INTRAMUSCULAR | Status: DC | PRN

## 2011-08-30 MED ORDER — ZOLPIDEM TARTRATE 5 MG PO TABS: 5.0000 mg | ORAL_TABLET | Freq: Every evening | ORAL | Status: DC | PRN

## 2011-08-30 MED ORDER — CITRIC ACID-SODIUM CITRATE 334-500 MG/5ML PO SOLN: 30.0000 mL | ORAL | Status: DC | PRN

## 2011-08-30 MED ORDER — HYDROXYZINE HCL 50 MG PO TABS: 50.0000 mg | ORAL_TABLET | Freq: Four times a day (QID) | ORAL | Status: DC | PRN

## 2011-08-30 MED ORDER — OXYTOCIN 20 UNITS IN LACTATED RINGERS INFUSION - SIMPLE
1.0000 m[IU]/min | INTRAVENOUS | Status: DC
  Administered 2011-08-30: 2 m[IU]/min via INTRAVENOUS
  Filled 2011-08-30: qty 1000

## 2011-08-30 MED ORDER — FLEET ENEMA 7-19 GM/118ML RE ENEM: 1.0000 | ENEMA | RECTAL | Status: DC | PRN

## 2011-08-30 MED ORDER — NALBUPHINE SYRINGE 5 MG/0.5 ML: 5.0000 mg | INJECTION | INTRAMUSCULAR | Status: DC | PRN

## 2011-08-30 MED ORDER — DIBUCAINE 1 % RE OINT
1.0000 "application " | TOPICAL_OINTMENT | RECTAL | Status: DC | PRN
  Filled 2011-08-30: qty 28

## 2011-08-30 MED ORDER — BENZOCAINE-MENTHOL 20-0.5 % EX AERO
1.0000 "application " | INHALATION_SPRAY | CUTANEOUS | Status: DC | PRN
  Filled 2011-08-30: qty 56

## 2011-08-30 MED ORDER — LACTATED RINGERS IV SOLN
INTRAVENOUS | Status: DC
  Administered 2011-08-30: 05:00:00 via INTRAVENOUS

## 2011-08-30 MED ORDER — LACTATED RINGERS IV SOLN
500.0000 mL | Freq: Once | INTRAVENOUS | Status: AC
  Administered 2011-08-30: 500 mL via INTRAVENOUS

## 2011-08-30 MED ORDER — LANOLIN HYDROUS EX OINT: TOPICAL_OINTMENT | CUTANEOUS | Status: DC | PRN

## 2011-08-30 MED ORDER — EPHEDRINE 5 MG/ML INJ
10.0000 mg | INTRAVENOUS | Status: DC | PRN
  Filled 2011-08-30: qty 4

## 2011-08-30 MED ORDER — LIDOCAINE HCL (PF) 1 % IJ SOLN
INTRAMUSCULAR | Status: DC | PRN
  Administered 2011-08-30 (×2): 8 mL

## 2011-08-30 MED ORDER — OXYTOCIN BOLUS FROM INFUSION
500.0000 mL | Freq: Once | INTRAVENOUS | Status: DC
  Filled 2011-08-30: qty 500

## 2011-08-30 MED ORDER — HYDROXYZINE HCL 50 MG/ML IM SOLN: 50.0000 mg | Freq: Four times a day (QID) | INTRAMUSCULAR | Status: DC | PRN

## 2011-08-30 MED ORDER — ONDANSETRON HCL 4 MG/2ML IJ SOLN
4.0000 mg | Freq: Four times a day (QID) | INTRAMUSCULAR | Status: DC | PRN
  Administered 2011-08-30: 4 mg via INTRAVENOUS
  Filled 2011-08-30: qty 2

## 2011-08-30 MED ORDER — FENTANYL 2.5 MCG/ML BUPIVACAINE 1/10 % EPIDURAL INFUSION (WH - ANES)
INTRAMUSCULAR | Status: DC | PRN
  Administered 2011-08-30: 13 mL/h via EPIDURAL

## 2011-08-30 MED ORDER — IBUPROFEN 600 MG PO TABS
600.0000 mg | ORAL_TABLET | Freq: Four times a day (QID) | ORAL | Status: DC
  Administered 2011-08-30 – 2011-08-31 (×5): 600 mg via ORAL
  Filled 2011-08-30 (×5): qty 1

## 2011-08-30 MED ORDER — OXYCODONE-ACETAMINOPHEN 5-325 MG PO TABS
1.0000 | ORAL_TABLET | ORAL | Status: DC | PRN
  Administered 2011-08-30 (×3): 1 via ORAL
  Administered 2011-08-31: 2 via ORAL
  Filled 2011-08-30 (×3): qty 1
  Filled 2011-08-30: qty 2

## 2011-08-30 MED ORDER — IBUPROFEN 600 MG PO TABS: 600.0000 mg | ORAL_TABLET | Freq: Four times a day (QID) | ORAL | Status: DC | PRN

## 2011-08-30 MED ORDER — TERBUTALINE SULFATE 1 MG/ML IJ SOLN: 0.2500 mg | Freq: Once | INTRAMUSCULAR | Status: DC | PRN

## 2011-08-30 MED ORDER — ONDANSETRON HCL 4 MG PO TABS: 4.0000 mg | ORAL_TABLET | ORAL | Status: DC | PRN

## 2011-08-30 MED ORDER — PHENYLEPHRINE 40 MCG/ML (10ML) SYRINGE FOR IV PUSH (FOR BLOOD PRESSURE SUPPORT)
80.0000 ug | PREFILLED_SYRINGE | INTRAVENOUS | Status: DC | PRN
  Filled 2011-08-30: qty 5

## 2011-08-30 MED ORDER — DIPHENHYDRAMINE HCL 25 MG PO CAPS: 25.0000 mg | ORAL_CAPSULE | Freq: Four times a day (QID) | ORAL | Status: DC | PRN

## 2011-08-30 MED ORDER — ONDANSETRON HCL 4 MG/2ML IJ SOLN: 4.0000 mg | INTRAMUSCULAR | Status: DC | PRN

## 2011-08-30 MED ORDER — LACTATED RINGERS IV SOLN: 500.0000 mL | INTRAVENOUS | Status: DC | PRN

## 2011-08-30 NOTE — Anesthesia Preprocedure Evaluation (Signed)
Anesthesia Evaluation  Patient identified by MRN, date of birth, ID band Patient awake    Reviewed: Allergy & Precautions, H&P , NPO status , Patient's Chart, lab work & pertinent test results  Airway Mallampati: I TM Distance: >3 FB Neck ROM: full    Dental No notable dental hx.    Pulmonary neg pulmonary ROS,  breath sounds clear to auscultation  Pulmonary exam normal       Cardiovascular negative cardio ROS      Neuro/Psych    GI/Hepatic negative GI ROS, Neg liver ROS,   Endo/Other  negative endocrine ROS  Renal/GU negative Renal ROS  negative genitourinary   Musculoskeletal negative musculoskeletal ROS (+)   Abdominal Normal abdominal exam  (+)   Peds negative pediatric ROS (+)  Hematology negative hematology ROS (+)   Anesthesia Other Findings   Reproductive/Obstetrics (+) Pregnancy                           Anesthesia Physical Anesthesia Plan  ASA: II  Anesthesia Plan: Epidural   Post-op Pain Management:    Induction:   Airway Management Planned:   Additional Equipment:   Intra-op Plan:   Post-operative Plan:   Informed Consent: I have reviewed the patients History and Physical, chart, labs and discussed the procedure including the risks, benefits and alternatives for the proposed anesthesia with the patient or authorized representative who has indicated his/her understanding and acceptance.     Plan Discussed with:   Anesthesia Plan Comments:         Anesthesia Quick Evaluation  

## 2011-08-30 NOTE — H&P (Signed)
HPI: Suzanne Ellis is a 26 y.o. year old [id number] female at [redacted]w[redacted]d weeks gestation who presents to MAU reporting strong, close contractions. He reports pos FM and denies LOF or VB. She has recieved prenatal care at [facility] w/ Dr. Logan Bores, Ashley Royalty and Rivka Safer. She is scheduled for IOL at 0730, but stated thst she couldn't wait due to worsening contractions.   Maternal Medical History:  Reason for admission: Reason for admission: contractions.  Contractions: Frequency: regular.   Perceived severity is strong.    Fetal activity: Perceived fetal activity is normal.    Prenatal Complications - Diabetes: none.    OB History    Grav Para Term Preterm Abortions TAB SAB Ect Mult Living   5 2 2  0 2 2 0 0 0 2     Past Medical History  Diagnosis Date  . No pertinent past medical history   . CHLAMYDTRACHOMATIS INFECTION LOWER GU SITES 07/28/2008    Qualifier: Diagnosis of  By: Jake Shark RN, Amy     Past Surgical History  Procedure Date  . Dilation and curettage of uterus   . Arm wound repair / closure   . Termination   . Wisdom tooth extraction    Family History: family history includes Anesthesia problems in her other; Diabetes in her father and mother; and Hypertension in her father and mother.  There is no history of Hypotension, and Malignant hyperthermia, and Pseudochol deficiency, . Social History:  reports that she has been smoking Cigarettes.  She has been smoking about .5 packs per day. She has never used smokeless tobacco. She reports that she does not drink alcohol or use illicit drugs.  Review of Systems  Constitutional: Negative for fever and chills.  Eyes: Negative for blurred vision.  Neurological: Negative for headaches.    Dilation: 3 Effacement (%): 80 Station: -2 Exam by:: Weston,rn Blood pressure 118/71, pulse 89, temperature 99 F (37.2 C), temperature source Oral, resp. rate 20, height 5\' 5"  (1.651 m), weight 62.596 kg (138 lb), last menstrual period 11/18/2010, SpO2  100.00%. Maternal Exam:  Uterine Assessment: Contraction frequency is regular.   Abdomen: Patient reports no abdominal tenderness. Fetal presentation: vertex  Introitus: Normal vulva. Normal vagina.  Pelvis: adequate for delivery.   Cervix: Cervix evaluated by digital exam.     Fetal Exam Fetal Monitor Review: Mode: ultrasound.   Baseline rate: 125.  Variability: moderate (6-25 bpm).   Pattern: accelerations present and no decelerations.    Fetal State Assessment: Category I - tracings are normal.     Physical Exam  Nursing note and vitals reviewed. Constitutional: She is oriented to person, place, and time. She appears well-developed and well-nourished. She appears distressed.  HENT:  Head: Normocephalic.  Eyes: Conjunctivae are normal. Pupils are equal, round, and reactive to light.  Cardiovascular: Normal rate and regular rhythm.   Respiratory: Effort normal and breath sounds normal.  GI: Soft.  Neurological: She is alert and oriented to person, place, and time.  Skin: Skin is warm and dry.  Psychiatric: She has a normal mood and affect.    Prenatal labs: ABO, Rh: O/POS/-- (12/17 1652) Antibody: NEG (12/17 1652) Rubella: 12.4 (12/17 1652) RPR: NON REAC (03/22 1005)  HBsAg: NEGATIVE (12/17 1652)  HIV: NON REACTIVE (03/22 1005)  GBS: NEGATIVE (05/29 1131)  1 hour GTT 69 Quad screen normal  Assessment: 1. Labor: early 2. Fetal Wellbeing: Category I  3. Pain Control: none 4. GBS: neg 5. 40.5 week IUP  Plan:  1. Admit  to [facility] per consult with MD 2. Routine L&D orders 3. Analgesia/anesthesia PRN   Dorathy Kinsman 08/30/2011, 5:24 AM

## 2011-08-30 NOTE — Anesthesia Procedure Notes (Signed)
Epidural Patient location during procedure: OB Start time: 08/30/2011 6:27 AM End time: 08/30/2011 6:32 AM Reason for block: procedure for pain  Staffing Anesthesiologist: Sandrea Hughs Performed by: anesthesiologist   Preanesthetic Checklist Completed: patient identified, site marked, surgical consent, pre-op evaluation, timeout performed, IV checked, risks and benefits discussed and monitors and equipment checked  Epidural Patient position: sitting Prep: site prepped and draped and DuraPrep Patient monitoring: continuous pulse ox and blood pressure Approach: midline Injection technique: LOR air  Needle:  Needle type: Tuohy  Needle gauge: 17 G Needle length: 9 cm Needle insertion depth: 5 cm cm Catheter type: closed end flexible Catheter size: 19 Gauge Catheter at skin depth: 10 cm Test dose: negative and Other  Assessment Sensory level: T10 Events: blood not aspirated, injection not painful, no injection resistance, negative IV test and no paresthesia

## 2011-08-30 NOTE — MAU Note (Signed)
Pt presents with complaint of contractions, bleeding

## 2011-08-31 ENCOUNTER — Telehealth: Payer: Self-pay | Admitting: Family Medicine

## 2011-08-31 ENCOUNTER — Ambulatory Visit (INDEPENDENT_AMBULATORY_CARE_PROVIDER_SITE_OTHER): Payer: Medicaid Other | Admitting: *Deleted

## 2011-08-31 ENCOUNTER — Telehealth: Payer: Self-pay | Admitting: *Deleted

## 2011-08-31 DIAGNOSIS — Z309 Encounter for contraceptive management, unspecified: Secondary | ICD-10-CM

## 2011-08-31 LAB — CBC
HCT: 31.8 % — ABNORMAL LOW (ref 36.0–46.0)
Hemoglobin: 10.2 g/dL — ABNORMAL LOW (ref 12.0–15.0)
MCH: 27.9 pg (ref 26.0–34.0)
MCHC: 32.1 g/dL (ref 30.0–36.0)
MCV: 86.9 fL (ref 78.0–100.0)
RBC: 3.66 MIL/uL — ABNORMAL LOW (ref 3.87–5.11)

## 2011-08-31 MED ORDER — IBUPROFEN 600 MG PO TABS: 600.0000 mg | ORAL_TABLET | Freq: Four times a day (QID) | ORAL | Status: AC

## 2011-08-31 MED ORDER — MEDROXYPROGESTERONE ACETATE 150 MG/ML IM SUSP
150.0000 mg | Freq: Once | INTRAMUSCULAR | Status: AC
  Administered 2011-08-31: 150 mg via INTRAMUSCULAR

## 2011-08-31 NOTE — Telephone Encounter (Signed)
Patient calls from hospital  Stating she cannot receive depo at the hospital.  She wants to come to our office and receive . Will send message to Dr. Denyse Amass to advice and place order.

## 2011-08-31 NOTE — Telephone Encounter (Signed)
Will come today for Depot. No need for Upreg as she just had her baby and cannot possibly be pregnant.

## 2011-08-31 NOTE — Anesthesia Postprocedure Evaluation (Signed)
  Anesthesia Post-op Note  Patient: Suzanne Ellis  Procedure(s) Performed: * No procedures listed *  Patient Location: PACU and Mother/Baby  Anesthesia Type: Epidural  Level of Consciousness: awake, alert  and oriented  Airway and Oxygen Therapy: Patient Spontanous Breathing  Post-op Pain: none  Post-op Assessment: Post-op Vital signs reviewed  Post-op Vital Signs: Reviewed and stable  Complications: No apparent anesthesia complications

## 2011-08-31 NOTE — Discharge Instructions (Signed)

## 2011-08-31 NOTE — Telephone Encounter (Signed)
See other note

## 2011-08-31 NOTE — Progress Notes (Signed)
Patient in office to receive Depo. States she couldn't get at the hospital. She delivered baby, yesterday. Dr. Denyse Amass gave verbal order to administer Depo Provera 150 mg IM today.

## 2011-08-31 NOTE — Telephone Encounter (Signed)
Pt want to have a depo when she leave hospital today.  Just delivered yesterday.  Informed that she need to be here before 3:00.  Number left is from her hosp room

## 2011-08-31 NOTE — Telephone Encounter (Signed)
Will forward to Dr. Denyse Amass for order.

## 2011-08-31 NOTE — Progress Notes (Signed)
Patient was referred for history of depression/anxiety. * Referral screened out by Clinical Social Worker because none of the following criteria appear to apply: ~ History of anxiety/depression during this pregnancy, or of post-partum depression. ~ Diagnosis of anxiety and/or depression within last 3 years ~ History of depression due to pregnancy loss/loss of child OR * Patient's symptoms currently being treated with medication and/or therapy. Please contact the Clinical Social Worker if needs arise, or if patient requests.  No history of depression was seen in chart by SW or bedside RN.  Bedside RN has no current concerns and will call SW if issues arise.

## 2011-08-31 NOTE — Progress Notes (Signed)
UR chart review completed.  

## 2011-08-31 NOTE — Discharge Summary (Signed)
Obstetric Discharge Summary Reason for Admission: onset of labor Prenatal Procedures: none Intrapartum Procedures: none Postpartum Procedures: none Complications-Operative and Postpartum: none Hemoglobin  Date Value Range Status  08/31/2011 10.2* 12.0 - 15.0 g/dL Final     HCT  Date Value Range Status  08/31/2011 31.8* 36.0 - 46.0 % Final    Physical Exam:  General: alert, cooperative, appears stated age and no distress Lochia: appropriate Uterine Fundus: firm DVT Evaluation: No evidence of DVT seen on physical exam. Negative Homan's sign. No cords or calf tenderness. No significant calf/ankle edema.  Discharge Diagnoses: Term Pregnancy-delivered  Discharge Information: Date: 08/31/2011 Activity: pelvic rest Diet: routine Medications: Ibuprofen Condition: stable Instructions: refer to practice specific booklet Discharge to: home Follow-up Information    Follow up with Dessa Phi, MD in 6 weeks.   Contact information:   [address] [place] [address] [phone]          Newborn Data: Live born female  Birth Weight: 6 lb 5.9 oz (2890 g) APGAR: 9, 9  Home with mother. Plans Depo-Provera  Breast   Andrena Mews, DO [facility] Resident - PGY-1 08/31/2011 7:40 AM   Patient seen and examined.  Agree with above note.  Levie Heritage, DO 08/31/2011 7:54 AM

## 2011-09-01 ENCOUNTER — Telehealth: Payer: Self-pay | Admitting: Family Medicine

## 2011-09-01 NOTE — Telephone Encounter (Signed)
Received call from patient that she was having strong uterine cramping, especially with breast feeding.  Also she had chills last pm.  Has not picked up rx for ibuprofen, took 400mg  ibuprofen this am.  Advised to go ahead and take an additional 400mg  now and check temperature since she was having chills.  Lochia amount sounds normal, no odor.  If she is febrile told her to go to [facility] hospital.

## 2011-09-04 ENCOUNTER — Ambulatory Visit: Payer: Medicaid Other | Admitting: Family Medicine

## 2011-09-06 ENCOUNTER — Telehealth: Payer: Self-pay | Admitting: Family Medicine

## 2011-09-06 NOTE — Telephone Encounter (Signed)
Wants to if she can wear a nicotine patch if she is breast feeding

## 2011-09-06 NOTE — Telephone Encounter (Signed)
Returned call to patient. Has smoked "a cigarette" since she had the baby.  Wants to know if she can use nicotine patch.  Advised not to use nicotine patch while breastfeeding because nicotine can be passed to baby with feedings.  Patient verbalized understanding and states she "will hold off for as long as she can."  Senaida Ores, Maryjean Ka, RN

## 2011-09-07 ENCOUNTER — Ambulatory Visit: Payer: Medicaid Other | Admitting: Family Medicine

## 2011-09-17 ENCOUNTER — Telehealth: Payer: Self-pay | Admitting: Family Medicine

## 2011-09-17 NOTE — Telephone Encounter (Signed)
Is breast feeding and wants to know if she can take metronidazole for her BV

## 2011-09-17 NOTE — Telephone Encounter (Signed)
Consulted with Dr. Earnest Bailey and Dr.Jordan and they advise will be OK  for patient to take metronidazole with breast feeding. Patient notified.

## 2011-09-19 ENCOUNTER — Ambulatory Visit (INDEPENDENT_AMBULATORY_CARE_PROVIDER_SITE_OTHER): Payer: Medicaid Other | Admitting: Family Medicine

## 2011-09-19 ENCOUNTER — Encounter: Payer: Self-pay | Admitting: Family Medicine

## 2011-09-19 VITALS — BP 115/79 | HR 102 | Wt 127.0 lb

## 2011-09-19 DIAGNOSIS — N898 Other specified noninflammatory disorders of vagina: Secondary | ICD-10-CM

## 2011-09-19 MED ORDER — METRONIDAZOLE 500 MG PO TABS: 500.0000 mg | ORAL_TABLET | Freq: Two times a day (BID) | ORAL | Status: DC

## 2011-09-19 MED ORDER — CLINDAMYCIN HCL 300 MG PO CAPS: 300.0000 mg | ORAL_CAPSULE | Freq: Three times a day (TID) | ORAL | Status: AC

## 2011-09-19 NOTE — Progress Notes (Signed)
Subjective:     Patient ID: Suzanne Ellis, female   DOB: [birth year], 26 y.o.   MRN: [id number]  HPI 26 yo F presents two week post partum from NSVD presents with a compliant of persistent vaginal bleeding that is malodorous. She denies associated discharge,  pelvic/abdominal pain, and fever. She took a few old flagyl which improved the odor. She denies being sexually active.  She is breast feeding.   Review of Systems As per HPI   Objective:   Physical Exam BP 115/79  Pulse 102  Wt 127 lb (57.607 kg)  Breastfeeding? Yes General appearance: alert, cooperative and no distress Abdomen: soft, non-tender; bowel sounds normal; no masses,  no organomegaly Pelvic: cervix normal in appearance, external genitalia normal, no adnexal masses or tenderness, no cervical motion tenderness, uterus normal size, shape, and consistency and vagina with scant mucoid/bloody vaginal discharge.     Assessment and Plan:

## 2011-09-19 NOTE — Patient Instructions (Addendum)
Suzanne Ellis,  For your vaginal discharge we will treat with clindamycin 300 mg three times daily x 7 days.  Make a 6 week post partum f/u with Dr. Ashley Royalty.  Dr. Armen Pickup

## 2011-09-19 NOTE — Assessment & Plan Note (Addendum)
A: likely routine post partum discharge but given improvement with flagyl while continue treatment for BV. Patient with negative Gc/Chlam on recent 35 week labs (08/23/11) and not sexually active.  P: Change to clindamycin since flagly is not recommend for breastfeeding moms since it crosses the placenta.

## 2011-09-28 ENCOUNTER — Encounter: Payer: Self-pay | Admitting: Family Medicine

## 2011-09-28 NOTE — Telephone Encounter (Signed)
This encounter was created in error - please disregard.

## 2011-09-28 NOTE — Telephone Encounter (Signed)
error 

## 2011-10-08 ENCOUNTER — Encounter (HOSPITAL_COMMUNITY): Payer: Self-pay

## 2011-10-08 ENCOUNTER — Emergency Department (INDEPENDENT_AMBULATORY_CARE_PROVIDER_SITE_OTHER)
Admission: EM | Admit: 2011-10-08 | Discharge: 2011-10-08 | Disposition: A | Discharge status: Home / Self Care | Payer: Medicaid Other | Source: Home / Self Care | Attending: Emergency Medicine | Admitting: Emergency Medicine

## 2011-10-08 DIAGNOSIS — R21 Rash and other nonspecific skin eruption: Secondary | ICD-10-CM

## 2011-10-08 NOTE — ED Notes (Signed)
Patient concerned about insect bite , concern for spreading on her and to her child

## 2011-10-08 NOTE — ED Provider Notes (Signed)
History     CSN: [id number]  Arrival date & time 10/08/11  1417   First MD Initiated Contact with Patient 10/08/11 1655      Chief Complaint  Patient presents with  . Insect Bite    (Consider location/radiation/quality/duration/timing/severity/associated sxs/prior treatment) HPI Comments: Patient presents to urgent care concerned that she had developed a rash in her left hand and now it's and if left forearm somewhat itchy red and she also make sure that she cannot transmit this rash to her newborn child. She denies having visualized any insect biting her, or any other family members at home with a similar rash except for her newborn baby that has a facial rash which she was told it was acne of her newborn she also wanted to confirm that. No respiratory problems difficulty swallowing.  The history is provided by the patient.    Past Medical History  Diagnosis Date  . No pertinent past medical history   . CHLAMYDTRACHOMATIS INFECTION LOWER GU SITES 07/28/2008    Qualifier: Diagnosis of  By: Jake Shark RN, Amy      Past Surgical History  Procedure Date  . Dilation and curettage of uterus   . Arm wound repair / closure   . Termination   . Wisdom tooth extraction     Family History  Problem Relation Age of Onset  . Diabetes Mother   . Hypertension Mother   . Diabetes Father   . Hypertension Father   . Hypotension Neg Hx   . Malignant hyperthermia Neg Hx   . Pseudochol deficiency Neg Hx   . Anesthesia problems Other     MGF allergic to a common type of anesthesia    History  Substance Use Topics  . Smoking status: Current Everyday Smoker -- 0.5 packs/day    Types: Cigarettes  . Smokeless tobacco: Never Used  . Alcohol Use: No    OB History    Grav Para Term Preterm Abortions TAB SAB Ect Mult Living   5 3 3  0 2 2 0 0 0 3      Review of Systems  Constitutional: Negative for chills, activity change and appetite change.  Skin: Positive for rash. Negative for color  change and wound.    Allergies  Review of patient's allergies indicates no known allergies.  Home Medications   Current Outpatient Rx  Name Route Sig Dispense Refill  . PRENATAL RX 60-1 MG PO TABS Oral Take 1 tablet by mouth daily. 30 tablet 6    BP 128/73  Pulse 100  Temp 98.3 F (36.8 C) (Oral)  Resp 20  SpO2 100%  Physical Exam  Nursing note and vitals reviewed. Constitutional: Vital signs are normal. She appears well-developed and well-nourished.  Non-toxic appearance. She does not have a sickly appearance. She does not appear ill. No distress.    Neurological: She is alert.  Skin: Rash noted. There is erythema. There is pallor.    ED Course  Procedures (including critical care time)  Labs Reviewed - No data to display No results found.   1. Papular eruption       MDM  Papular erythematous eruption consistent with heat rash versus contact dermatitis. Otherwise patient to use hydrocortisone to reassure her that this was not bedbugs, flea bites or infectious in character. She was concern about her newborn been exposed to this rash.        Jimmie Molly, MD 10/08/11 1758

## 2011-10-16 ENCOUNTER — Ambulatory Visit: Payer: Medicaid Other | Admitting: Family Medicine

## 2011-10-17 ENCOUNTER — Ambulatory Visit (INDEPENDENT_AMBULATORY_CARE_PROVIDER_SITE_OTHER): Payer: Medicaid Other | Admitting: Family Medicine

## 2011-10-17 NOTE — Assessment & Plan Note (Signed)
A: 6 postpartum exam. Pap smear not done at today's visit not due until 2015.  Plan: 1. Contraception: Depo-Provera injections 2.  Follow up in: 6 weeks for Depo and needed with PCP.

## 2011-10-17 NOTE — Patient Instructions (Addendum)
Natascha,  Next depo due 12/01/11.  Make sure to take time for yourself. You are bonding well with Clearance Coots.  Dr. Armen Pickup

## 2011-10-17 NOTE — Progress Notes (Signed)
Patient ID: Suzanne Ellis, female   DOB: 04-20-1985, 26 y.o.   MRN: [id number] Subjective:     Suzanne Ellis is a 26 y.o. female who presents for a postpartum visit. She is 6 weeks postpartum following a spontaneous vaginal delivery. I have fully reviewed the prenatal and intrapartum course. The delivery was at 41 gestational weeks. Outcome: spontaneous vaginal delivery. Anesthesia: epidural. Postpartum course has been uncomplicated. Baby's course has been complicated for neonatal acne and reflux. Baby is feeding by breast. Bleeding no bleeding. Bowel function is normal. Bladder function is normal. Patient is not sexually active. Contraception method is Depo-Provera injections. Postpartum depression screening: negative. Score of 0.   Review of Systems Pertinent items are noted in HPI.   Objective:    There were no vitals taken for this visit.  General:  alert, cooperative and no distress   Breasts:  inspection negative, no nipple discharge or bleeding, no masses or nodularity palpable  Abdomen: soft, non-tender; bowel sounds normal; no masses,  no organomegaly   Vulva:  normal  Vagina: normal vagina  Cervix:  no cervical motion tenderness  Corpus: normal  Adnexa:  normal adnexa  Rectal Exam: Not performed.        Assessment:    6 postpartum exam. Pap smear not done at today's visit not due until 2015.   Plan:    1. Contraception: Depo-Provera injections 2.  Follow up in: 6 weeks for Depo and needed with PCP.

## 2011-11-02 ENCOUNTER — Telehealth: Payer: Self-pay | Admitting: Family Medicine

## 2011-11-02 NOTE — Telephone Encounter (Signed)
Pt is currently taking clindamycin and flagyl and she has tried an OTC cream and feels that her SX have changed appt made for 8.19.13.Hiliana Eilts, Viann Shove

## 2011-11-05 ENCOUNTER — Ambulatory Visit: Payer: Medicaid Other | Admitting: Family Medicine

## 2011-11-13 ENCOUNTER — Ambulatory Visit (INDEPENDENT_AMBULATORY_CARE_PROVIDER_SITE_OTHER): Payer: Medicaid Other | Admitting: Family Medicine

## 2011-11-13 VITALS — BP 115/74 | HR 88 | Ht 65.0 in | Wt 135.0 lb

## 2011-11-13 DIAGNOSIS — F489 Nonpsychotic mental disorder, unspecified: Secondary | ICD-10-CM

## 2011-11-13 DIAGNOSIS — F418 Other specified anxiety disorders: Secondary | ICD-10-CM

## 2011-11-13 DIAGNOSIS — O99345 Other mental disorders complicating the puerperium: Secondary | ICD-10-CM

## 2011-11-13 NOTE — Patient Instructions (Addendum)
Thank you for coming in today, it was good to see you It is great that you are sticking with breastfeeding, even though it can be tough sometimes Try to make some time for yourself and allow other family members to help take care of baby and feed her You can contact the lactation dept at [facility] hospital as well Remember every baby is different and no one thing works for every baby  There is also a breast feeding support group that meets every week and it may be worthwhile to talk with others who are having similar problems. This moms' group meets every Tuesday at 11am. Led by a Certified Lactation Consultant This is a free support group Contact Childbirth Education at [phone] or [phone],  Or you can call and see if you can meet one on one with them.  Also ask about renting a breast pump to use.

## 2011-11-19 DIAGNOSIS — F418 Other specified anxiety disorders: Secondary | ICD-10-CM | POA: Insufficient documentation

## 2011-11-19 NOTE — Progress Notes (Signed)
  Subjective:    Patient ID: Suzanne Ellis, female    DOB: January 10, 1986, 26 y.o.   MRN: [id number]  HPI Patient here to discuss  1.  Breast feeding/Post partum issues.:  Has been breast feeding child since birth a couple of months ago.  Feels like child has become too attached to her and she wants to know what to do about it.  She gets frustrated that baby cries "all the time" if she is not breast feeding.  She often wants to "comfort nurse".  She has some help at her house from family.  She is unable to pump because she can not afford a pump and hand pump was too uncomfortable.  She has not thought of hurting herself or child, just wants to know if this is normal.  She feels like her milk supply is good and she has not had any pain with breast feeding.  She has also had increased anxiety about sending child to daycare because she has seen "bad stuff" happen to kids all the time at daycares.       Review of Systems Per HPI    Objective:   Physical Exam  Constitutional: She appears well-developed and well-nourished. No distress.  Neurological: She is alert.  Psychiatric: She has a normal mood and affect. Her behavior is normal.          Assessment & Plan:

## 2011-11-19 NOTE — Assessment & Plan Note (Signed)
Anxiety about baby breastfeeding and being too attached.  I gave her strategies to help with comfort feeding including allowing other family members to help out, rock baby.  Ad[facility]. Given information of breast feeding support classes. Reassured about safety of most daycare centers and to look around until she found one she is comfortable with.

## 2011-12-18 ENCOUNTER — Encounter: Payer: Self-pay | Admitting: Family Medicine

## 2011-12-18 ENCOUNTER — Ambulatory Visit (INDEPENDENT_AMBULATORY_CARE_PROVIDER_SITE_OTHER): Payer: Medicaid Other | Admitting: Family Medicine

## 2011-12-18 VITALS — BP 118/71 | HR 86 | Temp 98.8°F | Ht 65.0 in | Wt 136.0 lb

## 2011-12-18 DIAGNOSIS — Z309 Encounter for contraceptive management, unspecified: Secondary | ICD-10-CM

## 2011-12-18 DIAGNOSIS — R21 Rash and other nonspecific skin eruption: Secondary | ICD-10-CM

## 2011-12-18 DIAGNOSIS — IMO0002 Reserved for concepts with insufficient information to code with codable children: Secondary | ICD-10-CM

## 2011-12-18 DIAGNOSIS — F53 Postpartum depression: Secondary | ICD-10-CM

## 2011-12-18 LAB — POCT URINE PREGNANCY: Preg Test, Ur: NEGATIVE

## 2011-12-18 MED ORDER — TRIAMCINOLONE ACETONIDE 0.5 % EX CREA: TOPICAL_CREAM | Freq: Two times a day (BID) | CUTANEOUS | Status: DC

## 2011-12-18 MED ORDER — MEDROXYPROGESTERONE ACETATE 150 MG/ML IM SUSP
150.0000 mg | Freq: Once | INTRAMUSCULAR | Status: AC
  Administered 2011-12-18: 150 mg via INTRAMUSCULAR

## 2011-12-18 NOTE — Assessment & Plan Note (Signed)
UPT negative.  Given Depo provera today. Return in three months for repeat.

## 2011-12-18 NOTE — Assessment & Plan Note (Addendum)
She has a score of 22 on EDPS and feelings have persisted over the past couple of months.  She bonding well with her child but has increased anxiety over situational things.  I have recommended speaking with a counselor about this and gave her a list of providers that deal with PP depression. Also discussed medication options including zoloft or paxil.  She would like to research these and declines medication at this time.    Breast feeding seems to be a significant stressor for her as she wants to continue but finds it quite time consuming. I discussed weaning strategies and trying different nipple shields to see if that makes hand pump more comfortable.

## 2011-12-18 NOTE — Patient Instructions (Signed)
You may want to try a different type of breast shield for your hand pump to see if that provides more comfort.  How do I wean?  Proceed slowly, regardless of your child's age. Experts say that abruptly withholding your breast can be traumatic for your baby and could cause plugged ducts or a breast infection for you. A weekend away from your baby or toddler, for example, is not a good way to end the breastfeeding relationship.  Try these methods instead:  Skip a feeding. See what happens if you offer a bottle or cup of milk instead of nursing. As a substitute you can give pumped breast milk, formula, or whole cow's milk (if your child is at least a year old). Reducing feedings one at a time over a period of weeks gives your child time to adjust. Your milk supply also diminishes gradually this way, without leaving your breasts engorged or causing mastitis.  Shorten nursing time. Start by limiting the time your child is on the breast. If he usually nurses for ten minutes, try five. Depending on age, follow the feeding with a healthy snack, such as unsweetened applesauce or a cup of milk or formula. (Babies younger than 6 months may not be ready for solids.) Solid food is complementary to breast milk until your baby is a year old.  Bedtime feedings may be harder to shorten because they're usually the last to go.  Postpone and distract. Try postponing feedings if you're only nursing a couple of times a day. This method works well if you have an older child you can reason with. If your child asks to nurse, reassure him that you will soon and distract him with a different activity. Instead of nursing in the early evening, you could tell him to wait until bedtime.  Try the cream on the rash on your arm, if this does not improve  Please return.

## 2011-12-18 NOTE — Assessment & Plan Note (Signed)
Rash appears to be some type of allergic dermatitis.  Unsure of cause.  Will give a trial of Triamcinolone cream.  Instructed to return if worsening.

## 2011-12-18 NOTE — Progress Notes (Signed)
  Subjective:    Patient ID: Suzanne Ellis, female    DOB: 22-Mar-1985, 26 y.o.   MRN: [id number]  HPI 1. Breast feeding/Postpartum issues:  She would like to discuss strategies to wean her baby from breastfeeding.  She enjoys breastfeeding and knows it is best for her baby but finds it too time consuming.  She is unable to afford to rent a breast pump from hospital and [facility] waiting list is too long. She does have a hand pump but finds this too painful to use.  She is sure that she wants to begin weaning.  Other issue is that she still has increased anxiety and thinks she may be suffering from post partum depression.  She is nervous about the wellbeing of her child and finds herself worrying quite often.  She would never do anything to harm her self or her child.    2. Rash on arm:  Has had rash on R arm x4 months.  Unsure of cause.  No changes in detergents, soaps, lotions, perfumes, new foods, jewelry etc.  It is itchy.  Has not noticed any redness or drainage.    3. Contraception management:  Currently on depo provera. Would like to get another dose.  She is however a couple weeks past due date.     Review of Systems Per HPI    Objective:   Physical Exam  Constitutional: She appears well-nourished. No distress.  HENT:  Head: Normocephalic and atraumatic.  Skin:       There is a fine papular rash along her R arm.  Non tender.  Non erythematous.  No drainage.   Psychiatric: She has a normal mood and affect. Her behavior is normal. Judgment and thought content normal.       EPDS with score of 22, score of 0 for question 10           Assessment & Plan:

## 2011-12-31 ENCOUNTER — Encounter: Payer: Self-pay | Admitting: Family Medicine

## 2011-12-31 MED ORDER — SERTRALINE HCL 50 MG PO TABS: 50.0000 mg | ORAL_TABLET | Freq: Every day | ORAL | Status: DC

## 2011-12-31 NOTE — Progress Notes (Signed)
Patient ID: Suzanne Ellis, female   DOB: May 22, 1985, 26 y.o.   MRN: [id number] Here today because she brought her daughter in for [facility].  Would like to start on medication for post partum depression.  Has also contacted counselor to discuss these issues.  Would like to start on zoloft. Will start 50mg  and instructed to follow up in 2-3 weeks.

## 2012-01-03 ENCOUNTER — Telehealth: Payer: Self-pay | Admitting: Family Medicine

## 2012-01-03 NOTE — Telephone Encounter (Signed)
Patient uses [facility] on [address].  Per pharmacist---Zoloft Rx was not received electronically.  Called in new Rx per orders in Epic---Zoloft 50 mg #30--take 1 tab daily x 3 refills.  Patient informed to check with her pharmacy for Rx later today.  Gaylene Brooks, RN

## 2012-01-03 NOTE — Telephone Encounter (Signed)
Returned call to patient to verify correct pharmacy.  Left message to call our office back.  Gaylene Brooks, RN

## 2012-01-03 NOTE — Telephone Encounter (Signed)
Patient is calling because an Rx for Zoloft was to be sent to her pharmacy on Monday, our system show that it was sent, but the pharmacy has not received it.  Patient says she has called to check on it several times since Monday, but no one has called her back and it still isn't at her pharmacy.  She would like the Rx called into her pharmacy instead of being sent electronically.

## 2012-01-28 ENCOUNTER — Ambulatory Visit: Payer: Self-pay | Admitting: Family Medicine

## 2012-03-06 ENCOUNTER — Ambulatory Visit (INDEPENDENT_AMBULATORY_CARE_PROVIDER_SITE_OTHER): Payer: Medicaid Other | Admitting: Family Medicine

## 2012-03-06 VITALS — BP 127/85 | HR 76 | Temp 98.6°F | Ht 65.0 in | Wt 134.0 lb

## 2012-03-06 DIAGNOSIS — Z0000 Encounter for general adult medical examination without abnormal findings: Secondary | ICD-10-CM

## 2012-03-06 DIAGNOSIS — Z309 Encounter for contraceptive management, unspecified: Secondary | ICD-10-CM

## 2012-03-06 MED ORDER — MEDROXYPROGESTERONE ACETATE 150 MG/ML IM SUSP
150.0000 mg | Freq: Once | INTRAMUSCULAR | Status: AC
  Administered 2012-03-06: 150 mg via INTRAMUSCULAR

## 2012-03-13 NOTE — Progress Notes (Signed)
Patient ID: Suzanne Ellis, female   DOB: 1985-08-22, 26 y.o.   MRN: [id number] SUBJECTIVE:  26 y.o. female for annual routine  Checkup.  Reports she is doing well.  She has recently started a new job at [employer] and is very happy.  Current Outpatient Prescriptions  Medication Sig Dispense Refill  . medroxyPROGESTERone (DEPO-PROVERA) 150 MG/ML injection Inject 150 mg into the muscle every 3 (three) months.      . sertraline (ZOLOFT) 50 MG tablet Take 1 tablet (50 mg total) by mouth daily.  30 tablet  3  . triamcinolone cream (KENALOG) 0.5 % Apply topically 2 (two) times daily.  30 g  1   Allergies: Review of patient's allergies indicates no known allergies.  No LMP recorded.  ROS:  Feeling well. No dyspnea or chest pain on exertion.  No abdominal pain, change in bowel habits, black or bloody stools.  No urinary tract symptoms. GYN ROS: no side effects of hormonal medications, no vaginal bleeding, no discharge or pelvic pain. No neurological complaints.  OBJECTIVE:  The patient appears well, alert, oriented x 3, in no distress. BP 127/85  Pulse 76  Temp 98.6 F (37 C)  Ht 5\' 5"  (1.651 m)  Wt 134 lb (60.782 kg)  BMI 22.30 kg/m2 ENT normal.  Neck supple. No adenopathy or thyromegaly. PERLA. Lungs are clear, good air entry, no wheezes, rhonchi or rales. S1 and S2 normal, no murmurs, regular rate and rhythm. Abdomen soft without tenderness, guarding, mass or organomegaly. Extremities show no edema, normal peripheral pulses. Neurological is normal, no focal findings.  ASSESSMENT:  well woman no contraindication to continue depo provera  PLAN:  return annually or prn Pap smear not done as previous paps have been normal. Depo shot today

## 2012-05-21 ENCOUNTER — Ambulatory Visit: Payer: Medicaid Other | Admitting: Family Medicine

## 2012-06-03 ENCOUNTER — Ambulatory Visit: Payer: Medicaid Other | Admitting: Family Medicine

## 2012-06-17 ENCOUNTER — Ambulatory Visit (INDEPENDENT_AMBULATORY_CARE_PROVIDER_SITE_OTHER): Payer: Medicaid Other | Admitting: *Deleted

## 2012-06-17 DIAGNOSIS — Z309 Encounter for contraceptive management, unspecified: Secondary | ICD-10-CM

## 2012-06-17 MED ORDER — MEDROXYPROGESTERONE ACETATE 150 MG/ML IM SUSP
150.0000 mg | Freq: Once | INTRAMUSCULAR | Status: AC
  Administered 2012-06-17: 150 mg via INTRAMUSCULAR

## 2012-06-18 NOTE — Progress Notes (Signed)
Patient here today for Depo Provera.  Last Depo given on 03/06/12 and patient was due to return for next injection on March 6-20, 2014.  Will obtain urine pregnancy test today.  Urine pregnancy---negative.  Patient given Depo and informed to use protection for next 7 days.  Will need repeat pregnancy test in 2 weeks.   Next Depo due June 17-September 16, 2012 and appt card given to patient.  Gaylene Brooks, RN

## 2012-06-30 ENCOUNTER — Encounter: Payer: Medicaid Other | Admitting: Family Medicine

## 2012-07-07 ENCOUNTER — Ambulatory Visit (INDEPENDENT_AMBULATORY_CARE_PROVIDER_SITE_OTHER): Payer: Medicaid Other | Admitting: Family Medicine

## 2012-07-07 VITALS — BP 128/76 | HR 93 | Temp 97.7°F | Resp 18

## 2012-07-07 DIAGNOSIS — R21 Rash and other nonspecific skin eruption: Secondary | ICD-10-CM

## 2012-07-07 DIAGNOSIS — F3289 Other specified depressive episodes: Secondary | ICD-10-CM

## 2012-07-07 DIAGNOSIS — F329 Major depressive disorder, single episode, unspecified: Secondary | ICD-10-CM

## 2012-07-07 MED ORDER — CLOTRIMAZOLE 1 % EX CREA: TOPICAL_CREAM | Freq: Two times a day (BID) | CUTANEOUS | Status: DC

## 2012-07-07 MED ORDER — SERTRALINE HCL 50 MG PO TABS: 50.0000 mg | ORAL_TABLET | Freq: Every day | ORAL | Status: DC

## 2012-07-07 NOTE — Patient Instructions (Addendum)
Try the antifungal cream to see if that is helpful on the hands Follow up in 4-6 weeks to see if this has improved Restart the zoloft and follow up with Dr. Pascal Lux I recommend that you meet with our psychologist, Dr. Spero Geralds, for help dealing with your anxiety/depression.  You can schedule an appointment with her by calling her directly at [phone].

## 2012-07-07 NOTE — Assessment & Plan Note (Signed)
Discussed with Dr. Leveda Anna and Dr. Gwendolyn Grant.  Hypopigmented areas do not seem consistent with steroid induced hypopigmentation.  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  Will give a trial of clotrimazole cream to see if this improves.  Follow up in 4-6 weeks and if not improving consider punch biopsy.

## 2012-07-07 NOTE — Assessment & Plan Note (Addendum)
Complaints of feeling of frequent anxiety, unclear what her previous mood disorder was.  Looking back at Dr. Carola Rhine notes it appears she may have bipolar d/o although I can't find this documented in her problem list previously.  Treated with lamictal in the past.  Will refer back to Dr. Pascal Lux, given number.  Will have her stop zoloft at this time given history of bipolar disorder found on chart review.

## 2012-07-07 NOTE — Progress Notes (Signed)
  Subjective:    Patient ID: Suzanne Ellis, female    DOB: [birth year], 27 y.o.   MRN: [id number]  HPI 1.  Rash:  Here with c/o rash.  Initially started at fine papular rash, but now hypopigmented macules.  Rash is still itchy at times.  Has tried triamcinolone with no improvement.  No erythema, drainage, pain.   2. Anxiety:  C/o of anxious feeling daily.  Most anxious thoughts revolve around her child.  She has trouble leaving her child alone to go to work for fear something may happen to her.  Realizes that thoughts are irrational at times but can't control them.  Has tried sertraline most recently but feels like it wasn't very helpful.  Denies times where she could go periods without sleep and feel fine.  Does have some periods where she feels more energetic and more social than usual.  Denies pressured speech, high risk behavior, SI or HI.  Reports a family history of Bipolar d/o in her mother.     Review of Systems Per HPI    Objective:   Physical Exam  Constitutional: She appears well-nourished. No distress.  HENT:  Head: Atraumatic.  Neurological: She is alert.  Skin:  Smooth Hypopigmented macular areas along arms and dorsum of hands.  No excoriation or scaling.   Psychiatric: She has a normal mood and affect. Her behavior is normal.          Assessment & Plan:

## 2012-07-09 ENCOUNTER — Telehealth: Payer: Self-pay | Admitting: Family Medicine

## 2012-07-09 NOTE — Telephone Encounter (Signed)
Pt's mailbox is full for 2nd day, pt no longer at work #. Will continue to attempt to reach pt.

## 2012-07-11 ENCOUNTER — Telehealth: Payer: Self-pay | Admitting: Family Medicine

## 2012-07-11 NOTE — Telephone Encounter (Signed)
LM w/ emergency contact for pt to rt call.

## 2012-07-18 ENCOUNTER — Ambulatory Visit: Payer: Medicaid Other | Admitting: Family Medicine

## 2012-08-15 ENCOUNTER — Ambulatory Visit: Payer: Medicaid Other | Admitting: Family Medicine

## 2012-09-03 ENCOUNTER — Ambulatory Visit (INDEPENDENT_AMBULATORY_CARE_PROVIDER_SITE_OTHER): Payer: Medicaid Other | Admitting: *Deleted

## 2012-09-03 DIAGNOSIS — Z309 Encounter for contraceptive management, unspecified: Secondary | ICD-10-CM

## 2012-09-03 MED ORDER — MEDROXYPROGESTERONE ACETATE 150 MG/ML IM SUSP
150.0000 mg | Freq: Once | INTRAMUSCULAR | Status: AC
  Administered 2012-09-03: 150 mg via INTRAMUSCULAR

## 2012-09-03 NOTE — Progress Notes (Signed)
Pt here for depo. Depo given RUOQ. Next depo due sept 1 -17 - reminder card given. Elizabeth Mireyah Chervenak, RN-BSN    

## 2012-09-15 ENCOUNTER — Ambulatory Visit: Payer: Medicaid Other | Admitting: Family Medicine

## 2012-10-27 ENCOUNTER — Ambulatory Visit: Payer: Medicaid Other | Admitting: Emergency Medicine

## 2012-12-02 ENCOUNTER — Ambulatory Visit: Payer: Medicaid Other | Admitting: *Deleted

## 2012-12-12 ENCOUNTER — Encounter: Payer: Medicaid Other | Admitting: Emergency Medicine

## 2012-12-19 ENCOUNTER — Encounter: Payer: Medicaid Other | Admitting: Emergency Medicine

## 2013-01-19 ENCOUNTER — Encounter: Payer: Self-pay | Admitting: Emergency Medicine

## 2013-01-19 ENCOUNTER — Ambulatory Visit (INDEPENDENT_AMBULATORY_CARE_PROVIDER_SITE_OTHER): Payer: Medicaid Other | Admitting: Emergency Medicine

## 2013-01-19 VITALS — BP 126/70 | Temp 99.7°F | Ht 65.0 in | Wt 146.0 lb

## 2013-01-19 DIAGNOSIS — Z309 Encounter for contraceptive management, unspecified: Secondary | ICD-10-CM

## 2013-01-19 DIAGNOSIS — F319 Bipolar disorder, unspecified: Secondary | ICD-10-CM | POA: Insufficient documentation

## 2013-01-19 MED ORDER — DIVALPROEX SODIUM ER 500 MG PO TB24: 500.0000 mg | ORAL_TABLET | Freq: Every day | ORAL | Status: DC

## 2013-01-19 NOTE — Progress Notes (Signed)
  Subjective:    Patient ID: Suzanne Ellis, female    DOB: 12-04-1985, 27 y.o.   MRN: [id number]  HPI Suzanne Ellis is here for a physical exam and to discuss mood issues.  I have reviewed and updated the following as appropriate: allergies, current medications, past family history, past medical history, past social history and past surgical history PMHx: h/o bipolar  FHx: hypertension and diabetes SHx: current smoker  Mood issues She reports a long history of having trouble with her moods.  She describes episodes of depression (trouble sleeping, concentrating, depressed mood, eating) as well as times where she is very productive and expansive and spends money impulsively.  She reports highs and lows every month.  She denies any SI/HI.  She states she has been on multiple medications since her teen years, including paxil, zoloft and lamictal.  She states that none of them every really helped and that lamictal gave her a rash.  She has tried therapy in the past, but has never been able to stick with it long enough for it to work.  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  Review of Systems Patient Information Form: Screening and ROS  Do you feel safe in relationships? yes PHQ-2:positive  Review of Symptoms  General:  Negative for nexplained weight loss, fever Skin: Negative for new or changing mole, sore that won't heal HEENT: Negative for trouble hearing, trouble seeing, ringing in ears, mouth sores, hoarseness, change in voice, dysphagia. CV:  Negative for chest pain, dyspnea, edema, palpitations Resp: Negative for cough, dyspnea, hemoptysis GI: Negative for nausea, vomiting, diarrhea, constipation, abdominal pain, melena,  hematochezia. GU: Negative for dysuria, incontinence, urinary hesitance, hematuria, vaginal or penile discharge, polyuria, sexual difficulty, lumps in testicle or breasts MSK: Negative for muscle cramps or aches, joint pain or swelling Neuro: Negative for +headaches, weakness, numbness, dizziness, passing out/fainting Psych: Negative for +depression, anxiety, memory problems      Objective:   Physical Exam BP 126/70  Temp(Src) 99.7 F (37.6 C) (Oral)  Ht 5\' 5"  (1.651 m)  Wt 146 lb (66.225 kg)  BMI 24.30 kg/m2 Gen: alert, cooperative, NAD HEENT: AT/[place], sclera white, MMM Neck: supple CV: RRR, no murmurs Pulm: CTAB, no wheezes or rales Ext: no edema, 2+ DP pulses bilaterally Psych: well groomed; normal thought process; does appear anxious and has trouble sitting still     Assessment & Plan:  I spent 40 minutes with the patient, > 50% spent in counseling the patient.

## 2013-01-19 NOTE — Patient Instructions (Signed)
It was nice to see you!  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  I will call you with the results.  If everything looks good, we will start the medicine depakote.  You will take 1 pill once a day.  Follow up in 2 weeks.

## 2013-01-19 NOTE — Assessment & Plan Note (Signed)
On depo.  Reports good compliance.

## 2013-01-19 NOTE — Assessment & Plan Note (Addendum)
History consisted with bipolar II with rapid cycling.  May have anxiety component as well. PHQ-9: 15; GAD: 16 No SI/HI. Will check labwork (CBC, CMP, TSH).   If labs okay, will start depakote ER 500mg  daily. Discussed extensively that this will take time to find the right medication and dose. Follow up in 2 weeks, likely with increased dose at that time.

## 2013-02-16 ENCOUNTER — Telehealth: Payer: Self-pay | Admitting: Family Medicine

## 2013-02-16 NOTE — Telephone Encounter (Signed)
Entered in error

## 2013-02-20 ENCOUNTER — Ambulatory Visit (INDEPENDENT_AMBULATORY_CARE_PROVIDER_SITE_OTHER): Payer: Medicaid Other | Admitting: *Deleted

## 2013-02-20 DIAGNOSIS — Z309 Encounter for contraceptive management, unspecified: Secondary | ICD-10-CM

## 2013-02-20 LAB — POCT URINE PREGNANCY: Preg Test, Ur: NEGATIVE

## 2013-02-20 MED ORDER — MEDROXYPROGESTERONE ACETATE 150 MG/ML IM SUSP
150.0000 mg | Freq: Once | INTRAMUSCULAR | Status: AC
  Administered 2013-02-20: 150 mg via INTRAMUSCULAR

## 2013-02-20 NOTE — Progress Notes (Signed)
Patient in today for depo. Last documented injection was 09/03/12, although patient insistent that she received an injection in September. Urine pregnancy was obtained with negative results. Injection given in right ventrogluteal, patient without complaints, site unremarkable. Next depo due February 20 - March 3, patient aware.

## 2013-04-16 ENCOUNTER — Other Ambulatory Visit: Payer: Medicaid Other

## 2013-04-16 DIAGNOSIS — F319 Bipolar disorder, unspecified: Secondary | ICD-10-CM

## 2013-04-16 LAB — COMPREHENSIVE METABOLIC PANEL
ALT: 8 U/L (ref 0–35)
AST: 11 U/L (ref 0–37)
Albumin: 4.5 g/dL (ref 3.5–5.2)
Alkaline Phosphatase: 48 U/L (ref 39–117)
BUN: 12 mg/dL (ref 6–23)
CO2: 26 meq/L (ref 19–32)
CREATININE: 0.7 mg/dL (ref 0.50–1.10)
Calcium: 9.2 mg/dL (ref 8.4–10.5)
Chloride: 107 mEq/L (ref 96–112)
Glucose, Bld: 91 mg/dL (ref 70–99)
Potassium: 4.1 mEq/L (ref 3.5–5.3)
Sodium: 139 mEq/L (ref 135–145)
Total Bilirubin: 0.5 mg/dL (ref 0.2–1.2)
Total Protein: 7.2 g/dL (ref 6.0–8.3)

## 2013-04-16 LAB — CBC
HCT: 39.9 % (ref 36.0–46.0)
Hemoglobin: 13.1 g/dL (ref 12.0–15.0)
MCH: 27.2 pg (ref 26.0–34.0)
MCHC: 32.8 g/dL (ref 30.0–36.0)
MCV: 82.8 fL (ref 78.0–100.0)
Platelets: 183 10*3/uL (ref 150–400)
RBC: 4.82 MIL/uL (ref 3.87–5.11)
RDW: 13.3 % (ref 11.5–15.5)
WBC: 7.7 10*3/uL (ref 4.0–10.5)

## 2013-04-16 LAB — TSH: TSH: 0.574 u[IU]/mL (ref 0.350–4.500)

## 2013-04-16 NOTE — Progress Notes (Signed)
CMP,CBC AND TSH DONE TODAY Suzanne Ellis 

## 2013-04-22 ENCOUNTER — Telehealth: Payer: Self-pay | Admitting: Emergency Medicine

## 2013-04-22 DIAGNOSIS — F319 Bipolar disorder, unspecified: Secondary | ICD-10-CM

## 2013-04-22 MED ORDER — DIVALPROEX SODIUM ER 500 MG PO TB24: 500.0000 mg | ORAL_TABLET | Freq: Every day | ORAL | Status: DC

## 2013-04-22 NOTE — Telephone Encounter (Signed)
Called and spoke with patient regarding lab results and bipolar medication.  Labs are normal.  After discussion, will start depakote ER 500mg  daily, increase to 1000mg  after 1 week.  She will schedule a f/u with me in 1 month to assess her function.  She will need a CBC, CMP, and valproic acid level at that time.

## 2013-04-22 NOTE — Telephone Encounter (Signed)
Patient calls wanting lab results from last week, also would like to know if Dr. Piedad ClimesHonig will be sending a RX for lithium. Please call patient at [phone]

## 2013-05-22 ENCOUNTER — Other Ambulatory Visit (HOSPITAL_COMMUNITY)
Admission: RE | Admit: 2013-05-22 | Discharge: 2013-05-22 | Disposition: A | Discharge status: Home / Self Care | Payer: Medicaid Other | Source: Ambulatory Visit | Attending: Family Medicine | Admitting: Family Medicine

## 2013-05-22 ENCOUNTER — Ambulatory Visit (INDEPENDENT_AMBULATORY_CARE_PROVIDER_SITE_OTHER): Payer: Medicaid Other | Admitting: Family Medicine

## 2013-05-22 ENCOUNTER — Encounter: Payer: Self-pay | Admitting: Family Medicine

## 2013-05-22 VITALS — BP 130/78 | HR 88 | Temp 99.7°F | Ht 65.0 in | Wt 152.0 lb

## 2013-05-22 DIAGNOSIS — F319 Bipolar disorder, unspecified: Secondary | ICD-10-CM

## 2013-05-22 DIAGNOSIS — Z113 Encounter for screening for infections with a predominantly sexual mode of transmission: Secondary | ICD-10-CM | POA: Insufficient documentation

## 2013-05-22 DIAGNOSIS — A499 Bacterial infection, unspecified: Secondary | ICD-10-CM

## 2013-05-22 DIAGNOSIS — B9689 Other specified bacterial agents as the cause of diseases classified elsewhere: Secondary | ICD-10-CM | POA: Insufficient documentation

## 2013-05-22 DIAGNOSIS — R109 Unspecified abdominal pain: Secondary | ICD-10-CM

## 2013-05-22 DIAGNOSIS — N898 Other specified noninflammatory disorders of vagina: Secondary | ICD-10-CM

## 2013-05-22 DIAGNOSIS — Z309 Encounter for contraceptive management, unspecified: Secondary | ICD-10-CM

## 2013-05-22 DIAGNOSIS — N760 Acute vaginitis: Secondary | ICD-10-CM

## 2013-05-22 LAB — POCT WET PREP (WET MOUNT): Clue Cells Wet Prep Whiff POC: POSITIVE

## 2013-05-22 MED ORDER — MEDROXYPROGESTERONE ACETATE 150 MG/ML IM SUSP
150.0000 mg | Freq: Once | INTRAMUSCULAR | Status: AC
Start: 2013-05-22 — End: 2013-05-22
  Administered 2013-05-22: 150 mg via INTRAMUSCULAR

## 2013-05-22 MED ORDER — METRONIDAZOLE 500 MG PO TABS: 500.0000 mg | ORAL_TABLET | Freq: Two times a day (BID) | ORAL | Status: DC

## 2013-05-22 NOTE — Patient Instructions (Signed)
Vaginal Discharge - likely from bacterial vaginosis, Dr. Randolm IdolFletke will call you with the results of your other tests.  Abdominal Pain  - if continues follow up with your primary physician.

## 2013-05-22 NOTE — Assessment & Plan Note (Signed)
Wet mount identified bacterial vaginosis. -Patient prescribed Flagyl 500 mg twice daily for 7 days

## 2013-05-22 NOTE — Assessment & Plan Note (Signed)
Patient has past medical history of bipolar disorder. She discontinued Depakote 2 weeks ago. Patient is not acutely manic. -Patient was counseled to followup with her PCP for further discussion on need for Depakote

## 2013-05-22 NOTE — Progress Notes (Signed)
   Subjective:    Patient ID: Suzanne Ellis, female    DOB: 09/02/1985, 28 y.o.   MRN: [id number]  HPI 28 year old female presents for evaluation of malodorous vaginal discharge, symptoms at the present for the past month and a half, patient reports a history of gonorrhea, Chlamydia, Trichomonas, nature vaginosis, and pelvic inflammatory disease. Patient states that her last sexual intercourse occurred approximately 6 months ago, she reports right lower quadrant abdominal pain that has been chronic in nature, she has undergone previous vaginal ultrasounds which identified ovarian cyst per her account, patient is currently on Depo-Medrol, she is due for her next injection, patient is unsure of when her last period was as she is regular periods from the Depo-Medrol, patient is a G4 P3 013, patient wishes to be screened for HIV, syphilis, and hepatitis.   Review of Systems  Constitutional: Negative for fever, chills and fatigue.  Respiratory: Negative for shortness of breath.   Cardiovascular: Negative for chest pain.  Gastrointestinal: Positive for abdominal pain. Negative for nausea, vomiting, diarrhea and abdominal distention.       Objective:   Physical Exam Vitals: Reviewed General: Pleasant female, no acute distress Cardiac: Regular in rhythm, S1 and S2 present, no murmurs, no heaves or thrills Respiratory: Clear to auscultation bilaterally, normal effort Abdomen: Soft, right lower quadrant tenderness, normal bowel sounds Genitourinary: Normal external female anatomy, no external skin lesions noted, moderate amount of white discharge noted, speculum examination identified normal appearing cervix with copious white discharge, cultures were obtained, bimanual examination was performed which identified normal female anatomy without mass, patient did have tenderness in the right lower quadrant       Assessment & Plan:  Please see problem specific assessment and plan.

## 2013-05-22 NOTE — Assessment & Plan Note (Signed)
Patient reports one half month history of right lower corner abdominal and pelvic pain. Likely due to bacterial vaginosis infection. Cultures obtained to rule out gonorrhea and Chlamydia. -If symptoms persist despite treatment for bacterial vaginosis could consider followup with patient's PCP

## 2013-05-22 NOTE — Assessment & Plan Note (Signed)
Wet mount identified bacterial vaginosis. -Patient treated with Flagyl 500 mg twice daily for 7 days -Cultures obtained for gonorrhea, Chlamydia. -Blood work obtained to screen for HIV, syphilis, and hepatitis.

## 2013-05-23 LAB — HEPATITIS PANEL, ACUTE
HCV AB: NEGATIVE
HEP A IGM: NONREACTIVE
Hep B C IgM: NONREACTIVE
Hepatitis B Surface Ag: NEGATIVE

## 2013-05-23 LAB — RPR

## 2013-05-23 LAB — HIV ANTIBODY (ROUTINE TESTING W REFLEX): HIV: NONREACTIVE

## 2013-05-25 LAB — CERVICOVAGINAL ANCILLARY ONLY
Chlamydia: NEGATIVE
Neisseria Gonorrhea: NEGATIVE

## 2013-05-27 ENCOUNTER — Telehealth: Payer: Self-pay | Admitting: Family Medicine

## 2013-05-27 NOTE — Telephone Encounter (Signed)
Discussed negative lab results with patient. She reports abdominal pain/cramping with urination, no dysuria, no fevers/chill. Informed patient to complete course of Flagyl to treat BV, return to office if symptoms persist.

## 2013-06-11 ENCOUNTER — Ambulatory Visit: Payer: Medicaid Other | Admitting: Emergency Medicine

## 2013-06-17 ENCOUNTER — Encounter: Payer: Self-pay | Admitting: Emergency Medicine

## 2013-06-17 ENCOUNTER — Ambulatory Visit (INDEPENDENT_AMBULATORY_CARE_PROVIDER_SITE_OTHER): Payer: Medicaid Other | Admitting: Emergency Medicine

## 2013-06-17 VITALS — BP 117/71 | HR 89 | Temp 98.8°F | Ht 65.0 in | Wt 154.0 lb

## 2013-06-17 DIAGNOSIS — F319 Bipolar disorder, unspecified: Secondary | ICD-10-CM

## 2013-06-17 MED ORDER — OLANZAPINE-FLUOXETINE HCL 6-25 MG PO CAPS: 1.0000 | ORAL_CAPSULE | Freq: Every day | ORAL | Status: DC

## 2013-06-17 NOTE — Patient Instructions (Signed)
It was nice to see you!  We are stopping the Depakote.  Start Symbyax 1 pill at bedtime.  Follow up with me in 2-4 weeks.

## 2013-06-17 NOTE — Assessment & Plan Note (Signed)
Stop depakote due to side effects. Will start symbyax at 6/25mg . Reviewed side effects. F/u in 2-4 weeks.

## 2013-06-17 NOTE — Progress Notes (Signed)
   Subjective:    Patient ID: Suzanne Ellis, female    DOB: 04/20/1985, 28 y.o.   MRN: [id number]  HPI Suzanne Ellis is here for f/u medication.  She states that the Depakote made her very sleepy and unable to function at home or work.  This was true at the 500mg  dose.  States did not get any improvement in mood either.    Current Outpatient Prescriptions on File Prior to Visit  Medication Sig Dispense Refill  . medroxyPROGESTERone (DEPO-PROVERA) 150 MG/ML injection Inject 150 mg into the muscle every 3 (three) months.       No current facility-administered medications on file prior to visit.    I have reviewed and updated the following as appropriate: allergies and current medications SHx: current smoker  Health Maintenance: pap due 02/2014  Review of Systems See HPI    Objective:   Physical Exam BP 117/71  Pulse 89  Temp(Src) 98.8 F (37.1 C) (Oral)  Ht 5\' 5"  (1.651 m)  Wt 154 lb (69.854 kg)  BMI 25.63 kg/m2  Breastfeeding? No Gen: alert, cooperative, NAD Psych: appears a little anxious, hard to sit still; behavior appropriate     Assessment & Plan:

## 2013-07-03 ENCOUNTER — Telehealth: Payer: Self-pay | Admitting: *Deleted

## 2013-07-03 NOTE — Telephone Encounter (Signed)
Will fwd to PCP for review. Thanks. .Suzanne Ellis  

## 2013-07-03 NOTE — Telephone Encounter (Signed)
[facility] 6 mg/25 mg capsules. Item discontinued through manufacturer. Any alternative. Please call [phone] or Fax new Rx to  [phone]. Clovis Puamika L Shay Bartoli, RN

## 2013-07-07 MED ORDER — FLUOXETINE HCL 20 MG PO TABS: 20.0000 mg | ORAL_TABLET | Freq: Every day | ORAL | Status: DC

## 2013-07-07 MED ORDER — OLANZAPINE 5 MG PO TABS: 5.0000 mg | ORAL_TABLET | Freq: Every day | ORAL | Status: DC

## 2013-07-07 NOTE — Telephone Encounter (Signed)
I have sent in prescriptions for the individual components of Symbyax.

## 2013-07-16 ENCOUNTER — Ambulatory Visit: Payer: Medicaid Other | Admitting: Emergency Medicine

## 2013-09-02 ENCOUNTER — Encounter: Payer: Self-pay | Admitting: *Deleted

## 2013-09-02 ENCOUNTER — Ambulatory Visit (INDEPENDENT_AMBULATORY_CARE_PROVIDER_SITE_OTHER): Payer: Medicaid Other | Admitting: *Deleted

## 2013-09-02 DIAGNOSIS — Z309 Encounter for contraceptive management, unspecified: Secondary | ICD-10-CM

## 2013-09-02 LAB — POCT URINE PREGNANCY: Preg Test, Ur: NEGATIVE

## 2013-09-02 MED ORDER — MEDROXYPROGESTERONE ACETATE 150 MG/ML IM SUSP
150.0000 mg | Freq: Once | INTRAMUSCULAR | Status: AC
  Administered 2013-09-02: 150 mg via INTRAMUSCULAR

## 2013-09-02 NOTE — Progress Notes (Signed)
   Pt late for Depo Provera injection.  Pregnancy test ordered; results negative. Pt tolerated Depo injection. Depo given Right upper outer quadrant.  Next injection due Sept 2 -Sept 16, 2015.  Reminder card given. Clovis PuMartin, Tamika L, RN

## 2013-10-02 ENCOUNTER — Encounter: Payer: Self-pay | Admitting: Family Medicine

## 2013-10-02 ENCOUNTER — Other Ambulatory Visit (HOSPITAL_COMMUNITY)
Admission: RE | Admit: 2013-10-02 | Discharge: 2013-10-02 | Disposition: A | Discharge status: Home / Self Care | Payer: Medicaid Other | Source: Ambulatory Visit | Attending: Family Medicine | Admitting: Family Medicine

## 2013-10-02 ENCOUNTER — Ambulatory Visit (INDEPENDENT_AMBULATORY_CARE_PROVIDER_SITE_OTHER): Payer: Medicaid Other | Admitting: Family Medicine

## 2013-10-02 VITALS — BP 112/73 | HR 101 | Temp 98.8°F | Ht 65.0 in | Wt 152.2 lb

## 2013-10-02 DIAGNOSIS — N898 Other specified noninflammatory disorders of vagina: Secondary | ICD-10-CM

## 2013-10-02 DIAGNOSIS — Z01419 Encounter for gynecological examination (general) (routine) without abnormal findings: Secondary | ICD-10-CM | POA: Diagnosis not present

## 2013-10-02 DIAGNOSIS — N390 Urinary tract infection, site not specified: Secondary | ICD-10-CM | POA: Insufficient documentation

## 2013-10-02 DIAGNOSIS — Z113 Encounter for screening for infections with a predominantly sexual mode of transmission: Secondary | ICD-10-CM | POA: Diagnosis present

## 2013-10-02 DIAGNOSIS — R5383 Other fatigue: Secondary | ICD-10-CM

## 2013-10-02 DIAGNOSIS — R5381 Other malaise: Secondary | ICD-10-CM

## 2013-10-02 DIAGNOSIS — Z0000 Encounter for general adult medical examination without abnormal findings: Secondary | ICD-10-CM

## 2013-10-02 DIAGNOSIS — N3000 Acute cystitis without hematuria: Secondary | ICD-10-CM

## 2013-10-02 DIAGNOSIS — Z711 Person with feared health complaint in whom no diagnosis is made: Secondary | ICD-10-CM

## 2013-10-02 LAB — CBC WITH DIFFERENTIAL/PLATELET
Basophils Absolute: 0 10*3/uL (ref 0.0–0.1)
Basophils Relative: 0 % (ref 0–1)
Eosinophils Absolute: 0.1 10*3/uL (ref 0.0–0.7)
Eosinophils Relative: 1 % (ref 0–5)
HCT: 38.6 % (ref 36.0–46.0)
HEMOGLOBIN: 13 g/dL (ref 12.0–15.0)
LYMPHS ABS: 2.5 10*3/uL (ref 0.7–4.0)
Lymphocytes Relative: 26 % (ref 12–46)
MCH: 27.8 pg (ref 26.0–34.0)
MCHC: 33.7 g/dL (ref 30.0–36.0)
MCV: 82.7 fL (ref 78.0–100.0)
MONOS PCT: 5 % (ref 3–12)
Monocytes Absolute: 0.5 10*3/uL (ref 0.1–1.0)
NEUTROS PCT: 68 % (ref 43–77)
Neutro Abs: 6.5 10*3/uL (ref 1.7–7.7)
Platelets: 189 10*3/uL (ref 150–400)
RBC: 4.67 MIL/uL (ref 3.87–5.11)
RDW: 13.8 % (ref 11.5–15.5)
WBC: 9.5 10*3/uL (ref 4.0–10.5)

## 2013-10-02 LAB — POCT URINALYSIS DIPSTICK
BILIRUBIN UA: NEGATIVE
Glucose, UA: NEGATIVE
KETONES UA: NEGATIVE
Nitrite, UA: POSITIVE
PH UA: 6
RBC UA: NEGATIVE
SPEC GRAV UA: 1.025
Urobilinogen, UA: 0.2

## 2013-10-02 LAB — POCT UA - MICROSCOPIC ONLY

## 2013-10-02 LAB — POCT WET PREP (WET MOUNT): Clue Cells Wet Prep Whiff POC: NEGATIVE

## 2013-10-02 LAB — TSH: TSH: 0.529 u[IU]/mL (ref 0.350–4.500)

## 2013-10-02 MED ORDER — METRONIDAZOLE 500 MG PO TABS: 500.0000 mg | ORAL_TABLET | Freq: Two times a day (BID) | ORAL | Status: DC

## 2013-10-02 MED ORDER — CEPHALEXIN 500 MG PO CAPS: 500.0000 mg | ORAL_CAPSULE | Freq: Three times a day (TID) | ORAL | Status: DC

## 2013-10-02 NOTE — Assessment & Plan Note (Signed)
UTI in sexually active female, likely Escherichia coli Treat with Keflex We'll culture if symptoms recur or don't improve.

## 2013-10-02 NOTE — Progress Notes (Signed)
Patient ID: Suzanne Ellis, female   DOB: 06/12/1985, 28 y.o.   MRN: [id number]  Suzanne FentonSamuel [name], MD Phone: [phone]  Subjective:  Chief complaint-noted  Pt Here for concern for STI and fatigue  Fatigue States that it's been over the last 2 months or so. She feels very lazy compared to usual. She denies starting the recently prescribed medicine olanzapine or fluoxetine because of cost. She denies any obvious blood loss were previous thyroid issues. She denies hair loss  STI concerns Patient recently broke up with her boyfriend because of infidelity, she has had only 1 partner in 5 years and would like to get checked for all the STDs she can.  She notes foul-smelling thin white frothy vaginal discharge, no vaginal irritation She has chronic right lower quadrant/right pelvic pain and a history of PID, gonorrhea, Chlamydia, trichomonas, and bacterial vaginosis. This pain is chronic and largely unchanged. She describes that it's annoying tolerable pain it only bothers her intermittently.  She denies fever, chills, sweats Positive malaise and fatigue  Her last period was about 3 years ago before she had a baby. She's been on the Depo shot ever since.  ROS- Per history of present illness  Past Medical History Patient Active Problem List   Diagnosis Date Noted  . Concern about sexually transmitted disease in female without diagnosis 10/02/2013  . Other malaise and fatigue 10/02/2013  . UTI (urinary tract infection) 10/02/2013  . Vaginal discharge 05/22/2013  . BV (bacterial vaginosis) 05/22/2013  . Bipolar disorder, unspecified 01/19/2013  . Rash and nonspecific skin eruption 07/07/2012  . Contraception management 12/18/2011  . MIGRAINE WITHOUT AURA 09/14/2009  . PTSD 08/18/2009  . PELVIC PAIN, CHRONIC 01/06/2009  . TOBACCO DEPENDENCE 05/16/2006  . DEPRESSIVE DISORDER, NOS 05/16/2006    Medications- reviewed and updated Current Outpatient Prescriptions  Medication Sig  Dispense Refill  . cephALEXin (KEFLEX) 500 MG capsule Take 1 capsule (500 mg total) by mouth 3 (three) times daily.  21 capsule  0  . FLUoxetine (PROZAC) 20 MG tablet Take 1 tablet (20 mg total) by mouth daily.  30 tablet  3  . medroxyPROGESTERone (DEPO-PROVERA) 150 MG/ML injection Inject 150 mg into the muscle every 3 (three) months.      . metroNIDAZOLE (FLAGYL) 500 MG tablet Take 1 tablet (500 mg total) by mouth 2 (two) times daily. Avoid alcohol while taking  14 tablet  0  . OLANZapine (ZYPREXA) 5 MG tablet Take 1 tablet (5 mg total) by mouth at bedtime.  30 tablet  3   No current facility-administered medications for this visit.    Objective: BP 112/73  Pulse 101  Temp(Src) 98.8 F (37.1 C) (Oral)  Ht 5\' 5"  (1.651 m)  Wt 152 lb 3.2 oz (69.037 kg)  BMI 25.33 kg/m2 Gen: NAD, alert, cooperative with exam HEENT: NCAT CV: RRR, good S1/S2, no murmur Resp: CTABL, no wheezes, non-labored Abd: Soft, slight tenderness to palpation in far right lower quadrant and right upper quadrant, no Murphy sign Ext: No edema, warm Neuro: Alert and oriented, No gross deficits GU: No apparent lesions on the cranium, vaginal walls well related and pink, cervix normal in appearance with minimal thick white discharge, no CMT or adnexal fullness.   Assessment/Plan:  Other malaise and fatigue Generalized malaise and fatigue She did not start olanzapine or fluoxetine after her last visit. No other signs of thyroid dysfunction Consider mood disorder to be most likely etiology TSH and CBC today Followup PCP in 3-4 weeks  Concern  about sexually transmitted disease in female without diagnosis Patient is concerned as her boyfriend she did her Check GC C., RPR, HIV, hepatitis panel No CMT and appears well so no PID Pap is due in December so and ended that today Wet prep with clues, Tx with flagyl  Followup as needed with her PCP, continue Depo injections  UTI (urinary tract infection) UTI in sexually  active female, likely Escherichia coli Treat with Keflex We'll culture if symptoms recur or don't improve.    Orders Placed This Encounter  Procedures  . TSH  . CBC with Differential  . HIV antibody (with reflex)  . RPR  . Acute Hep Panel & Hep B Surface Ab  . Urinalysis Dipstick  . POCT UA - Microscopic Only

## 2013-10-02 NOTE — Assessment & Plan Note (Signed)
Generalized malaise and fatigue She did not start olanzapine or fluoxetine after her last visit. No other signs of thyroid dysfunction Consider mood disorder to be most likely etiology TSH and CBC today Followup PCP in 3-4 weeks

## 2013-10-02 NOTE — Assessment & Plan Note (Addendum)
Patient is concerned as her boyfriend she did her Check GC C., RPR, HIV, hepatitis panel No CMT and appears well so no PID Pap is due in December so and ended that today Wet prep with clues, Tx with flagyl  Followup as needed with her PCP, continue Depo injections

## 2013-10-02 NOTE — Patient Instructions (Addendum)
Great to meet you!  We'll let you know about your results.   Make an appt with your PCP in the next 3-4 weeks.   You also have bacterial vaginosis, take flagyl for this.   Sexually Transmitted Disease A sexually transmitted disease (STD) is a disease or infection that may be passed (transmitted) from person to person, usually during sexual activity. This may happen by way of saliva, semen, blood, vaginal mucus, or urine. Common STDs include:   Gonorrhea.   Chlamydia.   Syphilis.   HIV and AIDS.   Genital herpes.   Hepatitis B and C.   Trichomonas.   Human papillomavirus (HPV).   Pubic lice.   Scabies.  Mites.  Bacterial vaginosis. WHAT ARE CAUSES OF STDs? An STD may be caused by bacteria, a virus, or parasites. STDs are often transmitted during sexual activity if one person is infected. However, they may also be transmitted through nonsexual means. STDs may be transmitted after:   Sexual intercourse with an infected person.   Sharing sex toys with an infected person.   Sharing needles with an infected person or using unclean piercing or tattoo needles.  Having intimate contact with the genitals, mouth, or rectal areas of an infected person.   Exposure to infected fluids during birth. WHAT ARE THE SIGNS AND SYMPTOMS OF STDs? Different STDs have different symptoms. Some people may not have any symptoms. If symptoms are present, they may include:   Painful or bloody urination.   Pain in the pelvis, abdomen, vagina, anus, throat, or eyes.   A skin rash, itching, or irritation.  Growths, ulcerations, blisters, or sores in the genital and anal areas.  Abnormal vaginal discharge with or without bad odor.   Penile discharge in men.   Fever.   Pain or bleeding during sexual intercourse.   Swollen glands in the groin area.   Yellow skin and eyes (jaundice). This is seen with hepatitis.   Swollen testicles.  Infertility.  Sores and  blisters in the mouth. HOW ARE STDs DIAGNOSED? To make a diagnosis, your health care provider may:   Take a medical history.   Perform a physical exam.   Take a sample of any discharge to examine.  Swab the throat, cervix, opening to the penis, rectum, or vagina for testing.  Test a sample of your first morning urine.   Perform blood tests.   Perform a Pap test, if this applies.   Perform a colposcopy.   Perform a laparoscopy.  HOW ARE STDs TREATED? Treatment depends on the STD. Some STDs may be treated but not cured.   Chlamydia, gonorrhea, trichomonas, and syphilis can be cured with antibiotic medicine.   Genital herpes, hepatitis, and HIV can be treated, but not cured, with prescribed medicines. The medicines lessen symptoms.   Genital warts from HPV can be treated with medicine or by freezing, burning (electrocautery), or surgery. Warts may come back.   HPV cannot be cured with medicine or surgery. However, abnormal areas may be removed from the cervix, vagina, or vulva.   If your diagnosis is confirmed, your recent sexual partners need treatment. This is true even if they are symptom-free or have a negative culture or evaluation. They should not have sex until their health care providers say it is okay. HOW CAN I REDUCE MY RISK OF GETTING AN STD? Take these steps to reduce your risk of getting an STD:  Use latex condoms, dental dams, and water-soluble lubricants during sexual activity. Do not  use petroleum jelly or oils.  Avoid having multiple sex partners.  Do not have sex with someone who has other sex partners.  Do not have sex with anyone you do not know or who is at high risk for an STD.  Avoid risky sex practices that can break your skin.  Do not have sex if you have open sores on your mouth or skin.  Avoid drinking too much alcohol or taking illegal drugs. Alcohol and drugs can affect your judgment and put you in a vulnerable position.  Avoid  engaging in oral and anal sex acts.  Get vaccinated for HPV and hepatitis. If you have not received these vaccines in the past, talk to your health care provider about whether one or both might be right for you.   If you are at risk of being infected with HIV, it is recommended that you take a prescription medicine daily to prevent HIV infection. This is called pre-exposure prophylaxis (PrEP). You are considered at risk if:  You are a man who has sex with other men (MSM).  You are a heterosexual man or woman and are sexually active with more than one partner.  You take drugs by injection.  You are sexually active with a partner who has HIV.  Talk with your health care provider about whether you are at high risk of being infected with HIV. If you choose to begin PrEP, you should first be tested for HIV. You should then be tested every 3 months for as long as you are taking PrEP.  WHAT SHOULD I DO IF I THINK I HAVE AN STD?  See your health care provider.   Tell your sexual partner(s). They should be tested and treated for any STDs.  Do not have sex until your health care provider says it is okay. WHEN SHOULD I GET IMMEDIATE MEDICAL CARE? Contact your health care provider right away if:   You have severe abdominal pain.  You are a man and notice swelling or pain in your testicles.  You are a woman and notice swelling or pain in your vagina. Document Released: 05/26/2002 Document Revised: 03/10/2013 Document Reviewed: 09/23/2012 [facility] Patient Information 2015 [employer] [place]. This information is not intended to replace advice given to you by your health care provider. Make sure you discuss any questions you have with your health care provider.

## 2013-10-03 LAB — ACUTE HEP PANEL AND HEP B SURFACE AB
HCV Ab: NEGATIVE
HEP B S AG: NEGATIVE
Hep A IgM: NONREACTIVE
Hep B C IgM: NONREACTIVE
Hep B S Ab: POSITIVE — AB

## 2013-10-03 LAB — RPR

## 2013-10-03 LAB — HIV ANTIBODY (ROUTINE TESTING W REFLEX): HIV: NONREACTIVE

## 2013-10-06 ENCOUNTER — Telehealth: Payer: Self-pay | Admitting: Family Medicine

## 2013-10-06 LAB — CYTOLOGY - PAP

## 2013-10-06 NOTE — Telephone Encounter (Signed)
Spoke with patient and informed her of below 

## 2013-10-06 NOTE — Telephone Encounter (Signed)
Recent labs normal. Will ask staff to inform.   Murtis SinkSam Bradshaw, MD [facility] Resident, PGY-3 10/06/2013, 12:06 PM

## 2013-10-07 ENCOUNTER — Telehealth: Payer: Self-pay | Admitting: Family Medicine

## 2013-10-07 NOTE — Telephone Encounter (Signed)
LVM for patient to call back. ?

## 2013-10-07 NOTE — Telephone Encounter (Signed)
Negative pap, will ask staff to inform.   Murtis SinkSam Bradshaw, MD [facility] Resident, PGY-3 10/07/2013, 8:44 AM

## 2013-10-12 ENCOUNTER — Encounter: Payer: Self-pay | Admitting: Family Medicine

## 2013-10-12 ENCOUNTER — Ambulatory Visit (INDEPENDENT_AMBULATORY_CARE_PROVIDER_SITE_OTHER): Payer: Medicaid Other | Admitting: Family Medicine

## 2013-10-12 VITALS — BP 122/75 | HR 89 | Ht 65.0 in | Wt 151.0 lb

## 2013-10-12 DIAGNOSIS — N390 Urinary tract infection, site not specified: Secondary | ICD-10-CM

## 2013-10-12 DIAGNOSIS — R300 Dysuria: Secondary | ICD-10-CM

## 2013-10-12 LAB — POCT URINALYSIS DIPSTICK
BILIRUBIN UA: NEGATIVE
GLUCOSE UA: NEGATIVE
Ketones, UA: NEGATIVE
Leukocytes, UA: NEGATIVE
NITRITE UA: POSITIVE
Protein, UA: NEGATIVE
RBC UA: NEGATIVE
Urobilinogen, UA: 0.2
pH, UA: 6

## 2013-10-12 LAB — POCT UA - MICROSCOPIC ONLY

## 2013-10-12 MED ORDER — TAMSULOSIN HCL 0.4 MG PO CAPS: 0.4000 mg | ORAL_CAPSULE | Freq: Every day | ORAL | Status: DC

## 2013-10-12 MED ORDER — CIPROFLOXACIN HCL 500 MG PO TABS: 500.0000 mg | ORAL_TABLET | Freq: Two times a day (BID) | ORAL | Status: DC

## 2013-10-12 NOTE — Assessment & Plan Note (Signed)
A: Recently treated for UTI with Keflex (concomittant with Flagyl for BV); now finished with abx but with persistent symptoms suggestive of possible progression to early pyelonephritis; DDx also includes stone, though I think this is less likely given UA without blood, no spasmodic-type pain, etc. No red flags or significant symptoms to suggest frank PID or acute surgical abdominal pathology; recent labs for STI all negative.  P: Discussed various options with pt; decision made to culture urine and gave Rx's for Cipro BID for 5 days and Flomax until symptoms resolve, as well as strainer given to see if any stone is passed. Advised continued Motrin / Tylenol as needed (offered tramadol or Percocet, but pt declined, for now). Would strongly consider referral to urology if symptoms persist or if urinary tract infections become recurrent or difficult to treat, more often than they already are. Explained to pt that this plan is essentially what Dr. Ermalinda MemosBradshaw had planned if the Keflex did not resolve her symptoms. F/u with Dr. Ermalinda MemosBradshaw as needed, preferably in the next few weeks -- reviewed red flags that would suggest PID, frank pyelo, peritonitis, etc that would require immediate return to care.

## 2013-10-12 NOTE — Progress Notes (Signed)
   Subjective:    Patient ID: Suzanne Ellis, female    DOB: 07/26/1985, 28 y.o.   MRN: [id number]  HPI: Pt presents to clinic for SDA for continued and worse low back pain in the setting of recent treatment for UTI and BV. Her pain is moreso on the right than the left, and is aching in nature. She has taken ibuprofen with minimal relief. She completed a course of Keflex and metronidazole; her urine and vaginal discharge previously had a very foul odor, which is gone. She continues to have vaginal discharge, however. Her urinary discomfort and sensation of incomplete emptying itself is gone, but she is continuing to urinate frequently and drinking more fluids. She does not know of any history of kidney stones. She has had frequent / recurrent UTI's, however.  Of note, pt has had difficulty getting this appointment (called several days last week and was unable to be seen Wednesday through Friday, and was told at Urgent Care that she would need "permission" to be seen there as it was "a chronic problem" and "not an emergency."  Review of Systems: As above.     Objective:   Physical Exam BP 122/75  Pulse 89  Ht 5\' 5"  (1.651 m)  Wt 151 lb (68.493 kg)  BMI 25.13 kg/m2 Gen: well-appearing adult female but clearly uncomfortable especially with changes in position Mouth: MMM, no obvious signs of dehydration Cardio: RRR, no murmurs Pulm: CTAB, no wheezes Abd: soft, nonperitoneal but increased pain with movement, tender over bladder and along right flank, BS+ MSK: marked CVA tenderness bilaterally, R > L  Otherwise back without muscle spasm or bony point tenderness  ROM intact throughout all extremities Neuro: gait normal, alert / oriented, no gross focal deficits  Urinalysis: concentrated, remains nitrite positive, with bacteria and occasional RBC's on microscoy     Assessment & Plan:  See problem list note.

## 2013-10-12 NOTE — Patient Instructions (Addendum)
Thank you for coming in, today!  I think your pain is probably coming from the infection that hasn't gone away, yet. You could have a kidney stone, but I don't think this is likely.  I want you to take ciprofloxacin (Cipro) 500 mg twice a day for 5 days. This is a different antibiotic than the one you took. You should also take a medicine called Flomax (tamsulosin) one pill once a day until your symptoms are gone. This medicine helps your urine flow more easily. You can continue to take ibuprofen or Tylenol for pain. You can also try using AZO Bladder Pain relief medicine over-the-counter.  Come back to see Dr. Ermalinda MemosBradshaw in about 2 weeks. He may want to refer you to urology to see if there is a reason you keep getting infections. If you have worse symptoms, high fever over 101, vomiting, worse pain when you urinate, or severe pain, come back late this week or early next week. Please feel free to call with any questions or concerns at any time, at [phone]. --Dr. Casper HarrisonStreet

## 2013-10-15 ENCOUNTER — Telehealth: Payer: Self-pay | Admitting: Family Medicine

## 2013-10-15 LAB — URINE CULTURE: Colony Count: >=100000

## 2013-10-15 NOTE — Telephone Encounter (Signed)
Pt called back and was informed of message from Dr. Ermalinda MemosBradshaw. Suzanne Jacobsonjw

## 2013-10-15 NOTE — Telephone Encounter (Signed)
Left message on voicemail for patient to call back for results.

## 2013-10-15 NOTE — Telephone Encounter (Signed)
Red Team: Please call pt to let her know that her urine culture grew E.coli and that she should finish her ciprofloxacin, as it should be covering the E.coli adequately. She can follow up with Dr. Ermalinda MemosBradshaw sometime in the next couple of weeks. If she continues to have symptoms despite antibiotics, or  If her symptoms come back, she should follow up sooner rather than later with whomever is available. Thanks! --CMS  Note and result routed to Dr. Vanessa RalphsBradshaw FYI.

## 2013-10-28 ENCOUNTER — Ambulatory Visit (INDEPENDENT_AMBULATORY_CARE_PROVIDER_SITE_OTHER): Payer: Medicaid Other | Admitting: Family Medicine

## 2013-10-28 ENCOUNTER — Encounter: Payer: Self-pay | Admitting: *Deleted

## 2013-10-28 ENCOUNTER — Encounter: Payer: Self-pay | Admitting: Family Medicine

## 2013-10-28 VITALS — BP 115/72 | HR 82 | Temp 98.5°F | Wt 152.0 lb

## 2013-10-28 DIAGNOSIS — F39 Unspecified mood [affective] disorder: Secondary | ICD-10-CM | POA: Insufficient documentation

## 2013-10-28 NOTE — Progress Notes (Signed)
Patient ID: Suzanne Ellis, female   DOB: [birth year], 28 y.o.   MRN: [id number]  Kevin Fenton, MD Phone: [phone]  Subjective:  Chief complaint-noted  Pt Here for evaluation of mood  UTI symotms resolved, some mild back pain  He states that she was previously diagnosed with bipolar disorder approximately 4-5 years ago by a psychologist in our office. She states that since that time she is in a much better place mentally. Her major complaints are generalized fatigue for the last several years.  She denies any episodes of mania, distractibility, grandiosity, loss of sleep for several interrupted, or poor decision-making due to elevated mood. She does not feel depressed, she does feel anxious often.  She does not have any suicidal ideation She would like to avoid sedating medications, she previously had a rash with Lamictal. She's also tried Paxil, Zoloft, and Zyprexa without much improvement and with sedation on the part of Zyprexa.  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 She has no problem running her life in accomplishing the things that she wants. Her major complaint is just fatigue and feeling tired.  She has a positive family history of bipolar disorder, her mother had it.  She had normal thyroid function and hemoglobin last month.  ROS-  Her history of present illness  Past Medical History Patient Active Problem List   Diagnosis Date Noted  . Unspecified episodic mood disorder 10/28/2013  . Other malaise and fatigue 10/02/2013  . UTI (urinary tract infection) 10/02/2013  . Vaginal discharge 05/22/2013  . Bipolar disorder, unspecified 01/19/2013  . Rash and nonspecific skin eruption 07/07/2012  . Contraception management 12/18/2011  . MIGRAINE WITHOUT AURA 09/14/2009  . PTSD 08/18/2009  . PELVIC PAIN, CHRONIC 01/06/2009  . TOBACCO DEPENDENCE 05/16/2006  . DEPRESSIVE DISORDER, NOS  05/16/2006    Medications- reviewed and updated Current Outpatient Prescriptions  Medication Sig Dispense Refill  . ciprofloxacin (CIPRO) 500 MG tablet Take 1 tablet (500 mg total) by mouth 2 (two) times daily.  10 tablet  0  . FLUoxetine (PROZAC) 20 MG tablet Take 1 tablet (20 mg total) by mouth daily.  30 tablet  3  . medroxyPROGESTERone (DEPO-PROVERA) 150 MG/ML injection Inject 150 mg into the muscle every 3 (three) months.      Marland Kitchen OLANZapine (ZYPREXA) 5 MG tablet Take 1 tablet (5 mg total) by mouth at bedtime.  30 tablet  3  . tamsulosin (FLOMAX) 0.4 MG CAPS capsule Take 1 capsule (0.4 mg total) by mouth daily.  30 capsule  0   No current facility-administered medications for this visit.    Objective: BP 115/72  Pulse 82  Temp(Src) 98.5 F (36.9 C) (Oral)  Wt 152 lb (68.947 kg) Gen: NAD, alert, cooperative with exam HEENT: NCAT, Neuro: Alert and oriented, No gross deficits Psych: Normal mood and affect, no SI, normal judgment   Assessment/Plan:  Unspecified episodic mood disorder With fatigue, dx with BPD, PTSD and depressive mood NOS Likely that fatigue is due to this.  Screens: PHQ 9 11 with 0 on number 9, GAD 7- 17 with extremely difficult on both, MDQ screen positive (8/13, yes on 2, serious problem, yes on 4 and 5) I cannot see a good medication choice here adn she is very functional with no episodes of mania she can describe and no limitations in her life, I think it would be very reasonable to not start  any meds at all unless she has limitations or manic episodes.  Recommended evaluation with Dr. Pascal LuxKane again, she liked this previously but its been since 2011

## 2013-10-28 NOTE — Patient Instructions (Signed)
Great to see you today!  Lets plan to see you back in about 3 months  I recommend that you meet with our psychologist, Dr. Spero GeraldsMichelle Kane, for help dealing with your mood.  You can schedule an appointment with her by calling her directly at [phone].

## 2013-10-28 NOTE — Assessment & Plan Note (Addendum)
With fatigue, dx with BPD, PTSD and depressive mood NOS Likely that fatigue is due to this.  Screens: PHQ 9 11 with 0 on number 9, GAD 7- 17 with extremely difficult on both, MDQ screen positive (8/13, yes on 2, serious problem, yes on 4 and 5) I cannot see a good medication choice here adn she is very functional with no episodes of mania she can describe and no limitations in her life, I think it would be very reasonable to not start any meds at all unless she has limitations or manic episodes.  Recommended evaluation with Dr. Pascal LuxKane again, she liked this previously but its been since 2011

## 2014-01-18 ENCOUNTER — Encounter: Payer: Self-pay | Admitting: Family Medicine

## 2014-02-24 ENCOUNTER — Encounter: Payer: Self-pay | Admitting: Family Medicine

## 2014-02-24 ENCOUNTER — Other Ambulatory Visit (HOSPITAL_COMMUNITY)
Admission: RE | Admit: 2014-02-24 | Discharge: 2014-02-24 | Disposition: A | Discharge status: Home / Self Care | Payer: Medicaid Other | Source: Ambulatory Visit | Attending: Family Medicine | Admitting: Family Medicine

## 2014-02-24 ENCOUNTER — Ambulatory Visit (INDEPENDENT_AMBULATORY_CARE_PROVIDER_SITE_OTHER): Payer: Medicaid Other | Admitting: Family Medicine

## 2014-02-24 VITALS — BP 117/77 | HR 98 | Temp 98.5°F | Ht 65.0 in | Wt 151.3 lb

## 2014-02-24 DIAGNOSIS — Z113 Encounter for screening for infections with a predominantly sexual mode of transmission: Secondary | ICD-10-CM | POA: Insufficient documentation

## 2014-02-24 DIAGNOSIS — R8290 Unspecified abnormal findings in urine: Secondary | ICD-10-CM | POA: Insufficient documentation

## 2014-02-24 DIAGNOSIS — Z308 Encounter for other contraceptive management: Secondary | ICD-10-CM

## 2014-02-24 DIAGNOSIS — N898 Other specified noninflammatory disorders of vagina: Secondary | ICD-10-CM

## 2014-02-24 DIAGNOSIS — R109 Unspecified abdominal pain: Secondary | ICD-10-CM

## 2014-02-24 DIAGNOSIS — N390 Urinary tract infection, site not specified: Secondary | ICD-10-CM

## 2014-02-24 LAB — POCT UA - MICROSCOPIC ONLY

## 2014-02-24 LAB — POCT URINE PREGNANCY: Preg Test, Ur: NEGATIVE

## 2014-02-24 LAB — POCT WET PREP (WET MOUNT): Clue Cells Wet Prep Whiff POC: POSITIVE

## 2014-02-24 LAB — POCT URINALYSIS DIPSTICK
BILIRUBIN UA: NEGATIVE
Blood, UA: NEGATIVE
Glucose, UA: NEGATIVE
Ketones, UA: NEGATIVE
NITRITE UA: POSITIVE
Protein, UA: NEGATIVE
Spec Grav, UA: 1.025
Urobilinogen, UA: 0.2
pH, UA: 5.5

## 2014-02-24 MED ORDER — CEPHALEXIN 500 MG PO CAPS: 500.0000 mg | ORAL_CAPSULE | Freq: Three times a day (TID) | ORAL | Status: DC

## 2014-02-24 MED ORDER — FLUCONAZOLE 150 MG PO TABS: 150.0000 mg | ORAL_TABLET | Freq: Once | ORAL | Status: DC

## 2014-02-24 MED ORDER — METRONIDAZOLE 500 MG PO TABS: 500.0000 mg | ORAL_TABLET | Freq: Two times a day (BID) | ORAL | Status: DC

## 2014-02-24 MED ORDER — MEDROXYPROGESTERONE ACETATE 150 MG/ML IM SUSP
150.0000 mg | Freq: Once | INTRAMUSCULAR | Status: AC
  Administered 2014-02-24: 150 mg via INTRAMUSCULAR

## 2014-02-24 NOTE — Assessment & Plan Note (Signed)
UTI, last treated in July with Cipro Treat with Keflex today Sent for culture

## 2014-02-24 NOTE — Assessment & Plan Note (Addendum)
Bacterial vaginosis by wet prep Flagyl 500 twice a day 7 days [facility] Also sending Diflucan for anticipated yeast infection

## 2014-02-24 NOTE — Patient Instructions (Signed)
Great to see you!  Lets see what these results show and I will get up with you.

## 2014-02-24 NOTE — Progress Notes (Signed)
Patient ID: Suzanne Ellis, female   DOB: 07/29/1985, 28 y.o.   MRN: [id number]   HPI  Patient presents today for Va ginal discharge and foul-smelling urine  Foul smelling urne Has been going on for several weeks now. She also has chronic lowerabdomen pain right over her bladder. She denies dysuria, low back pain, urinary freq, fevers, chills, sweats and change in PO intake.   Vaginal tching.dc  Has yellow thin dc, not an increased amount.  Some vaginal itching Has been going on for a few weeks She states she is not sexually acvtive and uses condoms when she is.  She has a Hx of recurrent BV  Smoking status noted ROS: Per HPI  Objective: BP 117/77 mmHg  Pulse 98  Temp(Src) 98.5 F (36.9 C) (Oral)  Ht 5\' 5"  (1.651 m)  Wt 151 lb 4.8 oz (68.629 kg)  BMI 25.18 kg/m2 Gen: NAD, alert, cooperative with exam HEENT: NCAT CV: RRR, good S1/S2, no murmur Resp: CTABL, no wheezes, non-labored Abd: soft throughout, tenderness to palpation of lower abdomen over her bladder as well as in her bilateral adnexa area, no rebound, + BS Ext: No edema, warm Neuro: Alert and oriented, No gross deficits GU: normal-appearing external genitalia, no lesions, spec exam with mod amount of thick white dc, no erythema or lesions of cervix Bimanual with CMT, and tenderness in the adnexa BL without mass  Assessment and plan:  UTI (urinary tract infection) UTI, last treated in July with Cipro Treat with Keflex today Sent for culture  Vaginal discharge Bacterial vaginosis by wet prep Flagyl 500 twice a day 7 days [facility] Also sending Diflucan for anticipated yeast infection    Orders Placed This Encounter  Procedures  . POCT urine pregnancy  . Urinalysis Dipstick  . POCT UA - Microscopic Only    Meds ordered this encounter  Medications  . medroxyPROGESTERone (DEPO-PROVERA) injection 150 mg    Sig:   . DISCONTD: cephALEXin (KEFLEX) 500 MG capsule    Sig: Take 1 capsule (500 mg  total) by mouth 3 (three) times daily.    Dispense:  21 capsule    Refill:  0  . cephALEXin (KEFLEX) 500 MG capsule    Sig: Take 1 capsule (500 mg total) by mouth 3 (three) times daily.    Dispense:  21 capsule    Refill:  0  . metroNIDAZOLE (FLAGYL) 500 MG tablet    Sig: Take 1 tablet (500 mg total) by mouth 2 (two) times daily. Avoid alcohol while taking    Dispense:  14 tablet    Refill:  0  . fluconazole (DIFLUCAN) 150 MG tablet    Sig: Take 1 tablet (150 mg total) by mouth once. And repeat in 3 days    Dispense:  2 tablet    Refill:  0

## 2014-02-25 LAB — CERVICOVAGINAL ANCILLARY ONLY
Chlamydia: NEGATIVE
Neisseria Gonorrhea: NEGATIVE

## 2014-02-27 LAB — URINE CULTURE: Colony Count: >=100000

## 2014-03-01 ENCOUNTER — Telehealth: Payer: Self-pay | Admitting: Family Medicine

## 2014-03-01 NOTE — Telephone Encounter (Signed)
GCC negative. E coli sensitive to Keflex.   Will ask nursing to inform.   Murtis SinkSam Shlok Raz, MD [facility] Resident, PGY-3 03/01/2014, 11:02 AM

## 2014-03-01 NOTE — Telephone Encounter (Signed)
Left message on voicemail for patient to call back. 

## 2014-03-01 NOTE — Telephone Encounter (Signed)
Patient informed. 

## 2014-03-08 ENCOUNTER — Telehealth: Payer: Self-pay | Admitting: Family Medicine

## 2014-03-08 NOTE — Telephone Encounter (Signed)
No phone calls made to patient today.

## 2014-03-08 NOTE — Telephone Encounter (Signed)
Pt said that we called her today and she would like a return call back. jw

## 2014-05-08 ENCOUNTER — Emergency Department (HOSPITAL_COMMUNITY): Payer: Medicaid Other

## 2014-05-08 ENCOUNTER — Emergency Department (HOSPITAL_COMMUNITY)
Admission: EM | Admit: 2014-05-08 | Discharge: 2014-05-08 | Disposition: A | Discharge status: Home / Self Care | Payer: Medicaid Other | Attending: Emergency Medicine | Admitting: Emergency Medicine

## 2014-05-08 ENCOUNTER — Encounter (HOSPITAL_COMMUNITY): Payer: Self-pay | Admitting: *Deleted

## 2014-05-08 DIAGNOSIS — Z720 Tobacco use: Secondary | ICD-10-CM | POA: Insufficient documentation

## 2014-05-08 DIAGNOSIS — J020 Streptococcal pharyngitis: Secondary | ICD-10-CM

## 2014-05-08 DIAGNOSIS — R52 Pain, unspecified: Secondary | ICD-10-CM | POA: Diagnosis present

## 2014-05-08 DIAGNOSIS — Z79899 Other long term (current) drug therapy: Secondary | ICD-10-CM | POA: Diagnosis not present

## 2014-05-08 LAB — RAPID STREP SCREEN (MED CTR MEBANE ONLY): Streptococcus, Group A Screen (Direct): POSITIVE — AB

## 2014-05-08 MED ORDER — PENICILLIN G BENZATHINE 1200000 UNIT/2ML IM SUSP
1.2000 10*6.[IU] | Freq: Once | INTRAMUSCULAR | Status: AC
  Administered 2014-05-08: 1.2 10*6.[IU] via INTRAMUSCULAR
  Filled 2014-05-08: qty 2

## 2014-05-08 MED ORDER — SODIUM CHLORIDE 0.9 % IV BOLUS (SEPSIS)
1000.0000 mL | Freq: Once | INTRAVENOUS | Status: AC
  Administered 2014-05-08: 1000 mL via INTRAVENOUS

## 2014-05-08 MED ORDER — DEXAMETHASONE SODIUM PHOSPHATE 10 MG/ML IJ SOLN
10.0000 mg | Freq: Once | INTRAMUSCULAR | Status: AC
  Administered 2014-05-08: 10 mg via INTRAVENOUS
  Filled 2014-05-08: qty 1

## 2014-05-08 MED ORDER — TRAMADOL HCL 50 MG PO TABS: 50.0000 mg | ORAL_TABLET | Freq: Four times a day (QID) | ORAL | Status: DC | PRN

## 2014-05-08 MED ORDER — KETOROLAC TROMETHAMINE 30 MG/ML IJ SOLN
30.0000 mg | Freq: Once | INTRAMUSCULAR | Status: AC
  Administered 2014-05-08: 30 mg via INTRAVENOUS
  Filled 2014-05-08: qty 1

## 2014-05-08 MED ORDER — MAGIC MOUTHWASH W/LIDOCAINE: 5.0000 mL | Freq: Three times a day (TID) | ORAL | Status: DC | PRN

## 2014-05-08 MED ORDER — IBUPROFEN 600 MG PO TABS: 600.0000 mg | ORAL_TABLET | Freq: Four times a day (QID) | ORAL | Status: DC | PRN

## 2014-05-08 NOTE — ED Provider Notes (Signed)
CSN: [id number]     Arrival date & time 05/08/14  0455 History   First MD Initiated Contact with Patient 05/08/14 [phone]     Chief Complaint  Patient presents with  . Generalized Body Aches     (Consider location/radiation/quality/duration/timing/severity/associated sxs/prior Treatment) HPI Comments: Pt comes in with cc of body aches, sore throat, headaches, nausea. Pt is healthy female, and her sx started on Wednesday. She has subjective fevers. Pt has pain with swallowing. No neck pain, or stiffness. No confusion. Pt has some congestion and ear ache. She has no cough, chest pain shortness of breath.   The history is provided by the patient.    Past Medical History  Diagnosis Date  . No pertinent past medical history   . CHLAMYDTRACHOMATIS INFECTION LOWER GU SITES 07/28/2008    Qualifier: Diagnosis of  By: Jake Sharkhaney RN, Amy     Past Surgical History  Procedure Laterality Date  . Dilation and curettage of uterus    . Arm wound repair / closure    . Termination    . Wisdom tooth extraction     Family History  Problem Relation Age of Onset  . Diabetes Mother   . Hypertension Mother   . Cancer Mother     lung  . Diabetes Father   . Hypertension Father   . Hypotension Neg Hx   . Malignant hyperthermia Neg Hx   . Pseudochol deficiency Neg Hx   . Anesthesia problems Other     MGF allergic to a common type of anesthesia  . Cancer Maternal Grandmother     skin   History  Substance Use Topics  . Smoking status: Current Every Day Smoker -- 1.00 packs/day for 10 years    Types: Cigarettes  . Smokeless tobacco: Never Used  . Alcohol Use: Yes     Comment: occasionally    OB History    Gravida Para Term Preterm AB TAB SAB Ectopic Multiple Living   5 3 3  0 2 2 0 0 0 3     Review of Systems  Constitutional: Positive for activity change.  HENT: Positive for congestion, sore throat and voice change. Negative for drooling.   Respiratory: Negative for shortness of breath and  stridor.   Cardiovascular: Negative for chest pain.  Gastrointestinal: Positive for nausea. Negative for vomiting and abdominal pain.  Genitourinary: Negative for dysuria.  Musculoskeletal: Negative for neck pain.  Neurological: Positive for headaches.      Allergies  Lamictal  Home Medications   Prior to Admission medications   Medication Sig Start Date End Date Taking? Authorizing Provider  ibuprofen (ADVIL,MOTRIN) 200 MG tablet Take 600-800 mg by mouth every 6 (six) hours as needed for moderate pain.   Yes Historical Provider, MD  medroxyPROGESTERone (DEPO-PROVERA) 150 MG/ML injection Inject 150 mg into the muscle every 3 (three) months.   Yes Historical Provider, MD  Alum & Mag Hydroxide-Simeth (MAGIC MOUTHWASH W/LIDOCAINE) SOLN Take 5 mLs by mouth 3 (three) times daily as needed for mouth pain. 05/08/14   Derwood KaplanAnkit Aerith Canal, MD  cephALEXin (KEFLEX) 500 MG capsule Take 1 capsule (500 mg total) by mouth 3 (three) times daily. Patient not taking: Reported on 05/08/2014 02/24/14   Elenora GammaSamuel L Bradshaw, MD  ciprofloxacin (CIPRO) 500 MG tablet Take 1 tablet (500 mg total) by mouth 2 (two) times daily. Patient not taking: Reported on 05/08/2014 10/12/13   Stephanie Couphristopher M Street, MD  fluconazole (DIFLUCAN) 150 MG tablet Take 1 tablet (150 mg total) by mouth  once. And repeat in 3 days Patient not taking: Reported on 05/08/2014 02/24/14   Elenora Gamma, MD  FLUoxetine (PROZAC) 20 MG tablet Take 1 tablet (20 mg total) by mouth daily. Patient not taking: Reported on 05/08/2014 07/07/13   Charm Rings, MD  ibuprofen (ADVIL,MOTRIN) 600 MG tablet Take 1 tablet (600 mg total) by mouth every 6 (six) hours as needed. 05/08/14   Derwood Kaplan, MD  metroNIDAZOLE (FLAGYL) 500 MG tablet Take 1 tablet (500 mg total) by mouth 2 (two) times daily. Avoid alcohol while taking Patient not taking: Reported on 05/08/2014 02/24/14   Elenora Gamma, MD  OLANZapine (ZYPREXA) 5 MG tablet Take 1 tablet (5 mg total) by mouth at  bedtime. Patient not taking: Reported on 05/08/2014 07/07/13   Charm Rings, MD  tamsulosin (FLOMAX) 0.4 MG CAPS capsule Take 1 capsule (0.4 mg total) by mouth daily. Patient not taking: Reported on 05/08/2014 10/12/13   Stephanie Coup Street, MD  traMADol (ULTRAM) 50 MG tablet Take 1 tablet (50 mg total) by mouth every 6 (six) hours as needed. 05/08/14   Shailyn Weyandt, MD   BP 110/53 mmHg  Pulse 101  Temp(Src) 99.8 F (37.7 C) (Oral)  Resp 13  Ht  (1.651 m)  Wt 146 lb (66.225 kg)  BMI 24.30 kg/m2  SpO2 100% Physical Exam  Constitutional: She is oriented to person, place, and time. She appears well-developed and well-nourished.  HENT:  Head: Normocephalic and atraumatic.  Eyes: EOM are normal. Pupils are equal, round, and reactive to light.  Neck: Neck supple.  L sided tonsillar enlargement. No nuchal rigidity  Cardiovascular: Regular rhythm.   Pulmonary/Chest: Effort normal. No stridor. No respiratory distress.  Abdominal: Soft. She exhibits no distension. There is no tenderness. There is no rebound and no guarding.  Lymphadenopathy:    She has no cervical adenopathy.  Neurological: She is alert and oriented to person, place, and time.  Skin: Skin is warm and dry. No rash noted.  Nursing note and vitals reviewed.   ED Course  Procedures (including critical care time) Labs Review Labs Reviewed  RAPID STREP SCREEN - Abnormal; Notable for the following:    Streptococcus, Group A Screen (Direct) POSITIVE (*)    All other components within normal limits    Imaging Review Dg Neck Soft Tissue  05/08/2014   CLINICAL DATA:  Body aches with fever since Wednesday.  EXAM: NECK SOFT TISSUES - 1+ VIEW  COMPARISON:  None.  FINDINGS: There is no evidence of retropharyngeal soft tissue swelling or epiglottic enlargement. The cervical airway is unremarkable and no radio-opaque foreign body identified.  IMPRESSION: Negative.   Electronically Signed   By: Ellery Plunk M.D.   On:  05/08/2014 06:37     EKG Interpretation None      MDM   Final diagnoses:  Streptococcal pharyngitis    Pt comes in with cc of weakness. She is noted to have a mild fevers, and tachycardia. Concerns initially for strep pharyngitis, mono, and influenza//viral syndrome. Strep results are +. Pt given 2 liters of ivf, toradol and decadron. We gave Penicillin G IM for the infection.  Pt has no hard signs of airway compromise. Xrays soft tissue neck shows no signs of deep infection.  Pt advised to hydrate well. She has been given pain meds and strict return precautions discussed.   Derwood Kaplan, MD 05/08/14 [phone]

## 2014-05-08 NOTE — ED Notes (Signed)
Patient transported to X-ray 

## 2014-05-08 NOTE — ED Notes (Signed)
The pt has had body aches headache nausea since Wednesday  Sore throat today.  lmp none bc

## 2014-05-08 NOTE — Discharge Instructions (Signed)
You have a strep infection. Antibiotic shot has been given. Please hydrate well.  Return to the ER if your symptoms get worse - you are unable to keep fluids down, feel like you might pass out, have trouble breathing.   Pharyngitis Pharyngitis is redness, pain, and swelling (inflammation) of your pharynx.  CAUSES  Pharyngitis is usually caused by infection. Most of the time, these infections are from viruses (viral) and are part of a cold. However, sometimes pharyngitis is caused by bacteria (bacterial). Pharyngitis can also be caused by allergies. Viral pharyngitis may be spread from person to person by coughing, sneezing, and personal items or utensils (cups, forks, spoons, toothbrushes). Bacterial pharyngitis may be spread from person to person by more intimate contact, such as kissing.  SIGNS AND SYMPTOMS  Symptoms of pharyngitis include:   Sore throat.   Tiredness (fatigue).   Low-grade fever.   Headache.  Joint pain and muscle aches.  Skin rashes.  Swollen lymph nodes.  Plaque-like film on throat or tonsils (often seen with bacterial pharyngitis). DIAGNOSIS  Your health care provider will ask you questions about your illness and your symptoms. Your medical history, along with a physical exam, is often all that is needed to diagnose pharyngitis. Sometimes, a rapid strep test is done. Other lab tests may also be done, depending on the suspected cause.  TREATMENT  Viral pharyngitis will usually get better in 3-4 days without the use of medicine. Bacterial pharyngitis is treated with medicines that kill germs (antibiotics).  HOME CARE INSTRUCTIONS   Drink enough water and fluids to keep your urine clear or pale yellow.   Only take over-the-counter or prescription medicines as directed by your health care provider:   If you are prescribed antibiotics, make sure you finish them even if you start to feel better.   Do not take aspirin.   Get lots of rest.   Gargle  with 8 oz of salt water ( tsp of salt per 1 qt of water) as often as every 1-2 hours to soothe your throat.   Throat lozenges (if you are not at risk for choking) or sprays may be used to soothe your throat. SEEK MEDICAL CARE IF:   You have large, tender lumps in your neck.  You have a rash.  You cough up green, yellow-brown, or bloody spit. SEEK IMMEDIATE MEDICAL CARE IF:   Your neck becomes stiff.  You drool or are unable to swallow liquids.  You vomit or are unable to keep medicines or liquids down.  You have severe pain that does not go away with the use of recommended medicines.  You have trouble breathing (not caused by a stuffy nose). MAKE SURE YOU:   Understand these instructions.  Will watch your condition.  Will get help right away if you are not doing well or get worse. Document Released: 03/05/2005 Document Revised: 12/24/2012 Document Reviewed: 11/10/2012 [facility] Patient Information 2015 [employer] [place]. This information is not intended to replace advice given to you by your health care provider. Make sure you discuss any questions you have with your health care provider.

## 2014-07-21 ENCOUNTER — Ambulatory Visit: Payer: Medicaid Other | Admitting: Family Medicine

## 2014-07-21 ENCOUNTER — Ambulatory Visit (INDEPENDENT_AMBULATORY_CARE_PROVIDER_SITE_OTHER): Payer: Medicaid Other | Admitting: *Deleted

## 2014-07-21 DIAGNOSIS — Z3042 Encounter for surveillance of injectable contraceptive: Secondary | ICD-10-CM

## 2014-07-21 LAB — POCT URINE PREGNANCY: Preg Test, Ur: NEGATIVE

## 2014-07-21 MED ORDER — MEDROXYPROGESTERONE ACETATE 150 MG/ML IM SUSP
150.0000 mg | Freq: Once | INTRAMUSCULAR | Status: AC
  Administered 2014-07-21: 150 mg via INTRAMUSCULAR

## 2014-07-21 NOTE — Progress Notes (Signed)
Pt late for Depo Provera injection. Pregnancy test ordered; results negative. Pt tolerated Depo injection. Depo given Right upper outer quadrant. Next injection due July 20- October 25, 2014. Reminder card given.  Suzanne Ellis,CMA

## 2014-11-23 ENCOUNTER — Other Ambulatory Visit (HOSPITAL_COMMUNITY)
Admission: RE | Admit: 2014-11-23 | Discharge: 2014-11-23 | Disposition: A | Discharge status: Home / Self Care | Payer: No Typology Code available for payment source | Source: Ambulatory Visit | Attending: Family Medicine | Admitting: Family Medicine

## 2014-11-23 ENCOUNTER — Ambulatory Visit (INDEPENDENT_AMBULATORY_CARE_PROVIDER_SITE_OTHER): Payer: No Typology Code available for payment source | Admitting: Family Medicine

## 2014-11-23 ENCOUNTER — Telehealth: Payer: Self-pay | Admitting: Family Medicine

## 2014-11-23 ENCOUNTER — Encounter: Payer: Self-pay | Admitting: Family Medicine

## 2014-11-23 VITALS — BP 136/60 | HR 100 | Temp 98.6°F | Ht 65.0 in | Wt 149.1 lb

## 2014-11-23 DIAGNOSIS — F319 Bipolar disorder, unspecified: Secondary | ICD-10-CM

## 2014-11-23 DIAGNOSIS — N898 Other specified noninflammatory disorders of vagina: Secondary | ICD-10-CM

## 2014-11-23 DIAGNOSIS — R102 Pelvic and perineal pain: Secondary | ICD-10-CM

## 2014-11-23 DIAGNOSIS — N760 Acute vaginitis: Secondary | ICD-10-CM | POA: Diagnosis not present

## 2014-11-23 DIAGNOSIS — Z113 Encounter for screening for infections with a predominantly sexual mode of transmission: Secondary | ICD-10-CM | POA: Diagnosis not present

## 2014-11-23 LAB — POCT WET PREP (WET MOUNT): Clue Cells Wet Prep Whiff POC: POSITIVE

## 2014-11-23 LAB — POCT URINE PREGNANCY: PREG TEST UR: NEGATIVE

## 2014-11-23 LAB — HIV ANTIBODY (ROUTINE TESTING W REFLEX): HIV 1&2 Ab, 4th Generation: NONREACTIVE

## 2014-11-23 LAB — RPR

## 2014-11-23 MED ORDER — METRONIDAZOLE 0.75 % VA GEL: 1.0000 | VAGINAL | Status: DC

## 2014-11-23 MED ORDER — FLUCONAZOLE 150 MG PO TABS: 150.0000 mg | ORAL_TABLET | Freq: Once | ORAL | Status: DC

## 2014-11-23 MED ORDER — METRONIDAZOLE 500 MG PO TABS: 500.0000 mg | ORAL_TABLET | Freq: Two times a day (BID) | ORAL | Status: DC

## 2014-11-23 NOTE — Assessment & Plan Note (Signed)
Pt interested in psychiatry referral. As she has insurance, recommended she call a local private psychiatry office for evaluation & management. Given information for Dr. Andee Poles.

## 2014-11-23 NOTE — Patient Instructions (Signed)
For mood: Call Dr. Andee Poles for an appointment. Address: [address] [place], [place] [address] Phone: [phone]  Testing for infections today Will call you with results Also setting up for pelvic ultrasound Follow up with Dr. Wende Mott in 3-4 weeks  Be well, Dr. Pollie Meyer

## 2014-11-23 NOTE — Progress Notes (Signed)
Patient ID: Suzanne Ellis, female   DOB: [birth year], 29 y.o.   MRN: [id number]  HPI:  Vaginal symptoms: pt reports hx of recurrent BV. Thinks she has this now. Has had one female sexual partner in the last year. Uses condoms with sex. Has not changed soaps. Does not get periods as she is on depo for several years. Does have hx of PID and has chronic mild pelvic pain as a result of this. Having some mild cramping in pelvic area recently, R>L. Endorses yellow discharge that is malodorous. No itching. No dysuria.  Mood concerns: reports hx of diagnosis of bipolar disorder 6-7 years ago. Previously came to our clinic for this and saw Dr. Pascal Lux for therapy, also seen in mood disorder clinic. Previously treated with lamictal, but this had to be stopped due to rash. Has not been on any psychiatric meds in several years. Has noticed her mood causing increased problems recently. Has decreased interest in things, trouble focusing. Endorses irritability. 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 Denies SI/HI. Would like referral to a psychiatrist.  ROS: See HPI.  PMFSH: hx bipolar disorder/depression/PTSD, chronic pelvic pain  PHYSICAL EXAM: BP 136/60 mmHg  Pulse 100  Temp(Src) 98.6 F (37 C) (Oral)  Ht  (1.651 m)  Wt 149 lb 1.6 oz (67.631 kg)  BMI 24.81 kg/m2 Gen: NAD, pleasant, cooperative HEENT: NCAT Neuro: grossly nonfocal, speech normal GU: normal appearing external genitalia without lesions. Vagina is moist with white discharge. Cervix normal in appearance. Mild pain with entirety of pelvic exam, but no true cervical motion tenderness or tenderness on bimanual exam. No adnexal masses.  Psych: normal range of affect, well groomed, speech normal in rate and volume, normal eye contact. No SI/HI.  ASSESSMENT/PLAN:  Bipolar disorder Pt interested in psychiatry referral. As she has insurance, recommended she call a local private psychiatry office for  evaluation & management. Given information for Dr. Andee Poles.  Vaginal discharge Urine pregnancy negative. Send wet prep/gc/chlamydia. Up to date on pap. If wet prep + for BV, will discuss with pt chronic suppressive therapy for recurrent BV. Also complete STD workup with HIV, RPR, hepatitis panel Given ongoing pelvic pain with no recent pelvic imaging, will obtain pelvic u/s. F/u with PCP in 3-4 weeks   FOLLOW UP: F/u in 3-4 weeks with PCP for pelvic symptoms Pt to schedule appt with psychiatrist Dr. Nolen Mu.  [place] J. Pollie Meyer, MD [facility]

## 2014-11-23 NOTE — Assessment & Plan Note (Signed)
Urine pregnancy negative. Send wet prep/gc/chlamydia. Up to date on pap. If wet prep + for BV, will discuss with pt chronic suppressive therapy for recurrent BV. Also complete STD workup with HIV, RPR, hepatitis panel Given ongoing pelvic pain with no recent pelvic imaging, will obtain pelvic u/s. F/u with PCP in 3-4 weeks

## 2014-11-23 NOTE — Telephone Encounter (Signed)
Pt called to get results from this morning's visit. Reviewed wet prep results with patient - mild yeast (asymptomatic, no itching) - does have BV  Recommend metronidazole  BID x 1 week, followed by suppressive therapy with metrogel 1 applicatorfull twice weekly for several months.  Also, will rx diflucan  to be taken x 1 after course of PO flagyl completed, repeat in 3 days if needed.  Pt appreciative of call. I will call her when I get the rest of her STD results.  Latrelle Dodrill, MD

## 2014-11-24 LAB — HEPATITIS PANEL, ACUTE
HCV Ab: NEGATIVE
HEP B C IGM: NONREACTIVE
HEP B S AG: NEGATIVE
Hep A IgM: NONREACTIVE

## 2014-11-24 LAB — CERVICOVAGINAL ANCILLARY ONLY
Chlamydia: NEGATIVE
Neisseria Gonorrhea: NEGATIVE
Trichomonas: NEGATIVE

## 2014-12-03 ENCOUNTER — Telehealth: Payer: Self-pay | Admitting: Family Medicine

## 2014-12-03 MED ORDER — METRONIDAZOLE 500 MG PO TABS: 500.0000 mg | ORAL_TABLET | Freq: Two times a day (BID) | ORAL | Status: DC

## 2014-12-03 NOTE — Telephone Encounter (Signed)
Patient recently seen for BV by Dr. Pollie Meyer, will forward to her. Of note, patient has a history of coming in late for depo shots (was late getting the last 3 injections and required pregnancy test before recieving). This is most likely the cause of her irregular bleeding.

## 2014-12-03 NOTE — Telephone Encounter (Signed)
Returned call to patient. She was not able to get metrogel rx filled due to price, but is working with pharmacy & insurance to bring the price down. Would like another course of oral flagyl. This is reasonable. Regarding her irregular bleeding - advised she needs an appointment to discuss this further,not something we can really address well over the phone.  Patient appreciative. rx sent in for flagyl. Latrelle Dodrill, MD

## 2014-12-03 NOTE — Telephone Encounter (Signed)
Suzanne Ellis called to ask for rx for her  BV .  Previous rx didn't help.  Also concerned about her irregular menstrual cycle.  Happens every time she gets her depo.  Would like for provider to get in touch with her.

## 2014-12-07 ENCOUNTER — Ambulatory Visit (INDEPENDENT_AMBULATORY_CARE_PROVIDER_SITE_OTHER): Payer: No Typology Code available for payment source | Admitting: *Deleted

## 2014-12-07 ENCOUNTER — Encounter: Payer: Self-pay | Admitting: *Deleted

## 2014-12-07 ENCOUNTER — Ambulatory Visit (HOSPITAL_COMMUNITY): Admission: RE | Admit: 2014-12-07 | Payer: Medicaid Other | Source: Ambulatory Visit

## 2014-12-07 DIAGNOSIS — Z3042 Encounter for surveillance of injectable contraceptive: Secondary | ICD-10-CM | POA: Diagnosis not present

## 2014-12-07 LAB — POCT URINE PREGNANCY: PREG TEST UR: NEGATIVE

## 2014-12-07 MED ORDER — MEDROXYPROGESTERONE ACETATE 150 MG/ML IM SUSP
150.0000 mg | Freq: Once | INTRAMUSCULAR | Status: AC
  Administered 2014-12-07: 150 mg via INTRAMUSCULAR

## 2014-12-07 NOTE — Progress Notes (Signed)
   Pt late for Depo Provera injection.  Pregnancy test ordered; results negative.  Pt tolerated Depo injection. Depo given left upper outer quadrant.  Next injection due Dec 6-Dec. 20, 2016.  Reminder card given. Clovis Pu, RN

## 2015-02-21 ENCOUNTER — Ambulatory Visit (INDEPENDENT_AMBULATORY_CARE_PROVIDER_SITE_OTHER): Payer: 59 | Admitting: *Deleted

## 2015-02-21 DIAGNOSIS — Z3042 Encounter for surveillance of injectable contraceptive: Secondary | ICD-10-CM

## 2015-02-21 MED ORDER — MEDROXYPROGESTERONE ACETATE 150 MG/ML IM SUSP
150.0000 mg | Freq: Once | INTRAMUSCULAR | Status: AC
  Administered 2015-02-21: 150 mg via INTRAMUSCULAR

## 2015-02-21 NOTE — Progress Notes (Signed)
Last dose given 12-07-14 and due 02/22/15-03/08/15.  Ok given per clinic supervisor ok to give 1 day early.  Next dose due 05/09/15-05/23/15.  Pt tolerated well and reminder card given. Vetta Couzens,CMA

## 2015-03-30 ENCOUNTER — Ambulatory Visit (INDEPENDENT_AMBULATORY_CARE_PROVIDER_SITE_OTHER): Payer: 59 | Admitting: Internal Medicine

## 2015-03-30 ENCOUNTER — Encounter: Payer: Self-pay | Admitting: Internal Medicine

## 2015-03-30 VITALS — BP 120/66 | HR 102 | Temp 98.5°F | Resp 20 | Ht 65.0 in | Wt 147.0 lb

## 2015-03-30 DIAGNOSIS — N898 Other specified noninflammatory disorders of vagina: Secondary | ICD-10-CM | POA: Diagnosis not present

## 2015-03-30 LAB — POCT WET PREP (WET MOUNT): Clue Cells Wet Prep Whiff POC: POSITIVE

## 2015-03-30 MED ORDER — METRONIDAZOLE 500 MG PO TABS: 500.0000 mg | ORAL_TABLET | Freq: Two times a day (BID) | ORAL | Status: DC

## 2015-03-30 MED ORDER — FLUCONAZOLE 150 MG PO TABS: 150.0000 mg | ORAL_TABLET | Freq: Once | ORAL | Status: DC

## 2015-03-30 NOTE — Progress Notes (Addendum)
Patient ID: Suzanne Ellis, female   DOB: 08/27/1985, 30 y.o.   MRN: [id number]     [facility] Suzanne CharsAsiyah Emoni Yang, MD Phone: [phone]  Reason For Visit: Vaginal Discharge, Acute Visit  # Patient with hx of bacterial vaginosis since September. Has had two infections of bacterial vaginosis in the past year. Now with another infection. Patient currently with increased odor and discharge. Worried about the odor due affecting work hours. Patient does have some low grade pelvic pain. However states that this has been ongoing for years and is unchanged. Patient is not sexually active, and has not been so in over a year. She does not feel that she needs to be checked for STDs. Denies any dysuria, fever, chills, or n/v  Past Medical History - Hx of Bacterial vaginosis, Chronic Abdominal Pain  Reviewed problem list.  Medications- reviewed and updated  Objective: BP 120/66 mmHg  Pulse 102  Temp(Src) 98.5 F (36.9 C) (Oral)  Resp 20  Ht 5\' 5"  (1.651 m)  Wt 147 lb (66.679 kg)  BMI 24.46 kg/m2  SpO2 98% Gen: NAD, alert, cooperative with exam Abd: SNTND, BS present, no guarding or organomegaly GU exam: Fishy vaginal smell, white discharge, no purulence coming from cervical os, normal cervix, normal external genitalia, no cervical tenderness Skin: no rashes no lesions  Assessment/Plan:  Vaginal discharge Vaginal Discharge: - Wet prep positive for clue cells and bacteria, positive whiff test - NO other STD testing as patient has not been sexually active for over a year.  - Metronidazole BID x 7 days + Diflucan after treatment for Candidal infection  - In future could consider trying tinidazole if no resolution of bacterial vaginosis, also consider referral to OBGYN if no improvement  - Thought about using Boric acid, however not on formulary in vaginal suppository form.

## 2015-03-30 NOTE — Patient Instructions (Signed)
Take Flagyl for the next 7 days, and can take diflucan if yeast infection afterwards. Boric acid is not available in current format needed, please return if needed for referral to gynecologist.   Noralee CharsAsiyah Mikell, MD

## 2015-03-30 NOTE — Assessment & Plan Note (Signed)
Vaginal Discharge: - Wet prep positive for clue cells and bacteria, positive whiff test - NO other STD testing as patient has not been sexually active for over a year.  - Metronidazole BID x 7 days + Diflucan after treatment for Candidal infection  - In future could consider trying tinidazole if no resolution of bacterial vaginosis, also consider referral to OBGYN if no improvement  - Thought about using Boric acid, however not on formulary in vaginal suppository form.

## 2015-03-30 NOTE — Progress Notes (Signed)
Patient here for STD checking.  Patient complains of BV symptoms being constant and former treatments giving minimal relief with symptoms reoccurring. Patient is not sexually active.

## 2015-05-21 ENCOUNTER — Other Ambulatory Visit: Payer: Self-pay | Admitting: Internal Medicine

## 2015-07-11 ENCOUNTER — Ambulatory Visit (INDEPENDENT_AMBULATORY_CARE_PROVIDER_SITE_OTHER): Payer: Medicaid Other | Admitting: *Deleted

## 2015-07-11 DIAGNOSIS — Z3042 Encounter for surveillance of injectable contraceptive: Secondary | ICD-10-CM

## 2015-07-11 LAB — POCT URINE PREGNANCY: Preg Test, Ur: NEGATIVE

## 2015-07-11 MED ORDER — MEDROXYPROGESTERONE ACETATE 150 MG/ML IM SUSP
150.0000 mg | Freq: Once | INTRAMUSCULAR | Status: AC
  Administered 2015-07-11: 150 mg via INTRAMUSCULAR

## 2015-07-11 NOTE — Progress Notes (Signed)
   Pt late for Depo Provera injection.  Pregnancy test ordered; results negative. Pt tolerated Depo injection. Depo given right upper outer quadrant.  Next injection due July 10-24, 2017.  Reminder card given. Clovis PuMartin, Le Faul[place] L, RN

## 2015-08-11 ENCOUNTER — Ambulatory Visit: Payer: Medicaid Other | Admitting: Family Medicine

## 2015-08-26 ENCOUNTER — Encounter: Payer: Self-pay | Admitting: Family Medicine

## 2015-08-26 ENCOUNTER — Ambulatory Visit (INDEPENDENT_AMBULATORY_CARE_PROVIDER_SITE_OTHER): Payer: Self-pay | Admitting: Family Medicine

## 2015-08-26 VITALS — BP 121/74 | HR 99 | Temp 99.6°F | Wt 151.0 lb

## 2015-08-26 DIAGNOSIS — Z8742 Personal history of other diseases of the female genital tract: Secondary | ICD-10-CM

## 2015-08-26 DIAGNOSIS — N898 Other specified noninflammatory disorders of vagina: Secondary | ICD-10-CM

## 2015-08-26 LAB — POCT WET PREP (WET MOUNT)
Clue Cells Wet Prep Whiff POC: NEGATIVE
WBC, Wet Prep HPF POC: >20

## 2015-08-26 MED ORDER — DOXYCYCLINE HYCLATE 100 MG PO CAPS: 100.0000 mg | ORAL_CAPSULE | Freq: Two times a day (BID) | ORAL | Status: DC

## 2015-08-26 MED ORDER — FLUCONAZOLE 150 MG PO TABS: ORAL_TABLET | ORAL | Status: DC

## 2015-08-26 NOTE — Progress Notes (Signed)
Subjective:    Patient ID: Suzanne Ellis, female    DOB: 07-Feb-1986, 30 y.o.   MRN: [id number]  Suzanne Ellis is a 30 y.o. female presenting on 08/26/2015 for Vaginal Discharge   Patient presents for a same day appointment.   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 Describes current symptoms with vaginal discharge and significant odor, present for several weeks to months, now worsening recently over past few days. Last episode of BV 03/2015 treated at [facility], unable to afford Metrogel, used Flagyl PO with relief initially then symptoms do return. - Admits to history PID around age 53, with some residual chronic lower abdominal discomfort. Prior history of Chlamydia positive 2012, otherwise all recent GC/Chlamydia tests were all negative most recent 11/2014 - Currently admits to reduced sexual activity, now last episode 07/15/15 used condoms, previously not always. No new sexual partners in past several months - Contraception - on Depo Provera, no missed doses, last 06/2015 - Uses same soap, without any changes. Tries to adhere to an appropriate diet. - Denies any fevers/chills, upper abdominal pain, nausea, vomiting, rash or lesions   Social History  Substance Use Topics  . Smoking status: Current Every Day Smoker -- 1.00 packs/day for 10 years    Types: Cigarettes  . Smokeless tobacco: Never Used  . Alcohol Use: Yes     Comment: occasionally     Review of Systems Per HPI unless specifically indicated above     Objective:    BP 121/74 mmHg  Pulse 99  Temp(Src) 99.6 F (37.6 C) (Oral)  Wt 151 lb (68.493 kg)  SpO2 100%  Wt Readings from Last 3 Encounters:  08/26/15 151 lb (68.493 kg)  03/30/15 147 lb (66.679 kg)  11/23/14 149 lb 1.6 oz (67.631 kg)    Physical Exam  Constitutional: She appears well-developed and well-nourished. No distress.    Well-appearing, comfortable, cooperative  Abdominal: Soft. She exhibits no distension. There is no tenderness.  Genitourinary:  Normal external female genitalia. Vaginal canal without lesions. Normal appearing cervix with some increased thicker greenish mucus difficult to determine if directly from cervix or not, without lesions or bleeding. Bimanual exam with some pressure and discomfort but not clearly cervical motion tenderness, no adnexal mass appreciated.  Skin: Skin is warm and dry. She is not diaphoretic.  Nursing note and vitals reviewed.  Results for orders placed or performed in visit on 08/26/15  POCT Wet Prep [facility])  Result Value Ref Range   Source Wet Prep POC VAG    WBC, Wet Prep HPF POC >20    Bacteria Wet Prep HPF POC Many (A) None, Few, Too numerous to count   Clue Cells Wet Prep HPF POC None None, Too numerous to count   Clue Cells Wet Prep Whiff POC Negative Whiff    Yeast Wet Prep HPF POC None    Trichomonas Wet Prep HPF POC NONE       Assessment & Plan:   Problem List Items Addressed This Visit    Vaginal discharge - Primary   Relevant Medications   doxycycline (VIBRAMYCIN) 100 MG capsule   fluconazole (DIFLUCAN) 150 MG tablet   Other Relevant Orders   POCT Wet Prep [facility]) (Completed)   History of chronic PID      Chronic history with recurrent BV / vaginal discharge, currently without any significant change  in her symptoms, no clear risk factors for triggering BV recently given condom use and no new partners also infrequent sexual intercourse over past 3 months. No recent antibiotics or other changes. Concern with prior history of PID suspect some component of chronic pelvic pain and pelvic inflammation given symptoms >10 years, also with chlamydia positive in 2012, however given no significant risk factors now mutual decision to not obtain GC/Chlamydia or other STD screening today.  Plan: 1. Wet prep - showed no clue cells (whiff neg), no yeast, no  trich. Significant for >20 WBC and many bacteria TNTC 2. Trial on Doxycycline 100 BID x 7 days for empiric coverage of cervicitis / chronic PID and pelvic anti-inflammatory properties, after wet prep re-discussed results and offered repeat pelvic with GC/Chlamydia swab but again mutual decision that this is less likely, and concern she may have chronic inflammation causing WBC 3. Also given rx Diflucan empiric if yeast infection after antibiotics 4. Similar to previous office visit 03/2015, advised her that she would likely need GYN referral in future if still persistent 5. Follow-up with PCP to see if discharge / pelvic discomfort resolved, and discuss future GYN referral, may consider repeat full STD screening at that time if still symptomatic or new exposures   Meds ordered this encounter  Medications  . doxycycline (VIBRAMYCIN) 100 MG capsule    Sig: Take 1 capsule (100 mg total) by mouth 2 (two) times daily. For 7 days    Dispense:  14 capsule    Refill:  0  . fluconazole (DIFLUCAN) 150 MG tablet    Sig: Take one tablet by mouth on Day 1. Repeat dose 2nd tablet on Day 3.    Dispense:  2 tablet    Refill:  0      Follow up plan: Return in about 4 weeks (around 09/23/2015) for vaginal discharge, PID.  Saralyn PilarAlexander Karamalegos, DO [facility], PGY-3

## 2015-08-26 NOTE — Patient Instructions (Signed)
Thank you for coming in to clinic today.  1. Your wet prep test did not show BV or Yeast or Trichomonas It did show significant inflammation with WBC I recommend trial of Doxycycline 1 twice a day for 7 days, this is a strong anti inflammatory and would provide coverage for chlamydia which is related to PID  If you develop yeast infection, recommend taking the Diflucan  This problem can be recurrent. Some people are more likely to get it than others, and it has to do with normal body chemistry and vaginal environment. One of the biggest risk factors for causing BV is unprotected sexual intercourse (without a condom), because semen can change the chemistry of your vaginal environment, causing the "good bacteria" reduce and the other bacteria to grow and cause your symptoms. Recommend to start using condoms with intercourse to see if this helps reduce your symptoms, do this especially for next 1 week while on treatment. Some other recommendations include taking a daily probiotic and yogurt can help reduce BV as well, this is not proven to work in all patients.  Please schedule a follow-up appointment with Dr Wende MottMcKeag (new primary doctor) within 2-4 weeks to discuss future Referral to GYN to discuss chronic discharge and prior PID  If you have any other questions or concerns, please feel free to call the clinic to contact me. You may also schedule an earlier appointment if necessary.  However, if your symptoms get significantly worse, please go to the Emergency Department to seek immediate medical attention.  Saralyn PilarAlexander Karamalegos, DO [facility]

## 2015-09-29 ENCOUNTER — Ambulatory Visit: Payer: Medicaid Other | Admitting: Family Medicine

## 2015-10-07 ENCOUNTER — Other Ambulatory Visit (HOSPITAL_COMMUNITY)
Admission: RE | Admit: 2015-10-07 | Discharge: 2015-10-07 | Disposition: A | Discharge status: Home / Self Care | Payer: Medicaid Other | Source: Ambulatory Visit | Attending: Family Medicine | Admitting: Family Medicine

## 2015-10-07 ENCOUNTER — Encounter: Payer: Self-pay | Admitting: Internal Medicine

## 2015-10-07 ENCOUNTER — Ambulatory Visit (INDEPENDENT_AMBULATORY_CARE_PROVIDER_SITE_OTHER): Payer: Self-pay | Admitting: Internal Medicine

## 2015-10-07 VITALS — BP 131/60 | HR 116 | Temp 98.5°F | Wt 151.8 lb

## 2015-10-07 DIAGNOSIS — Z3042 Encounter for surveillance of injectable contraceptive: Secondary | ICD-10-CM

## 2015-10-07 DIAGNOSIS — N898 Other specified noninflammatory disorders of vagina: Secondary | ICD-10-CM

## 2015-10-07 DIAGNOSIS — Z113 Encounter for screening for infections with a predominantly sexual mode of transmission: Secondary | ICD-10-CM | POA: Insufficient documentation

## 2015-10-07 LAB — POCT WET PREP (WET MOUNT): CLUE CELLS WET PREP WHIFF POC: POSITIVE

## 2015-10-07 MED ORDER — DOXYCYCLINE HYCLATE 100 MG PO CAPS: 100.0000 mg | ORAL_CAPSULE | Freq: Two times a day (BID) | ORAL | Status: DC

## 2015-10-07 MED ORDER — METRONIDAZOLE 500 MG PO TABS: 500.0000 mg | ORAL_TABLET | Freq: Two times a day (BID) | ORAL | Status: DC

## 2015-10-07 MED ORDER — CEFTRIAXONE SODIUM 1 G IJ SOLR
250.0000 mg | Freq: Once | INTRAMUSCULAR | Status: AC
  Administered 2015-10-07: 250 mg via INTRAMUSCULAR

## 2015-10-07 MED ORDER — FLUCONAZOLE 150 MG PO TABS: ORAL_TABLET | ORAL | Status: DC

## 2015-10-07 MED ORDER — MEDROXYPROGESTERONE ACETATE 150 MG/ML IM SUSP
150.0000 mg | Freq: Once | INTRAMUSCULAR | Status: AC
  Administered 2015-10-07: 150 mg via INTRAMUSCULAR

## 2015-10-07 NOTE — Patient Instructions (Addendum)
It was nice meeting you today Ms. Waldo LaineWare!  Please take metronidazole twice a day for the next week, and doxycycline twice a day for the next two weeks. You can also take the diflucan if you develop a yeast infection.   If you have any questions or concerns, please feel free to call the clinic.   Be well,  Dr. Natale MilchLancaster

## 2015-10-07 NOTE — Progress Notes (Signed)
   Subjective:    Patient ID: Suzanne Ellis, female    DOB: 10/26/1985, 30 y.o.   MRN: [id number]  HPI  Patient presents for vaginal discharge.   Vaginal discharge Diagnosed with PID as teenager after diagnosis of chlamydia. Was hospitalized for treatment at that time. Has continued to have discharge and recurrent BV and yeast infections since, but says she has never received any further specific PID treatment. Was treated with one week of doxycycline as well as diflucan 6 weeks ago for discharge at that time, however reports that symptoms quickly returned as soon as she finished the medication course. Is now reporting thick white vaginal discharge with a foul odor. Also endorses vaginal irritation. Denies dysuria, hematuria, or bloody discharge. Denies sexual intercourse for many months. Is on Depo for contraception.    Review of Systems See HPI.     Objective:   Physical Exam  Constitutional: She is oriented to person, place, and time. She appears well-developed and well-nourished.  HENT:  Head: Normocephalic and atraumatic.  Genitourinary: Vaginal discharge (Thick white) found.  Positive cervical motion tenderness  Neurological: She is alert and oriented to person, place, and time.  Psychiatric: She has a normal mood and affect. Her behavior is normal.      Assessment & Plan:  Vaginal discharge Likely 2/2 BV, as wet prep with clue cells and positive whiff test. However, patient with severe cervical motion tenderness on cervical exam and history of PID. Screened for GC/chlamydia today despite no reports of sexual intercourse.  - F/u GC/chlamydia - Metronidazole for BV - Diflucan if develops yeast infection after abx - Will treat for possible PID flare at this time with doxycycline BID x 14d and 250mg  Rocephin in office. If symptoms persist despite this treatment, will refer to GYN for second opinion.    Tarri AbernethyAbigail J Davyn Morandi, MD, MPH PGY-2 [facility] Pager  [phone]

## 2015-10-07 NOTE — Assessment & Plan Note (Signed)
Likely 2/2 BV, as wet prep with clue cells and positive whiff test. However, patient with severe cervical motion tenderness on cervical exam and history of PID. Screened for GC/chlamydia today despite no reports of sexual intercourse.  - F/u GC/chlamydia - Metronidazole for BV - Diflucan if develops yeast infection after abx - Will treat for possible PID flare at this time with doxycycline BID x 14d and 250mg  Rocephin in office. If symptoms persist despite this treatment, will refer to GYN for second opinion.

## 2015-10-10 LAB — CERVICOVAGINAL ANCILLARY ONLY
Chlamydia: NEGATIVE
NEISSERIA GONORRHEA: NEGATIVE

## 2015-11-17 ENCOUNTER — Ambulatory Visit: Payer: Medicaid Other | Admitting: Family Medicine

## 2015-12-02 ENCOUNTER — Telehealth: Payer: Self-pay | Admitting: Family Medicine

## 2015-12-02 NOTE — Telephone Encounter (Signed)
Pt suffers from migranes.  She had a severe headache behind her left eye.  It caused her to be nauseous, left arm and hand felt fuzzy and numb.  She took excedrin. The pain has lessened and the hand and arm are back to normal. Pt was scared by this because it has never happened before. Please advise

## 2015-12-02 NOTE — Telephone Encounter (Signed)
If patient has this again I would recommend she get checked out while having these symptoms. In the mean time it would probably be ideal to have her come in to discuss these HAs at her convenience.

## 2015-12-06 NOTE — Telephone Encounter (Signed)
Pt scheduled for a appt tomorrow. Suzanne Ellis, CMA

## 2015-12-07 ENCOUNTER — Encounter: Payer: Self-pay | Admitting: Family Medicine

## 2015-12-07 ENCOUNTER — Ambulatory Visit (INDEPENDENT_AMBULATORY_CARE_PROVIDER_SITE_OTHER): Payer: Self-pay | Admitting: Family Medicine

## 2015-12-07 VITALS — BP 122/69 | HR 96 | Temp 98.2°F | Wt 151.2 lb

## 2015-12-07 DIAGNOSIS — G43101 Migraine with aura, not intractable, with status migrainosus: Secondary | ICD-10-CM

## 2015-12-07 DIAGNOSIS — R531 Weakness: Secondary | ICD-10-CM

## 2015-12-07 DIAGNOSIS — M6289 Other specified disorders of muscle: Secondary | ICD-10-CM

## 2015-12-07 DIAGNOSIS — G43109 Migraine with aura, not intractable, without status migrainosus: Secondary | ICD-10-CM | POA: Insufficient documentation

## 2015-12-07 MED ORDER — NAPROXEN 250 MG PO TABS: ORAL_TABLET | ORAL | 3 refills | Status: DC

## 2015-12-07 NOTE — Progress Notes (Signed)
   HPI  CC:  HA behind right eye. Made weak, n/v, some numbness/parethesias on Lt side.  9/15; lasted 3 days. Took Excedrin -- helped but still present. Still having some "fullness" behind the left eye.   No family hx of MS or other autoimmune.   Associated Symptoms Nausea/vomiting: yes, x1  Photophobia/phonophobia: yes, only photophobia  Tearing of eyes: no  Sinus pain/pressure: no  Family hx migraine: no  Personal stressors: yes  Relation to menstrual cycle: no   Red Flags Fever: no  Neck pain/stiffness: no  Vision/speech/swallow/hearing difficulty: no  Focal weakness/numbness: yes  Altered mental status: no  Trauma: no  New type of headache: no  Anticoagulant use: no  H/o cancer/HIV/Pregnancy: no   CC, SH/smoking status, and VS noted  Objective: BP 122/69   Pulse 96   Temp 98.2 F (36.8 C) (Oral)   Wt 151 lb 3.2 oz (68.6 kg)   BMI 25.16 kg/m  Gen: NAD, alert, cooperative, and pleasant. HEENT: NCAT, EOMI, PERRL, visual fields intact bilaterally, normal limited bilateral funduscopic exam. No scleral injection. Neck FROM. CV: RRR, no murmur Resp: CTAB, no wheezes, non-labored Abd: SNTND, BS present, no guarding or organomegaly Ext: No edema, warm Neuro: Alert and oriented, Speech clear, CN II-XII intact, DTRs +2 bilat  Assessment and plan:  Migraine with aura Patient is presenting with a history of symptoms consistent with a migraine with aura, and possible status migrainosus with a duration approximately 72 hours long. No focal deficits on exam today. This seems to be a relatively new or worsening symptom presentation. Patient would like to have this worked up. Differential does include stroke/TIA and multiple sclerosis. Patient has minimal risk factors for TIA and no known family history of rheumatologic conditions. - MRI with and without contrast. - Naproxen 750 mg as needed upon migraine onset. Then 500 mg 12 hours later if symptoms worsen. - will  f/u   Orders Placed This Encounter  Procedures  . MR Brain W Wo Contrast    Standing Status:   Future    Standing Expiration Date:   02/05/2017    Order Specific Question:   If indicated for the ordered procedure, I authorize the administration of contrast media per Radiology protocol    Answer:   Yes    Order Specific Question:   Reason for Exam (SYMPTOM  OR DIAGNOSIS REQUIRED)    Answer:   left sided weakness    Order Specific Question:   Preferred imaging location?    Answer[facility] (table limit-500 lbs)    Order Specific Question:   What is the patient's sedation requirement?    Answer:   No Sedation    Order Specific Question:   Does the patient have a pacemaker or implanted devices?    Answer:   No    Meds ordered this encounter  Medications  . naproxen (NAPROSYN) 250 MG tablet    Sig: Take 3 tablets (750 mg) as needed at migraine onset. Take 2 tablets (500 mg) 12 hrs later as needed. Do not exceed 1250 mg in 24 hrs.    Dispense:  30 tablet    Refill:  3     Kathee DeltonIan D McKeag, MD,MS,  PGY3 12/07/2015 6:39 PM

## 2015-12-07 NOTE — Patient Instructions (Signed)

## 2015-12-07 NOTE — Assessment & Plan Note (Signed)
Patient is presenting with a history of symptoms consistent with a migraine with aura, and possible status migrainosus with a duration approximately 72 hours long. No focal deficits on exam today. This seems to be a relatively new or worsening symptom presentation. Patient would like to have this worked up. Differential does include stroke/TIA and multiple sclerosis. Patient has minimal risk factors for TIA and no known family history of rheumatologic conditions. - MRI with and without contrast. - Naproxen 750 mg as needed upon migraine onset. Then 500 mg 12 hours later if symptoms worsen. - will f/u

## 2015-12-14 ENCOUNTER — Ambulatory Visit (HOSPITAL_COMMUNITY)
Admission: RE | Admit: 2015-12-14 | Discharge: 2015-12-14 | Disposition: A | Discharge status: Home / Self Care | Payer: Medicaid Other | Source: Ambulatory Visit | Attending: Family Medicine | Admitting: Family Medicine

## 2015-12-14 DIAGNOSIS — M6281 Muscle weakness (generalized): Secondary | ICD-10-CM | POA: Insufficient documentation

## 2015-12-14 DIAGNOSIS — R531 Weakness: Secondary | ICD-10-CM

## 2015-12-14 MED ORDER — GADOBENATE DIMEGLUMINE 529 MG/ML IV SOLN
15.0000 mL | Freq: Once | INTRAVENOUS | Status: AC | PRN
Start: 2015-12-14 — End: 2015-12-14
  Administered 2015-12-14: 15 mL via INTRAVENOUS

## 2015-12-16 ENCOUNTER — Telehealth: Payer: Self-pay | Admitting: Family Medicine

## 2015-12-16 NOTE — Telephone Encounter (Signed)
Pt is calling and would like to know what her MRI results were. She said there have also been changes in her situation. She has some questions also bout her last visit.jw

## 2015-12-19 ENCOUNTER — Telehealth: Payer: Self-pay | Admitting: Family Medicine

## 2015-12-19 DIAGNOSIS — R531 Weakness: Secondary | ICD-10-CM

## 2015-12-19 DIAGNOSIS — G4452 New daily persistent headache (NDPH): Secondary | ICD-10-CM

## 2015-12-19 NOTE — Telephone Encounter (Signed)
Called patient to discuss the MRI results. Patient endorses persistent headache with waxing and waning of symptoms. She continues to have some occasional onset of numbness and weakness. No significant improvement of late.  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 Referral will be placed today. Patient was informed and was very thankful for the information provided today.  Kathee DeltonIan D Vertis Bauder, MD,MS,  PGY3 12/19/2015 9:17 PM

## 2015-12-19 NOTE — Telephone Encounter (Signed)
Pt called again wanting to speak with Dr. Wende MottMcKeag about her MRI results. Pt states she had headaches all last week and to please call her as soon as possible. Please advise. Thanks! ep

## 2015-12-29 ENCOUNTER — Telehealth: Payer: Self-pay | Admitting: Family Medicine

## 2015-12-29 ENCOUNTER — Encounter: Payer: Self-pay | Admitting: Neurology

## 2015-12-29 ENCOUNTER — Ambulatory Visit (INDEPENDENT_AMBULATORY_CARE_PROVIDER_SITE_OTHER): Payer: Self-pay | Admitting: Neurology

## 2015-12-29 ENCOUNTER — Telehealth: Payer: Self-pay | Admitting: Neurology

## 2015-12-29 VITALS — BP 123/72 | HR 96 | Ht 65.0 in | Wt 153.0 lb

## 2015-12-29 DIAGNOSIS — G935 Compression of brain: Secondary | ICD-10-CM

## 2015-12-29 DIAGNOSIS — G43119 Migraine with aura, intractable, without status migrainosus: Secondary | ICD-10-CM

## 2015-12-29 HISTORY — DX: Compression of brain: G93.5

## 2015-12-29 MED ORDER — TOPIRAMATE 25 MG PO TABS: ORAL_TABLET | ORAL | 3 refills | Status: DC

## 2015-12-29 MED ORDER — BUTALBITAL-APAP-CAFFEINE 50-325-40 MG PO TABS: 1.0000 | ORAL_TABLET | Freq: Four times a day (QID) | ORAL | 3 refills | Status: DC | PRN

## 2015-12-29 NOTE — Telephone Encounter (Signed)
I saw the note regarding this patient, I called the patient to find out what the concerns were that were not addressed during the appointment today. 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 I discussed this issue with her, showed her what this was all about by pointing to a brain anatomy map on the wall of the exam room. I indicated that I did not believe this was the source of her headaches, migraine headaches have been present since age 30, and are likely inherited. I prescribed medications for her headache.   When I called the patient this evening, she answered rectally, but then hung up on me and would not answer any return calls.  The patient is now acting in a very unusual fashion, I am not sure what is going on, the interaction in the office today was quite pleasant, I felt that it was quite informative for her and she had a clear understanding of what the treatment plan was upon departure.  I am still not sure what the concerns that the patient had come into the office. I would like some feedback, and apparently I will not get it directly from the patient.

## 2015-12-29 NOTE — Telephone Encounter (Signed)
Pt called in with a concern about her appt today. She felt very rushed and was not comfortable about the visit. She states the main reason she came in was not addressed or resolved. She states the physician was very nice but does not feel the visit really addressed anything. She states no testing was ordered and medication was prescribed. She saw her pcp who told her to come see a neurologist but the reason for her referral was never touched upon. Pt has severe migraines and has since a teenager but does not know why. Pt says if something was on her MRI , pcp ordered then it should have been talked about. She would like to speak with the manager about the visit. Please call [phone]

## 2015-12-29 NOTE — Telephone Encounter (Signed)
Pt dropped off [employer] forms on Monday and they have to be turned in tomorrow. Pt needs to pick them up at the latest tomorrow morning.   Pt also has some concerns about the neurologist she was referred to. Pt feels the doctor did not listen to her concerns or explain things so the pt could understand. Pt will try to see if she can get in with another provider. If you have any other advise please let pt know. Thanks! ep

## 2015-12-29 NOTE — Patient Instructions (Addendum)
 Topamax (topiramate) is a seizure medication that has an FDA approval for seizures and for migraine headache. Potential side effects of this medication include weight loss, cognitive slowing, tingling in the fingers and toes, and carbonated drinks will taste bad. If any significant side effects are noted on this drug, please contact our office.  Migraine Headache A migraine headache is an intense, throbbing pain on one or both sides of your head. A migraine can last for 30 minutes to several hours. CAUSES  The exact cause of a migraine headache is not always known. However, a migraine may be caused when nerves in the brain become irritated and release chemicals that cause inflammation. This causes pain. Certain things may also trigger migraines, such as:  Alcohol.  Smoking.  Stress.  Menstruation.  Aged cheeses.  Foods or drinks that contain nitrates, glutamate, aspartame, or tyramine.  Lack of sleep.  Chocolate.  Caffeine.  Hunger.  Physical exertion.  Fatigue.  Medicines used to treat chest pain (nitroglycerine), birth control pills, estrogen, and some blood pressure medicines. SIGNS AND SYMPTOMS  Pain on one or both sides of your head.  Pulsating or throbbing pain.  Severe pain that prevents daily activities.  Pain that is aggravated by any physical activity.  Nausea, vomiting, or both.  Dizziness.  Pain with exposure to bright lights, loud noises, or activity.  General sensitivity to bright lights, loud noises, or smells. Before you get a migraine, you may get warning signs that a migraine is coming (aura). An aura may include:  Seeing flashing lights.  Seeing bright spots, halos, or zigzag lines.  Having tunnel vision or blurred vision.  Having feelings of numbness or tingling.  Having trouble talking.  Having muscle weakness. DIAGNOSIS  A migraine headache is often diagnosed based on:  Symptoms.  Physical exam.  A CT scan or MRI of your  head. These imaging tests cannot diagnose migraines, but they can help rule out other causes of headaches. TREATMENT Medicines may be given for pain and nausea. Medicines can also be given to help prevent recurrent migraines.  HOME CARE INSTRUCTIONS  Only take over-the-counter or prescription medicines for pain or discomfort as directed by your health care provider. The use of long-term narcotics is not recommended.  Lie down in a dark, quiet room when you have a migraine.  Keep a journal to find out what may trigger your migraine headaches. For example, write down:  What you eat and drink.  How much sleep you get.  Any change to your diet or medicines.  Limit alcohol consumption.  Quit smoking if you smoke.  Get 7-9 hours of sleep, or as recommended by your health care provider.  Limit stress.  Keep lights dim if bright lights bother you and make your migraines worse. SEEK IMMEDIATE MEDICAL CARE IF:   Your migraine becomes severe.  You have a fever.  You have a stiff neck.  You have vision loss.  You have muscular weakness or loss of muscle control.  You start losing your balance or have trouble walking.  You feel faint or pass out.  You have severe symptoms that are different from your first symptoms. MAKE SURE YOU:   Understand these instructions.  Will watch your condition.  Will get help right away if you are not doing well or get worse.   This information is not intended to replace advice given to you by your health care provider. Make sure you discuss any questions you have with your   health care provider.   Document Released: 03/05/2005 Document Revised: 03/26/2014 Document Reviewed: 11/10/2012 Elsevier Interactive Patient Education 2016 Elsevier Inc.  

## 2015-12-29 NOTE — Progress Notes (Signed)
Reason for visit: Migraine headache  Referring physician: Dr. Lloyd HugerMcKeag  Suzanne Ellis is a 30 y.o. female  History of present illness:  Suzanne Ellis is a 30 year old right-handed black female with a history of migraine headaches with neurologic features. The patient indicates that her headaches will usually begin behind one eye or the other, the headache may then spread around the head to the occipital area. The patient may have nausea and vomiting, photophobia and phonophobia. The patient oftentimes will have numbness and tingling sensations on one arm or the other, she may have some weakness associated with this, she may have some involvement in the leg as well. The patient has some chronic neck and shoulder discomfort. MRI of the brain has been done, shows a Arnold-Chiari type I with a 12 mm projection of the cerebellar tonsils downward. No compression of the brainstem or spinal cord was noted. The patient denies any balance problems, she denies any problems controlling the bowels or the bladder. She has not had any blackout episodes with exception of one event that occurred following a bump to the head many years ago. The patient will take about 800 mg of ibuprofen at the onset of the headache with some benefit, but it does not eliminate the headache. The headache may last anywhere from 2 hours to all day long. She may miss work because of the headache. The headaches have become more frequent over the last 3 months going from twice a month to 3 or 4 times a week. The patient does not know of any particular activating factors for the headache. She is sent to this office for an evaluation.  Past Medical History:  Diagnosis Date  . CHLAMYDTRACHOMATIS INFECTION LOWER GU SITES 07/28/2008   Qualifier: Diagnosis of  By: Jake Sharkhaney RN, Amy    . No pertinent past medical history     Past Surgical History:  Procedure Laterality Date  . ARM WOUND REPAIR / CLOSURE    . DILATION AND CURETTAGE OF UTERUS    .  termination    . WISDOM TOOTH EXTRACTION      Family History  Problem Relation Age of Onset  . Hypertension Mother   . Cancer Mother     lung  . Anesthesia problems Other     MGF allergic to a common type of anesthesia  . Cancer Maternal Grandmother     skin  . Diabetes Maternal Grandmother   . Hypotension Neg Hx   . Malignant hyperthermia Neg Hx   . Pseudochol deficiency Neg Hx     Social history:  reports that she has been smoking Cigarettes.  She has a 5.00 pack-year smoking history. She has never used smokeless tobacco. She reports that she drinks alcohol. She reports that she does not use drugs.  Medications:  Prior to Admission medications   Medication Sig Start Date End Date Taking? Authorizing Provider  ibuprofen (ADVIL,MOTRIN) 200 MG tablet Take 400 mg by mouth every 6 (six) hours as needed for moderate pain.    Yes Historical Provider, MD  medroxyPROGESTERone (DEPO-PROVERA) 150 MG/ML injection Inject 150 mg into the muscle every 3 (three) months.   Yes Historical Provider, MD  Multiple Vitamin (MULTIVITAMIN) tablet Take 1 tablet by mouth daily.   Yes Historical Provider, MD      Allergies  Allergen Reactions  . Lamictal [Lamotrigine] Rash    ROS:  Out of a complete 14 system review of symptoms, the patient complains only of the following symptoms, and  all other reviewed systems are negative.  Fatigue Blurred vision, eye pain Cough, snoring Anemia, easy bruising Feeling cold Joint pain, muscle cramps, aching muscles Headache, numbness, weakness Depression, anxiety, too much sleep, not enough sleep, decreased energy Insomnia, sleepiness  Blood pressure 123/72, pulse 96, height 5\' 5"  (1.651 m), weight 153 lb (69.4 kg).  Physical Exam  General: The patient is alert and cooperative at the time of the examination.  Eyes: Pupils are equal, round, and reactive to light. Discs are flat bilaterally.  Neck: The neck is supple, no carotid bruits are  noted.  Respiratory: The respiratory examination is clear.  Cardiovascular: The cardiovascular examination reveals a regular rate and rhythm, no obvious murmurs or rubs are noted.  Skin: Extremities are without significant edema.  Neurologic Exam  Mental status: The patient is alert and oriented x 3 at the time of the examination. The patient has apparent normal recent and remote memory, with an apparently normal attention span and concentration ability.  Cranial nerves: Facial symmetry is present. There is good sensation of the face to pinprick and soft touch bilaterally. The strength of the facial muscles and the muscles to head turning and shoulder shrug are normal bilaterally. Speech is well enunciated, no aphasia or dysarthria is noted. Extraocular movements are full. Visual fields are full. The tongue is midline, and the patient has symmetric elevation of the soft palate. No obvious hearing deficits are noted.  Motor: The motor testing reveals 5 over 5 strength of all 4 extremities. Good symmetric motor tone is noted throughout.  Sensory: Sensory testing is intact to pinprick, soft touch, vibration sensation, change and position sense on all 4 extremities. No evidence of extinction is noted.  Coordination: Cerebellar testing reveals good finger-nose-finger and heel-to-shin bilaterally.  Gait and station: Gait is normal. Tandem gait is normal. Romberg is negative. No drift is seen.  Reflexes: Deep tendon reflexes are symmetric and normal bilaterally. Toes are downgoing bilaterally.   MRI brain 12/14/15:  IMPRESSION: 1. No explanation for left-sided weakness.  No white matter disease. 2. Low cerebellar tonsils (12 mm below the foramen magnum) without notable foramen magnum stenosis. Correlate for Chiari 1 symptoms.  * MRI scan images were reviewed online. I agree with the written report.    Assessment/Plan:  1. Migraine headache with neurologic features  2. Arnold-Chiari  type I  The patient has had a significant exacerbation in headache frequency over the last 3 months. She will be placed on Topamax. A prescription was given for Fioricet. 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 She will follow-up in 3 months. She will contact us if there are side effects to the medications.  Marlan Palau MD 12/29/2015 8:28 AM  [facility] [address] [place], [place] [address]  Phone [phone] Fax [phone]

## 2015-12-30 NOTE — Telephone Encounter (Signed)
Unfortunately [facility]. No forms of hers were in my inbox last I checked on Tuesday. I doubt I'll have any opportunity to fill these out in time. Assuming that to be the case; I will have them complete on Monday. Page me if there is any issue [phone]

## 2015-12-30 NOTE — Telephone Encounter (Signed)
FYI to MD

## 2015-12-30 NOTE — Telephone Encounter (Signed)
Forms completed and left up front. Patient has already been contacted to come pick this up today if time permits.

## 2015-12-30 NOTE — Telephone Encounter (Signed)
I called the patient. No answer left VM. Will try again later.

## 2015-12-30 NOTE — Telephone Encounter (Signed)
Pt called again regarding forms. Pt was informed. Pt will call back if she needs a note stating why they were not able to filled out by deadline. Please call pt when forms are ready to be picked up on Monday. Please advise. Thanks! ep

## 2016-01-02 NOTE — Telephone Encounter (Signed)
I called the patient. We  reviewed her diagnosis. I explained Arnold-Chiari malformation type I in great detail. The patient states in the past she's had 2 family members that was misdiagnosed and that resulted in death. For that reason she is a little concerned about her diagnosis. After explaining migraine headaches and Arnold-Chiari malformation--the patient does feel reassured. She is taking Topamax and was able to go all weekend without having a headache. She plans to follow-up in 3 months. Advised to call if she has any additional questions.

## 2016-01-04 ENCOUNTER — Ambulatory Visit (INDEPENDENT_AMBULATORY_CARE_PROVIDER_SITE_OTHER): Payer: Medicaid Other | Admitting: *Deleted

## 2016-01-04 DIAGNOSIS — Z3042 Encounter for surveillance of injectable contraceptive: Secondary | ICD-10-CM | POA: Diagnosis present

## 2016-01-04 MED ORDER — MEDROXYPROGESTERONE ACETATE 150 MG/ML IM SUSP
150.0000 mg | Freq: Once | INTRAMUSCULAR | Status: AC
  Administered 2016-01-04: 150 mg via INTRAMUSCULAR

## 2016-01-04 NOTE — Progress Notes (Signed)
   Pt in for Depo Provera injection.  Pt tolerated Depo injection. Depo given left upper outer quadrant.  Next injection due Jan. 3-17, 2018.  Reminder card given. Clovis PuMartin, Tamika L, RN

## 2016-01-12 ENCOUNTER — Ambulatory Visit (INDEPENDENT_AMBULATORY_CARE_PROVIDER_SITE_OTHER): Payer: Self-pay | Admitting: Family Medicine

## 2016-01-12 ENCOUNTER — Encounter: Payer: Self-pay | Admitting: Family Medicine

## 2016-01-12 VITALS — BP 123/71 | HR 94 | Temp 99.0°F | Ht 65.0 in | Wt 155.2 lb

## 2016-01-12 DIAGNOSIS — N898 Other specified noninflammatory disorders of vagina: Secondary | ICD-10-CM

## 2016-01-12 LAB — POCT WET PREP (WET MOUNT)
CLUE CELLS WET PREP WHIFF POC: POSITIVE
TRICHOMONAS WET PREP HPF POC: ABSENT

## 2016-01-12 MED ORDER — TOPIRAMATE 25 MG PO TABS: ORAL_TABLET | ORAL | 3 refills | Status: DC

## 2016-01-12 MED ORDER — METRONIDAZOLE 500 MG PO TABS: 500.0000 mg | ORAL_TABLET | Freq: Two times a day (BID) | ORAL | 0 refills | Status: DC

## 2016-01-12 MED ORDER — FLUCONAZOLE 150 MG PO TABS: 150.0000 mg | ORAL_TABLET | Freq: Once | ORAL | 0 refills | Status: AC

## 2016-01-12 NOTE — Progress Notes (Signed)
VAGINAL DISCHARGE Patient is here with concerns of possible "bacterial" vaginal infection. She states she has been having discharge over the past 3-4 days. Vaginal itching. No pain or vaginal bleeding. No abdominal or pelvic pain. No fevers no chills.  Having vaginal discharge for 4 days. Discharge is: White and malodorous  Sex in last month: No Using barrier protection (condoms): Yes Possible STD exposure: No Personal history of vaginal infection: Yes Family history of uterine or vaginal cancer: No Recent antibiotic use: No  Symptoms Vaginal itching: yes Dysuria:no Dyspareunia: no Genital sores or ulcers:no Hematuria: no Flank pain: no Weight loss: no Weight gain: no Trouble with vision: no Headaches: yes, but at baseline Abdomen or pelvic pain: no Back pain: no   ROS see HPI Smoking Status noted  CC, SH/smoking status, and VS noted  Objective: BP 123/71 (BP Location: Left Arm, Patient Position: Sitting, Cuff Size: Normal)   Pulse 94   Temp 99 F (37.2 C) (Oral)   Ht 5\' 5"  (1.651 m)   Wt 155 lb 3.2 oz (70.4 kg)   BMI 25.83 kg/m  Gen: NAD, alert, cooperative. CV: Well-perfused. Resp: Non-labored. Neuro: Sensation intact throughout.  Assessment and plan:  Vaginal discharge Patient is here with complaints of vaginal discharge. No new sexual partners or risky sexual behavior. Wet prep collected by "self collection" yielded clue cells and bacteria assistant with BV. - Metronidazole twice a day 7 days - Diflucan 1. Patient advised not to fill this prescription unless she develops symptoms of yeast infection.   Orders Placed This Encounter  Procedures  . POCT Wet Prep [facility] [place])    Meds ordered this encounter  Medications  . metroNIDAZOLE (FLAGYL) 500 MG tablet    Sig: Take 1 tablet (500 mg total) by mouth 2 (two) times daily.    Dispense:  14 tablet    Refill:  0  . fluconazole (DIFLUCAN) 150 MG tablet    Sig: Take 1 tablet (150 mg total) by mouth once.     Dispense:  1 tablet    Refill:  0  . topiramate (TOPAMAX) 25 MG tablet    Sig: Take one tablet at night for one week, then take 2 tablets at night for one week, then take 3 tablets at night.    Dispense:  90 tablet    Refill:  3     Kathee DeltonIan D McKeag, MD,MS,  PGY3 01/12/2016 7:40 PM

## 2016-01-12 NOTE — Patient Instructions (Signed)

## 2016-01-12 NOTE — Assessment & Plan Note (Signed)
Patient is here with complaints of vaginal discharge. No new sexual partners or risky sexual behavior. Wet prep collected by "self collection" yielded clue cells and bacteria assistant with BV. - Metronidazole twice a day 7 days - Diflucan 1. Patient advised not to fill this prescription unless she develops symptoms of yeast infection.

## 2016-02-23 ENCOUNTER — Other Ambulatory Visit: Payer: Self-pay | Admitting: Family Medicine

## 2016-02-23 MED ORDER — METRONIDAZOLE 500 MG PO TABS: 500.0000 mg | ORAL_TABLET | Freq: Two times a day (BID) | ORAL | 0 refills | Status: DC

## 2016-02-23 NOTE — Telephone Encounter (Signed)
Called patient. Discussed her symptoms. Symptoms appear to be significantly similar to the previous visit in which she was diagnosed with BV. At this time patient has had BV multiple times and I trust her ability to relate this information accurately. I will treat at this time with metronidazole. I informed her that no prescription for Diflucan will be provided to "protect against" an antibiotic associated use infection, and she would need an appointment for that medication. Patient stated her understanding.  Recommended yogurts, cottage cheese, or other probiotic while on antibiotic to help prevent yeast infection.

## 2016-02-23 NOTE — Telephone Encounter (Signed)
Will forward to PCP.  Martin, Tamika L, RN  

## 2016-02-23 NOTE — Telephone Encounter (Signed)
Pt has another bacteria infection. Pt states she gets these all the time and has dicussed this with PCP. Pt has no insurance and would like PCP to call in something for this. Pt understands doctors don't normally do this, but she is in need. Pt would like PCP to give her a call.Please advise. Thanks! ep

## 2016-02-24 MED ORDER — METRONIDAZOLE 500 MG PO TABS: 500.0000 mg | ORAL_TABLET | Freq: Two times a day (BID) | ORAL | 0 refills | Status: DC

## 2016-02-24 NOTE — Telephone Encounter (Signed)
Pt states [facility] does not have medication. Pt uses [facility] on [address]. Please advise. Thanks! ep

## 2016-02-24 NOTE — Addendum Note (Signed)
Addended by: Clovis PuMARTIN, TAMIKA L on: 02/24/2016 01:33 PM   Modules accepted: Orders

## 2016-02-24 NOTE — Telephone Encounter (Signed)
Medication resent to correct pharmacy.  Clovis PuMartin, Orphia Mctigue L, RN

## 2016-04-03 ENCOUNTER — Ambulatory Visit (INDEPENDENT_AMBULATORY_CARE_PROVIDER_SITE_OTHER): Payer: Medicaid Other | Admitting: *Deleted

## 2016-04-03 DIAGNOSIS — Z3042 Encounter for surveillance of injectable contraceptive: Secondary | ICD-10-CM

## 2016-04-03 DIAGNOSIS — Z3040 Encounter for surveillance of contraceptives, unspecified: Secondary | ICD-10-CM | POA: Diagnosis not present

## 2016-04-03 MED ORDER — MEDROXYPROGESTERONE ACETATE 150 MG/ML IM SUSP
150.0000 mg | Freq: Once | INTRAMUSCULAR | Status: AC
  Administered 2016-04-03: 150 mg via INTRAMUSCULAR

## 2016-04-03 NOTE — Progress Notes (Signed)
   Pt in for Depo Provera injection.  Pt tolerated Depo injection. Depo given right upper outer quadrant.  Next injection due April 3-17, 2018.  Reminder card given. Clovis PuMartin, Tamika L, RN

## 2016-07-03 ENCOUNTER — Ambulatory Visit: Payer: Medicaid Other

## 2016-07-04 ENCOUNTER — Ambulatory Visit: Payer: Medicaid Other

## 2016-07-05 ENCOUNTER — Ambulatory Visit (INDEPENDENT_AMBULATORY_CARE_PROVIDER_SITE_OTHER): Payer: Medicaid Other | Admitting: *Deleted

## 2016-07-05 DIAGNOSIS — Z3042 Encounter for surveillance of injectable contraceptive: Secondary | ICD-10-CM | POA: Diagnosis present

## 2016-07-05 LAB — POCT URINE PREGNANCY: Preg Test, Ur: NEGATIVE

## 2016-07-05 MED ORDER — MEDROXYPROGESTERONE ACETATE 150 MG/ML IM SUSP
150.0000 mg | Freq: Once | INTRAMUSCULAR | Status: AC
  Administered 2016-07-05: 150 mg via INTRAMUSCULAR

## 2016-07-05 NOTE — Progress Notes (Signed)
   Pt two days late for Depo Provera injection.  Pregnancy test ordered; result negative . Advised patient to used another reliable form of birth control and repeat pregnancy test in 2 weeks. Pt tolerated Depo injection. Depo given left upper outer quadrant.  Next injection due July 5-19, 2018.  Reminder card given. Clovis Pu, RN

## 2016-09-25 ENCOUNTER — Ambulatory Visit: Payer: Medicaid Other

## 2016-10-02 ENCOUNTER — Ambulatory Visit (INDEPENDENT_AMBULATORY_CARE_PROVIDER_SITE_OTHER): Payer: Medicaid Other | Admitting: *Deleted

## 2016-10-02 DIAGNOSIS — Z3042 Encounter for surveillance of injectable contraceptive: Secondary | ICD-10-CM

## 2016-10-03 MED ORDER — MEDROXYPROGESTERONE ACETATE 150 MG/ML IM SUSP: 150.0000 mg | Freq: Once | INTRAMUSCULAR | Status: DC

## 2016-10-04 MED ORDER — MEDROXYPROGESTERONE ACETATE 150 MG/ML IM SUSY
150.0000 mg | PREFILLED_SYRINGE | Freq: Once | INTRAMUSCULAR | Status: AC
  Administered 2016-10-02: 150 mg via INTRAMUSCULAR

## 2016-10-04 NOTE — Progress Notes (Signed)
Patient here today for Depo Provera injection.  Depo given today in LUOQ.  Site unremarkable & patient tolerated injection.  Next injection due October 2-16 2019.  Reminder card given.  ~Jeannette Richardson, BSN, RN-BC  

## 2016-12-18 NOTE — Progress Notes (Signed)
   [facility] Phone: [phone]   Date of Visit: 12/19/2016   HPI:  Vaginal Discharge:  - reports she has been having white frothy vaginal discharge for the past few days. It also has a fishy odor - she tried OTC monostat without improvement - no abnormal vaginal bleeding - patient is on Depo Provera - is sexually active with the same long term partner  - she would like to be tested for STI - no dysuria, urinary frequency, suprapubic/pelvic pain - she is also due for her Pap   ROS: See HPI.  PMFSH:  Migraine  Arnold Chiari malformation, type 1 Bipolar DO Tobacco Dependence  PTSD Hx of chronic PID  PHYSICAL EXAM: BP 104/62   Pulse 87   Temp 98.9 F (37.2 C) (Oral)   Wt 162 lb (73.5 kg)   SpO2 96%   BMI 26.96 kg/m  Gen: NAD Heart: RRR, no m/r/g Lungs: normal effort, CTAB GU: Female genitalia: normal external genitalia, vulva, vagina, cervix, uterus and adnexa. White cottage cheese like discharge. No cervical motion tenderness   ASSESSMENT/PLAN:  Health maintenance:  - declined Flu vaccine   1. Vaginal discharge/Vaginal Yeast Infection  Wet prep with few yeast. Clinically consistent with yeast infection as well.  - POCT Wet Prep [facility]) - Cervicovaginal ancillary only - diflucan  once. If continued symptoms after three days, then repeat. #2 tabs prescribed.  - HIV, RPR, Gc/Chlamydia testing completed   2. Encounter for surveillance of injectable contraceptive - medroxyPROGESTERone (DEPO-PROVERA) injection 150 mg; Inject 1 mL (150 mg total) into the muscle once.  3. Routine screening for STI (sexually transmitted infection) - HIV antibody - RPR  4. Screening for malignant neoplasm of cervix - Cytology - PAP [place]  Palma Holter, MD PGY 3 [facility]

## 2016-12-19 ENCOUNTER — Other Ambulatory Visit (HOSPITAL_COMMUNITY)
Admission: RE | Admit: 2016-12-19 | Discharge: 2016-12-19 | Disposition: A | Discharge status: Home / Self Care | Payer: Medicaid Other | Source: Ambulatory Visit | Attending: Family Medicine | Admitting: Family Medicine

## 2016-12-19 ENCOUNTER — Ambulatory Visit (INDEPENDENT_AMBULATORY_CARE_PROVIDER_SITE_OTHER): Payer: Medicaid Other | Admitting: Internal Medicine

## 2016-12-19 ENCOUNTER — Encounter: Payer: Self-pay | Admitting: Internal Medicine

## 2016-12-19 VITALS — BP 104/62 | HR 87 | Temp 98.9°F | Wt 162.0 lb

## 2016-12-19 DIAGNOSIS — Z124 Encounter for screening for malignant neoplasm of cervix: Secondary | ICD-10-CM

## 2016-12-19 DIAGNOSIS — Z113 Encounter for screening for infections with a predominantly sexual mode of transmission: Secondary | ICD-10-CM

## 2016-12-19 DIAGNOSIS — Z3042 Encounter for surveillance of injectable contraceptive: Secondary | ICD-10-CM

## 2016-12-19 DIAGNOSIS — B3731 Acute candidiasis of vulva and vagina: Secondary | ICD-10-CM

## 2016-12-19 DIAGNOSIS — B373 Candidiasis of vulva and vagina: Secondary | ICD-10-CM

## 2016-12-19 LAB — POCT WET PREP (WET MOUNT)
Clue Cells Wet Prep Whiff POC: NEGATIVE
Trichomonas Wet Prep HPF POC: ABSENT

## 2016-12-19 MED ORDER — FLUCONAZOLE 150 MG PO TABS: 150.0000 mg | ORAL_TABLET | Freq: Every day | ORAL | 0 refills | Status: DC

## 2016-12-19 MED ORDER — MEDROXYPROGESTERONE ACETATE 150 MG/ML IM SUSP
150.0000 mg | Freq: Once | INTRAMUSCULAR | Status: AC
  Administered 2016-12-19: 150 mg via INTRAMUSCULAR

## 2016-12-19 NOTE — Patient Instructions (Addendum)
I will call you about the blood work results and pap smear.   It looks like you have a yeast infection. Take one tablet today then repeat in 3 days if you still have symptoms

## 2016-12-20 LAB — RPR: RPR Ser Ql: NONREACTIVE

## 2016-12-20 LAB — CERVICOVAGINAL ANCILLARY ONLY
CHLAMYDIA, DNA PROBE: NEGATIVE
Neisseria Gonorrhea: NEGATIVE

## 2016-12-20 LAB — HIV ANTIBODY (ROUTINE TESTING W REFLEX): HIV SCREEN 4TH GENERATION: NONREACTIVE

## 2016-12-26 ENCOUNTER — Encounter: Payer: Self-pay | Admitting: Internal Medicine

## 2016-12-26 ENCOUNTER — Telehealth: Payer: Self-pay | Admitting: Internal Medicine

## 2016-12-26 LAB — CYTOLOGY - PAP: HPV: NOT DETECTED

## 2016-12-26 NOTE — Telephone Encounter (Signed)
Called patient to discuss pap smear results. I recommended colposcopy. 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 If not, another option is to refer to GYN as they may have better availability. Patient will call back.

## 2017-02-04 ENCOUNTER — Ambulatory Visit (INDEPENDENT_AMBULATORY_CARE_PROVIDER_SITE_OTHER): Payer: Self-pay | Admitting: Internal Medicine

## 2017-02-04 ENCOUNTER — Encounter: Payer: Self-pay | Admitting: Internal Medicine

## 2017-02-04 ENCOUNTER — Other Ambulatory Visit: Payer: Self-pay

## 2017-02-04 VITALS — BP 120/78 | HR 90 | Temp 98.6°F | Ht 65.0 in | Wt 161.0 lb

## 2017-02-04 DIAGNOSIS — N760 Acute vaginitis: Secondary | ICD-10-CM

## 2017-02-04 DIAGNOSIS — N898 Other specified noninflammatory disorders of vagina: Secondary | ICD-10-CM

## 2017-02-04 DIAGNOSIS — B9689 Other specified bacterial agents as the cause of diseases classified elsewhere: Secondary | ICD-10-CM

## 2017-02-04 LAB — POCT WET PREP (WET MOUNT)
Clue Cells Wet Prep Whiff POC: NEGATIVE
Trichomonas Wet Prep HPF POC: ABSENT

## 2017-02-04 MED ORDER — METRONIDAZOLE 0.75 % VA GEL: VAGINAL | 2 refills | Status: DC

## 2017-02-04 MED ORDER — METRONIDAZOLE 500 MG PO TABS: 500.0000 mg | ORAL_TABLET | Freq: Two times a day (BID) | ORAL | 0 refills | Status: DC

## 2017-02-04 NOTE — Assessment & Plan Note (Addendum)
Multiple episodes yearly. Wet prep positive for clue cells today. Patient has never been tried on maintenance/suppressive therapy, but is amenable to starting this today. Patient denying unprotected intercourse since last visit, and GC/chlamydia checked on 10/03, so she declines repeat testing today.  - Flagyl BID x7d - Afterwards, begin Metro Gel twice weekly x6 months (per UpToDate recommendations)

## 2017-02-04 NOTE — Patient Instructions (Signed)
It was nice seeing you again today Suzanne Ellis!  Please take metronidazole twice daily for the next 7 days. After that, you will begin using the metronidazole gel. You will place one applicatorful vaginally two times a week for the next 6 months.   If you have any questions or concerns, please feel free to call the clinic.   Be well,  Dr. Natale MilchLancaster

## 2017-02-04 NOTE — Progress Notes (Signed)
   Subjective:   Patient: Suzanne Ellis       Birthdate: 02/04/1986       MRN: [id number]      HPI  Suzanne Ellis is a 31 y.o. female presenting for vaginal discharge.   Vaginal discharge Present for about a week. Describes as thick and slimy, with color ranging from light pink to white. Denies vaginal irritation or itching. Denies dysuria, hematuria. Endorses fishy and/or foul odor to discharge. LMP "years ago." Is on Depo and up to date on this. Says it has been "years" since last episode of unprotected sex. Has multiple episodes of BV per year. Says no one has been able to figure out why she develops this so frequently. Has never been on maintenance therapy.   Smoking status reviewed. Patient is current every day smoker.   Review of Systems See HPI.     Objective:  Physical Exam  Constitutional: She is oriented to person, place, and time and well-developed, well-nourished, and in no distress.  HENT:  Head: Normocephalic and atraumatic.  Pulmonary/Chest: Effort normal. No respiratory distress.  Abdominal: Soft. She exhibits no distension. There is no tenderness.  Genitourinary: Cervix normal, right adnexa normal and left adnexa normal.  Genitourinary Comments: Exam performed with chaperone in room.   White vaginal discharge present.   Neurological: She is alert and oriented to person, place, and time.  Psychiatric: Affect and judgment normal.      Assessment & Plan:  Recurrent bacterial vaginosis Multiple episodes yearly. Wet prep positive for clue cells today. Patient has never been tried on maintenance/suppressive therapy, but is amenable to starting this today. Patient denying unprotected intercourse since last visit, and GC/chlamydia checked on 10/03, so she declines repeat testing today.  - Flagyl BID x7d - Afterwards, begin Metro Gel twice weekly x6 months (per UpToDate recommendations)   Tarri AbernethyAbigail J Kamil Hanigan, MD, MPH PGY-3 [facility] Pager [phone]

## 2017-02-05 ENCOUNTER — Ambulatory Visit: Payer: Medicaid Other | Admitting: Family Medicine

## 2017-02-28 ENCOUNTER — Ambulatory Visit: Payer: Medicaid Other

## 2017-03-28 ENCOUNTER — Encounter: Payer: Self-pay | Admitting: Internal Medicine

## 2017-04-04 ENCOUNTER — Encounter (HOSPITAL_COMMUNITY): Payer: Self-pay | Admitting: Emergency Medicine

## 2017-04-04 ENCOUNTER — Emergency Department (HOSPITAL_COMMUNITY)
Admission: EM | Admit: 2017-04-04 | Discharge: 2017-04-04 | Disposition: A | Discharge status: Home / Self Care | Payer: Self-pay | Attending: Emergency Medicine | Admitting: Emergency Medicine

## 2017-04-04 ENCOUNTER — Other Ambulatory Visit: Payer: Self-pay

## 2017-04-04 ENCOUNTER — Ambulatory Visit: Payer: Medicaid Other

## 2017-04-04 ENCOUNTER — Emergency Department (HOSPITAL_COMMUNITY)
Admission: EM | Admit: 2017-04-04 | Discharge: 2017-04-04 | Discharge status: Against Medical Advice | Payer: Medicaid Other | Attending: Emergency Medicine | Admitting: Emergency Medicine

## 2017-04-04 DIAGNOSIS — K0889 Other specified disorders of teeth and supporting structures: Secondary | ICD-10-CM | POA: Insufficient documentation

## 2017-04-04 DIAGNOSIS — Z79899 Other long term (current) drug therapy: Secondary | ICD-10-CM | POA: Insufficient documentation

## 2017-04-04 DIAGNOSIS — Z5321 Procedure and treatment not carried out due to patient leaving prior to being seen by health care provider: Secondary | ICD-10-CM | POA: Insufficient documentation

## 2017-04-04 DIAGNOSIS — F17210 Nicotine dependence, cigarettes, uncomplicated: Secondary | ICD-10-CM | POA: Insufficient documentation

## 2017-04-04 MED ORDER — OXYCODONE-ACETAMINOPHEN 5-325 MG PO TABS
1.0000 | ORAL_TABLET | ORAL | Status: DC | PRN
  Administered 2017-04-04: 1 via ORAL
  Filled 2017-04-04: qty 1

## 2017-04-04 MED ORDER — HYDROCODONE-ACETAMINOPHEN 5-325 MG PO TABS: 2.0000 | ORAL_TABLET | ORAL | 0 refills | Status: DC | PRN

## 2017-04-04 MED ORDER — CLINDAMYCIN HCL 300 MG PO CAPS: 300.0000 mg | ORAL_CAPSULE | Freq: Four times a day (QID) | ORAL | 0 refills | Status: DC

## 2017-04-04 MED ORDER — HYDROCODONE-ACETAMINOPHEN 5-325 MG PO TABS
2.0000 | ORAL_TABLET | Freq: Once | ORAL | Status: AC
Start: 2017-04-04 — End: 2017-04-04
  Administered 2017-04-04: 2 via ORAL
  Filled 2017-04-04: qty 2

## 2017-04-04 MED ORDER — IBUPROFEN 400 MG PO TABS
400.0000 mg | ORAL_TABLET | Freq: Once | ORAL | Status: AC | PRN
Start: 2017-04-04 — End: 2017-04-04
  Administered 2017-04-04: 400 mg via ORAL
  Filled 2017-04-04: qty 1

## 2017-04-04 NOTE — ED Triage Notes (Signed)
Patient requesting to speak to a nurse again, states, "whatever they gave me over there just made me sleepy and didn't help my pain at all." RN explained to patient triage process and that she needed to be evaluated by an ED physician.

## 2017-04-04 NOTE — ED Notes (Signed)
No answer in waiting room 

## 2017-04-04 NOTE — ED Triage Notes (Signed)
Patient with dental pain, she states that she took Ibuprofen with no relief of the pain.  Pain is located in the right upper and lower jaw.

## 2017-04-04 NOTE — ED Triage Notes (Signed)
Per [facility] pt was seen at dentist this am given pain medicine and antibiotics pt here due to pain medicine not working.

## 2017-04-04 NOTE — ED Notes (Signed)
No answer in waiting.  

## 2017-04-04 NOTE — ED Provider Notes (Signed)
[facility]-EMERGENCY DEPT Provider Note   CSN: [id number] Arrival date & time: 04/04/17  1330     History   Chief Complaint Chief Complaint  Patient presents with  . Dental Pain    HPI Suzanne Ellis is a 32 y.o. female.  The history is provided by the patient. No language interpreter was used.  Dental Pain   This is a new problem. The current episode started yesterday. The problem occurs constantly. The problem has been gradually worsening. The pain is severe. She has tried nothing for the symptoms. The treatment provided no relief.  Pt reports she saw dentist and was given antibiotics.  Pt reports no pain relief   Past Medical History:  Diagnosis Date  . 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 CHLAMYDTRACHOMATIS INFECTION LOWER GU SITES 07/28/2008   Qualifier: Diagnosis of  By: Jake Sharkhaney RN, Amy    . No pertinent past medical history     Patient Active Problem List   Diagnosis Date Noted  . 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 Migraine with aura 12/07/2015  . Unspecified episodic mood disorder 10/28/2013  . Recurrent bacterial vaginosis 05/22/2013  . Bipolar disorder (HCC) 01/19/2013  . Vaginal discharge 09/19/2011  . MIGRAINE WITHOUT AURA 09/14/2009  . PTSD 08/18/2009  . History of chronic PID 01/06/2009  . TOBACCO DEPENDENCE 05/16/2006  . DEPRESSIVE DISORDER, NOS 05/16/2006    Past Surgical History:  Procedure Laterality Date  . ARM WOUND REPAIR / CLOSURE    . DILATION AND CURETTAGE OF UTERUS    . termination    . WISDOM TOOTH EXTRACTION      OB History    Gravida Para Term Preterm AB Living   5 3 3  0 2 3   SAB TAB Ectopic Multiple Live Births   0 2 0 0 3       Home Medications    Prior to Admission medications   Medication Sig Start Date End Date Taking? Authorizing Provider  butalbital-acetaminophen-caffeine (FIORICET, ESGIC) 50-325-40 MG tablet Take 1 tablet by mouth every 6 (six) hours as  needed for headache. 12/29/15   York SpanielWillis, Charles K, MD  clindamycin (CLEOCIN) 300 MG capsule Take 1 capsule (300 mg total) by mouth every 6 (six) hours. 04/04/17   Elson AreasSofia, Odesser Tourangeau K, PA-C  fluconazole (DIFLUCAN) 150 MG tablet Take 1 tablet (150 mg total) by mouth daily. Repeat after 3 days if still having symptoms 12/19/16   Palma HolterGunadasa, Kanishka G, MD  HYDROcodone-acetaminophen (NORCO/VICODIN) 5-325 MG tablet Take 2 tablets by mouth every 4 (four) hours as needed. 04/04/17   Elson AreasSofia, Jameya Pontiff K, PA-C  ibuprofen (ADVIL,MOTRIN) 200 MG tablet Take 400 mg by mouth every 6 (six) hours as needed for moderate pain.     [provider]  medroxyPROGESTERone (DEPO-PROVERA) 150 MG/ML injection Inject 150 mg into the muscle every 3 (three) months.    [provider]  metroNIDAZOLE (FLAGYL) 500 MG tablet Take 1 tablet (500 mg total) 2 (two) times daily by mouth. 02/04/17   Marquette SaaLancaster, Abigail Joseph, MD  metroNIDAZOLE (METROGEL VAGINAL) 0.75 % vaginal gel Apply 1 applicator-full vaginally twice weekly. 02/04/17   Marquette SaaLancaster, Abigail Joseph, MD  Multiple Vitamin (MULTIVITAMIN) tablet Take 1 tablet by mouth daily.    [provider]  topiramate (TOPAMAX) 25 MG tablet Take one tablet at night for one week, then take 2 tablets at night for one week, then take 3 tablets at night. 01/12/16   McKeag, Janine OresIan D, MD  Family History Family History  Problem Relation Age of Onset  . Hypertension Mother   . Cancer Mother        lung  . Migraines Mother   . Anesthesia problems Other        MGF allergic to a common type of anesthesia  . Cancer Maternal Grandmother        skin  . Diabetes Maternal Grandmother   . Migraines Sister   . Seizures Sister   . Hypotension Neg Hx   . Malignant hyperthermia Neg Hx   . Pseudochol deficiency Neg Hx     Social History Social History   Tobacco Use  . Smoking status: Current Every Day Smoker    Packs/day: 0.50    Years: 10.00    Pack years: 5.00    Types:  Cigarettes  . Smokeless tobacco: Never Used  Substance Use Topics  . Alcohol use: Yes    Comment: occasionally   . Drug use: No     Allergies   Lamictal [lamotrigine]   Review of Systems Review of Systems  All other systems reviewed and are negative.    Physical Exam Updated Vital Signs BP 132/90   Pulse 84   Temp 98.7 F (37.1 C) (Oral)   Resp 18   SpO2 97%   Physical Exam  Constitutional: She appears well-developed.  HENT:  Head: Normocephalic.  Broken tooth in gumline,  Swelling around.  Eyes: Pupils are equal, round, and reactive to light.  Cardiovascular: Normal rate.  Pulmonary/Chest: Effort normal.  Neurological: She is alert.  Skin: Skin is warm.  Psychiatric: She has a normal mood and affect.  Nursing note and vitals reviewed.    ED Treatments / Results  Labs (all labs ordered are listed, but only abnormal results are displayed) Labs Reviewed - No data to display  EKG  EKG Interpretation None       Radiology No results found.  Procedures Procedures (including critical care time)  Medications Ordered in ED Medications  HYDROcodone-acetaminophen (NORCO/VICODIN) 5-325 MG per tablet 2 tablet (2 tablets Oral Given 04/04/17 1444)     Initial Impression / Assessment and Plan / ED Course  I have reviewed the triage vital signs and the nursing notes.  Pertinent labs & imaging results that were available during my care of the patient were reviewed by me and considered in my medical decision making (see chart for details).       Final Clinical Impressions(s) / ED Diagnoses   Final diagnoses:  Pain, dental    ED Discharge Orders        Ordered    clindamycin (CLEOCIN) 300 MG capsule  Every 6 hours     04/04/17 1442    HYDROcodone-acetaminophen (NORCO/VICODIN) 5-325 MG tablet  Every 4 hours PRN     04/04/17 1442    An After Visit Summary was printed and given to the patient.    Elson Areas, [place] 04/04/17 1456    Gerhard Munch, MD 04/04/17 [phone]

## 2017-04-19 ENCOUNTER — Ambulatory Visit (INDEPENDENT_AMBULATORY_CARE_PROVIDER_SITE_OTHER): Payer: Medicaid Other

## 2017-04-19 ENCOUNTER — Telehealth: Payer: Self-pay

## 2017-04-19 DIAGNOSIS — Z3049 Encounter for surveillance of other contraceptives: Secondary | ICD-10-CM | POA: Diagnosis not present

## 2017-04-19 DIAGNOSIS — Z3042 Encounter for surveillance of injectable contraceptive: Secondary | ICD-10-CM

## 2017-04-19 LAB — POCT URINE PREGNANCY: PREG TEST UR: NEGATIVE

## 2017-04-19 MED ORDER — MEDROXYPROGESTERONE ACETATE 150 MG/ML IM SUSY
150.0000 mg | PREFILLED_SYRINGE | Freq: Once | INTRAMUSCULAR | Status: AC
  Administered 2017-04-19: 150 mg via INTRAMUSCULAR

## 2017-04-19 NOTE — Telephone Encounter (Signed)
Patient was in to nurse clinic for Depo. No appointments were available with providers and patient states she has BV again and she gets it quite frequently. Patient states she has a vaginal odor and small amount of discharge.   Patient has refills on her Metrogel but without insurance it is $125 which she cannot pay. She would like to know if her PCP will send in Flagyl and a Diflucan for her so she does not have to take another day off work to return. [name], RN [facility] RN)

## 2017-04-19 NOTE — Progress Notes (Signed)
   Patient here today for Depo Provera injection. Not within dates. Urine pregnancy negative. Patient denies unprotected intercourse. Depo given today in RUOQGM. Site unremarkable & patient tolerated injection well. Next injection due 07/05/17 - 07/19/17. Reminder card given. Ples SpecterAlisa Ignace Mandigo, RN [facility])

## 2017-04-22 ENCOUNTER — Ambulatory Visit: Payer: Self-pay | Admitting: Internal Medicine

## 2017-04-22 MED ORDER — METRONIDAZOLE 500 MG PO TABS: 500.0000 mg | ORAL_TABLET | Freq: Two times a day (BID) | ORAL | 0 refills | Status: DC

## 2017-04-22 MED ORDER — FLUCONAZOLE 150 MG PO TABS: 150.0000 mg | ORAL_TABLET | Freq: Every day | ORAL | 0 refills | Status: DC

## 2017-04-22 NOTE — Telephone Encounter (Signed)
I have sent in Flagyl and Diflucan. If she is still having symptoms despite these medications she will need to be seen for an appointment.   Marcy Sirenatherine Arlena Marsan, D.O. 04/22/2017, 9:43 AM PGY-3, [facility]

## 2017-04-22 NOTE — Telephone Encounter (Signed)
lmovm for return call. Suzanne Ellis, Suzanne Ellis, CMA

## 2017-08-11 ENCOUNTER — Telehealth: Payer: Self-pay | Admitting: Family Medicine

## 2017-08-11 NOTE — Telephone Encounter (Signed)
**  After Hours/ Emergency Line Call*  Received a call to report that Suzanne Ellis is having right sided facial weakness for the past 4 days. Cannot squeeze eye shut, right eye tearing.  Endorsing weakness in her right hand as well.  Denying incontinence, slurred speech, pain anywhere, fevers. Has chronic migraines, last one Wednesday.  Has never had weakness or neuro deficits with migraines before. She has chiari malformation, lost insurance so does not follow with neurology but surgery has been recommended in the past. Recommended that she come to ED to be evaluated. She is agreeable to this. Will forward to PCP.  Tillman Sers, DO PGY-2, [facility]

## 2017-08-23 ENCOUNTER — Other Ambulatory Visit: Payer: Self-pay

## 2017-08-23 ENCOUNTER — Ambulatory Visit (INDEPENDENT_AMBULATORY_CARE_PROVIDER_SITE_OTHER): Payer: Self-pay | Admitting: Family Medicine

## 2017-08-23 ENCOUNTER — Encounter: Payer: Self-pay | Admitting: Family Medicine

## 2017-08-23 ENCOUNTER — Other Ambulatory Visit (HOSPITAL_COMMUNITY)
Admission: RE | Admit: 2017-08-23 | Discharge: 2017-08-23 | Disposition: A | Discharge status: Home / Self Care | Payer: Medicaid Other | Source: Ambulatory Visit | Attending: Family Medicine | Admitting: Family Medicine

## 2017-08-23 VITALS — BP 104/60 | Temp 99.0°F | Ht 65.0 in | Wt 168.8 lb

## 2017-08-23 DIAGNOSIS — N898 Other specified noninflammatory disorders of vagina: Secondary | ICD-10-CM

## 2017-08-23 DIAGNOSIS — Z124 Encounter for screening for malignant neoplasm of cervix: Secondary | ICD-10-CM

## 2017-08-23 DIAGNOSIS — N760 Acute vaginitis: Secondary | ICD-10-CM

## 2017-08-23 DIAGNOSIS — Z3042 Encounter for surveillance of injectable contraceptive: Secondary | ICD-10-CM

## 2017-08-23 DIAGNOSIS — B9689 Other specified bacterial agents as the cause of diseases classified elsewhere: Secondary | ICD-10-CM

## 2017-08-23 DIAGNOSIS — R109 Unspecified abdominal pain: Secondary | ICD-10-CM

## 2017-08-23 DIAGNOSIS — R87612 Low grade squamous intraepithelial lesion on cytologic smear of cervix (LGSIL): Secondary | ICD-10-CM

## 2017-08-23 DIAGNOSIS — R1031 Right lower quadrant pain: Secondary | ICD-10-CM

## 2017-08-23 DIAGNOSIS — G8929 Other chronic pain: Secondary | ICD-10-CM

## 2017-08-23 DIAGNOSIS — Z3009 Encounter for other general counseling and advice on contraception: Secondary | ICD-10-CM

## 2017-08-23 DIAGNOSIS — R87619 Unspecified abnormal cytological findings in specimens from cervix uteri: Secondary | ICD-10-CM | POA: Insufficient documentation

## 2017-08-23 LAB — POCT WET PREP (WET MOUNT)
CLUE CELLS WET PREP WHIFF POC: POSITIVE
TRICHOMONAS WET PREP HPF POC: ABSENT

## 2017-08-23 LAB — POCT URINALYSIS DIP (MANUAL ENTRY)
BILIRUBIN UA: NEGATIVE
Blood, UA: NEGATIVE
Glucose, UA: NEGATIVE mg/dL
Ketones, POC UA: NEGATIVE mg/dL
LEUKOCYTES UA: NEGATIVE
NITRITE UA: NEGATIVE
PH UA: 6 (ref 5.0–8.0)
PROTEIN UA: NEGATIVE mg/dL
Spec Grav, UA: 1.025 (ref 1.010–1.025)
Urobilinogen, UA: 0.2 E.U./dL

## 2017-08-23 LAB — POCT URINE PREGNANCY: PREG TEST UR: NEGATIVE

## 2017-08-23 MED ORDER — MEDROXYPROGESTERONE ACETATE 150 MG/ML IM SUSY
150.0000 mg | PREFILLED_SYRINGE | Freq: Once | INTRAMUSCULAR | Status: AC
  Administered 2017-08-23: 150 mg via INTRAMUSCULAR

## 2017-08-23 MED ORDER — METRONIDAZOLE 500 MG PO TABS: 500.0000 mg | ORAL_TABLET | Freq: Two times a day (BID) | ORAL | 0 refills | Status: DC
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
## 2017-08-23 NOTE — Patient Instructions (Signed)
It was a pleasure to see you today! Thank you for choosing [facility] for your primary care. Suzanne Ellis was seen for vaginal discharge.   Our plans for today were:  Take the metronidazole, do not drink alcohol while on this medication.   I will call you about your labs.   Best,  Dr. Chanetta Marshallimberlake

## 2017-08-23 NOTE — Assessment & Plan Note (Signed)
Present again today, patient was unable to afford MetroGel in the past.  Will prescribe short course of metronidazole and return to discuss with PCP if persistent symptoms.

## 2017-08-23 NOTE — Assessment & Plan Note (Signed)
Paps appear to have been normal prior to November 2018, when Pap was consistent with L SIL/CIN-1.  Patient asked to return for colposcopy, but she did not because she was anxious about exam.  Given anxiety about exam, will go ahead and perform repeat Pap today in keeping with ASCCP guidelines (age >30, LSIL with neg HPV, repeat w cotesting 1  Year).  If persistent LSIL, needs colposcopy.

## 2017-08-24 LAB — CMP14+EGFR
ALT: 10 IU/L (ref 0–32)
AST: 12 IU/L (ref 0–40)
Albumin/Globulin Ratio: 1.6 (ref 1.2–2.2)
Albumin: 4.4 g/dL (ref 3.5–5.5)
Alkaline Phosphatase: 57 IU/L (ref 39–117)
BUN/Creatinine Ratio: 16 (ref 9–23)
BUN: 10 mg/dL (ref 6–20)
Bilirubin Total: 0.2 mg/dL (ref 0.0–1.2)
CALCIUM: 9.2 mg/dL (ref 8.7–10.2)
CO2: 22 mmol/L (ref 20–29)
CREATININE: 0.63 mg/dL (ref 0.57–1.00)
Chloride: 103 mmol/L (ref 96–106)
GFR calc Af Amer: 137 mL/min/{1.73_m2} (ref >59)
GFR, EST NON AFRICAN AMERICAN: 119 mL/min/{1.73_m2} (ref >59)
GLUCOSE: 81 mg/dL (ref 65–99)
Globulin, Total: 2.7 g/dL (ref 1.5–4.5)
Potassium: 4.6 mmol/L (ref 3.5–5.2)
Sodium: 139 mmol/L (ref 134–144)
Total Protein: 7.1 g/dL (ref 6.0–8.5)

## 2017-08-24 LAB — CBC
HEMATOCRIT: 40.2 % (ref 34.0–46.6)
Hemoglobin: 13.1 g/dL (ref 11.1–15.9)
MCH: 27.5 pg (ref 26.6–33.0)
MCHC: 32.6 g/dL (ref 31.5–35.7)
MCV: 84 fL (ref 79–97)
Platelets: 223 10*3/uL (ref 150–450)
RBC: 4.77 x10E6/uL (ref 3.77–5.28)
RDW: 13.8 % (ref 12.3–15.4)
WBC: 9.4 10*3/uL (ref 3.4–10.8)

## 2017-08-26 LAB — CERVICOVAGINAL ANCILLARY ONLY
Chlamydia: NEGATIVE
NEISSERIA GONORRHEA: NEGATIVE
Trichomonas: NEGATIVE

## 2017-08-27 LAB — CYTOLOGY - PAP
Adequacy: ABSENT
Diagnosis: NEGATIVE
HPV: NOT DETECTED

## 2017-08-28 ENCOUNTER — Telehealth: Payer: Self-pay | Admitting: Family Medicine

## 2017-08-28 NOTE — Telephone Encounter (Signed)
Called patient to discuss labs. No lab cause of abdominal pain.  Repeat Pap smear without transformation zone, however negative cytology negative HPV.  Per ASCCP guidelines, repeat with cotesting in 3 years.  Notify patient of this, also scheduled her an appointment with PCP later this month to discuss chronic abdominal pain.  Return precautions given.

## 2017-09-05 ENCOUNTER — Ambulatory Visit: Payer: Medicaid Other | Admitting: Internal Medicine

## 2017-10-20 ENCOUNTER — Encounter: Payer: Self-pay | Admitting: Family Medicine

## 2017-10-20 ENCOUNTER — Other Ambulatory Visit: Payer: Self-pay

## 2017-10-20 ENCOUNTER — Emergency Department (HOSPITAL_COMMUNITY): Payer: Medicaid Other

## 2017-10-20 ENCOUNTER — Emergency Department (HOSPITAL_COMMUNITY)
Admission: EM | Admit: 2017-10-20 | Discharge: 2017-10-20 | Disposition: A | Discharge status: Home / Self Care | Payer: Medicaid Other | Attending: Emergency Medicine | Admitting: Emergency Medicine

## 2017-10-20 ENCOUNTER — Encounter (HOSPITAL_COMMUNITY): Payer: Self-pay | Admitting: *Deleted

## 2017-10-20 DIAGNOSIS — R112 Nausea with vomiting, unspecified: Secondary | ICD-10-CM | POA: Insufficient documentation

## 2017-10-20 DIAGNOSIS — S0990XA Unspecified injury of head, initial encounter: Secondary | ICD-10-CM | POA: Diagnosis not present

## 2017-10-20 DIAGNOSIS — M542 Cervicalgia: Secondary | ICD-10-CM | POA: Diagnosis not present

## 2017-10-20 DIAGNOSIS — M62838 Other muscle spasm: Secondary | ICD-10-CM | POA: Diagnosis not present

## 2017-10-20 DIAGNOSIS — S060X0A Concussion without loss of consciousness, initial encounter: Secondary | ICD-10-CM | POA: Insufficient documentation

## 2017-10-20 DIAGNOSIS — H538 Other visual disturbances: Secondary | ICD-10-CM | POA: Insufficient documentation

## 2017-10-20 DIAGNOSIS — F17210 Nicotine dependence, cigarettes, uncomplicated: Secondary | ICD-10-CM | POA: Diagnosis not present

## 2017-10-20 DIAGNOSIS — Y998 Other external cause status: Secondary | ICD-10-CM | POA: Diagnosis not present

## 2017-10-20 DIAGNOSIS — Z79899 Other long term (current) drug therapy: Secondary | ICD-10-CM | POA: Diagnosis not present

## 2017-10-20 DIAGNOSIS — R52 Pain, unspecified: Secondary | ICD-10-CM | POA: Insufficient documentation

## 2017-10-20 DIAGNOSIS — Y9389 Activity, other specified: Secondary | ICD-10-CM | POA: Insufficient documentation

## 2017-10-20 DIAGNOSIS — G44319 Acute post-traumatic headache, not intractable: Secondary | ICD-10-CM | POA: Diagnosis not present

## 2017-10-20 DIAGNOSIS — Y929 Unspecified place or not applicable: Secondary | ICD-10-CM | POA: Diagnosis not present

## 2017-10-20 DIAGNOSIS — R51 Headache: Secondary | ICD-10-CM | POA: Diagnosis not present

## 2017-10-20 DIAGNOSIS — G44309 Post-traumatic headache, unspecified, not intractable: Secondary | ICD-10-CM | POA: Diagnosis not present

## 2017-10-20 DIAGNOSIS — R42 Dizziness and giddiness: Secondary | ICD-10-CM | POA: Diagnosis not present

## 2017-10-20 DIAGNOSIS — S199XXA Unspecified injury of neck, initial encounter: Secondary | ICD-10-CM | POA: Diagnosis not present

## 2017-10-20 LAB — I-STAT CHEM 8, ED
BUN: 13 mg/dL (ref 6–20)
CREATININE: 0.8 mg/dL (ref 0.44–1.00)
Calcium, Ion: 1.19 mmol/L (ref 1.15–1.40)
Chloride: 104 mmol/L (ref 98–111)
Glucose, Bld: 88 mg/dL (ref 70–99)
HEMATOCRIT: 37 % (ref 36.0–46.0)
Hemoglobin: 12.6 g/dL (ref 12.0–15.0)
POTASSIUM: 3.6 mmol/L (ref 3.5–5.1)
SODIUM: 140 mmol/L (ref 135–145)
TCO2: 26 mmol/L (ref 22–32)

## 2017-10-20 LAB — I-STAT BETA HCG BLOOD, ED (MC, WL, AP ONLY): I-stat hCG, quantitative: <5 m[IU]/mL (ref <5)

## 2017-10-20 MED ORDER — CYCLOBENZAPRINE HCL 10 MG PO TABS
10.0000 mg | ORAL_TABLET | Freq: Once | ORAL | Status: AC
  Administered 2017-10-20: 10 mg via ORAL
  Filled 2017-10-20: qty 1

## 2017-10-20 MED ORDER — CYCLOBENZAPRINE HCL 10 MG PO TABS: 10.0000 mg | ORAL_TABLET | Freq: Three times a day (TID) | ORAL | 0 refills | Status: DC | PRN

## 2017-10-20 MED ORDER — SODIUM CHLORIDE 0.9 % IV BOLUS
1000.0000 mL | Freq: Once | INTRAVENOUS | Status: AC
  Administered 2017-10-20: 1000 mL via INTRAVENOUS

## 2017-10-20 MED ORDER — NAPROXEN 500 MG PO TABS: 500.0000 mg | ORAL_TABLET | Freq: Two times a day (BID) | ORAL | 0 refills | Status: DC | PRN

## 2017-10-20 MED ORDER — METOCLOPRAMIDE HCL 5 MG/ML IJ SOLN
10.0000 mg | Freq: Once | INTRAMUSCULAR | Status: AC
  Administered 2017-10-20: 10 mg via INTRAVENOUS
  Filled 2017-10-20: qty 2

## 2017-10-20 MED ORDER — DIPHENHYDRAMINE HCL 50 MG/ML IJ SOLN
25.0000 mg | Freq: Once | INTRAMUSCULAR | Status: AC
  Administered 2017-10-20: 25 mg via INTRAVENOUS
  Filled 2017-10-20: qty 1

## 2017-10-20 MED ORDER — METOCLOPRAMIDE HCL 10 MG PO TABS: 10.0000 mg | ORAL_TABLET | Freq: Four times a day (QID) | ORAL | 0 refills | Status: DC | PRN

## 2017-10-20 MED ORDER — MORPHINE SULFATE (PF) 4 MG/ML IV SOLN
4.0000 mg | Freq: Once | INTRAVENOUS | Status: AC
  Administered 2017-10-20: 4 mg via INTRAVENOUS
  Filled 2017-10-20: qty 1

## 2017-10-20 NOTE — ED Notes (Signed)
Pt ambulatory to room from main lobby
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
## 2017-10-21 ENCOUNTER — Telehealth: Payer: Self-pay | Admitting: Family Medicine

## 2017-10-21 NOTE — Telephone Encounter (Signed)
White team, can you call patient and clarify what she is wanting to see? I think patients need to request records to get actual images (this [facility] done [facility]).

## 2017-10-23 NOTE — Telephone Encounter (Signed)
Spoke to pt. She wasn't only calling about the images. [facility] Pt has an appt with Dr. Parke SimmersBland on Friday. Will discuss images and headaches. Sunday SpillersSharon T Anurag Scarfo, CMA

## 2017-10-25 ENCOUNTER — Ambulatory Visit (INDEPENDENT_AMBULATORY_CARE_PROVIDER_SITE_OTHER): Payer: Medicaid Other | Admitting: Family Medicine

## 2017-10-25 ENCOUNTER — Other Ambulatory Visit: Payer: Self-pay

## 2017-10-25 ENCOUNTER — Encounter: Payer: Self-pay | Admitting: Family Medicine

## 2017-10-25 VITALS — BP 98/60 | HR 99 | Temp 98.6°F | Ht 65.0 in | Wt 169.8 lb

## 2017-10-25 DIAGNOSIS — M25511 Pain in right shoulder: Secondary | ICD-10-CM | POA: Diagnosis not present

## 2017-10-25 DIAGNOSIS — M25512 Pain in left shoulder: Secondary | ICD-10-CM | POA: Diagnosis not present

## 2017-10-25 MED ORDER — TRAMADOL HCL 50 MG PO TABS: 50.0000 mg | ORAL_TABLET | Freq: Every day | ORAL | 0 refills | Status: AC | PRN

## 2017-10-25 MED ORDER — BACLOFEN 10 MG PO TABS: 10.0000 mg | ORAL_TABLET | Freq: Three times a day (TID) | ORAL | 0 refills | Status: DC
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
## 2017-10-28 DIAGNOSIS — M25511 Pain in right shoulder: Secondary | ICD-10-CM | POA: Insufficient documentation

## 2017-10-28 DIAGNOSIS — M25512 Pain in left shoulder: Principal | ICD-10-CM
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
## 2017-10-29 ENCOUNTER — Ambulatory Visit: Payer: No Typology Code available for payment source | Admitting: Physical Therapy

## 2017-10-30 ENCOUNTER — Other Ambulatory Visit: Payer: Self-pay

## 2017-10-30 ENCOUNTER — Encounter: Payer: Self-pay | Admitting: Family Medicine

## 2017-10-30 ENCOUNTER — Ambulatory Visit (INDEPENDENT_AMBULATORY_CARE_PROVIDER_SITE_OTHER): Payer: Medicaid Other | Admitting: Family Medicine

## 2017-10-30 VITALS — BP 104/68 | HR 96 | Temp 98.8°F | Ht 65.0 in | Wt 169.4 lb

## 2017-10-30 DIAGNOSIS — N898 Other specified noninflammatory disorders of vagina: Secondary | ICD-10-CM

## 2017-10-30 DIAGNOSIS — B9689 Other specified bacterial agents as the cause of diseases classified elsewhere: Secondary | ICD-10-CM

## 2017-10-30 DIAGNOSIS — N760 Acute vaginitis: Secondary | ICD-10-CM | POA: Diagnosis not present

## 2017-10-30 DIAGNOSIS — S134XXD Sprain of ligaments of cervical spine, subsequent encounter: Secondary | ICD-10-CM | POA: Diagnosis not present

## 2017-10-30 DIAGNOSIS — G43109 Migraine with aura, not intractable, without status migrainosus: Secondary | ICD-10-CM

## 2017-10-30 DIAGNOSIS — S060X0D Concussion without loss of consciousness, subsequent encounter: Secondary | ICD-10-CM

## 2017-10-30 LAB — POCT WET PREP (WET MOUNT)
Clue Cells Wet Prep Whiff POC: NEGATIVE
Trichomonas Wet Prep HPF POC: ABSENT

## 2017-10-30 MED ORDER — METRONIDAZOLE 500 MG PO TABS: 500.0000 mg | ORAL_TABLET | Freq: Two times a day (BID) | ORAL | 0 refills | Status: AC

## 2017-10-30 MED ORDER — FLUCONAZOLE 150 MG PO TABS: 150.0000 mg | ORAL_TABLET | Freq: Once | ORAL | 0 refills | Status: AC

## 2017-10-30 NOTE — Progress Notes (Signed)
wep

## 2017-10-30 NOTE — Patient Instructions (Signed)
I sent in the two medications for your vaginal discharge. You are confusing about the headaches.  I am delighted that they have begun to get better.  Slowly wean yourself off the naprosyn and baclofen.

## 2017-10-31 ENCOUNTER — Telehealth: Payer: Self-pay | Admitting: Family Medicine

## 2017-10-31 DIAGNOSIS — B9689 Other specified bacterial agents as the cause of diseases classified elsewhere: Secondary | ICD-10-CM

## 2017-10-31 DIAGNOSIS — N760 Acute vaginitis: Principal | ICD-10-CM

## 2017-10-31 MED ORDER — METRONIDAZOLE 500 MG PO TABS: 500.0000 mg | ORAL_TABLET | Freq: Two times a day (BID) | ORAL | 0 refills | Status: AC

## 2017-10-31 NOTE — Telephone Encounter (Signed)
Positive clue cells/bacteria for BV.  Called patient and prescribed metro 500 BID x7days  -Dr. Parke SimmersBland

## 2017-11-03 ENCOUNTER — Encounter: Payer: Self-pay | Admitting: Family Medicine

## 2017-11-03 DIAGNOSIS — S060X0A Concussion without loss of consciousness, initial encounter: Secondary | ICD-10-CM | POA: Insufficient documentation

## 2017-11-03 DIAGNOSIS — S134XXD Sprain of ligaments of cervical spine, subsequent encounter: Secondary | ICD-10-CM | POA: Insufficient documentation

## 2017-11-03 NOTE — Assessment & Plan Note (Addendum)
Seens to be resolving without sequelli Improving.  Slowly wean scheduled NSAID and muscle relaxer.  Explained she is at risk for recurrent HA Work note given.

## 2017-11-03 NOTE — Assessment & Plan Note (Signed)
Again, multifactorial headaches.  By hx and exam.  Neck flexion extension injury is playing a role.

## 2017-11-03 NOTE — Assessment & Plan Note (Signed)
Multifactorial headaches.  Some seem migraine.  Some seem post concussive.

## 2017-11-03 NOTE — Assessment & Plan Note (Signed)
Recurrent BV.  Treat per usual plus diflucan
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
## 2017-11-06 ENCOUNTER — Ambulatory Visit: Payer: Medicaid Other | Attending: Family Medicine | Admitting: Physical Therapy

## 2017-11-06 DIAGNOSIS — M546 Pain in thoracic spine: Secondary | ICD-10-CM | POA: Insufficient documentation

## 2017-11-06 DIAGNOSIS — M542 Cervicalgia: Secondary | ICD-10-CM | POA: Insufficient documentation

## 2017-11-06 DIAGNOSIS — R252 Cramp and spasm: Secondary | ICD-10-CM | POA: Insufficient documentation

## 2017-11-06 DIAGNOSIS — M25511 Pain in right shoulder: Secondary | ICD-10-CM | POA: Insufficient documentation

## 2017-11-06 DIAGNOSIS — M25512 Pain in left shoulder: Secondary | ICD-10-CM | POA: Insufficient documentation

## 2017-11-07 IMAGING — MR MR HEAD WO/W CM
11 of 15 series · 30 of 48 positions shown · IV contrast (multihance)
Comparison: None.

CLINICAL DATA: Migraines with left-sided weakness. Concern for
multiple sclerosis.

EXAM:
MRI HEAD WITHOUT AND WITH CONTRAST
TECHNIQUE: Multiplanar, multiecho pulse sequences of the brain and surrounding
structures were obtained without and with intravenous contrast.
CONTRAST:  15mL MULTIHANCE GADOBENATE DIMEGLUMINE 529 MG/ML IV SOLN

[Series 3: T1 · sagittal · 5.0mm · 0.47mm/px · 2 of 24 slices shown]
[im 1/24]
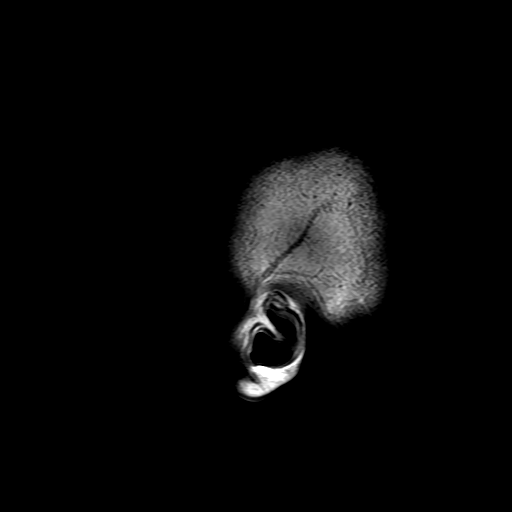
[im 24/24]
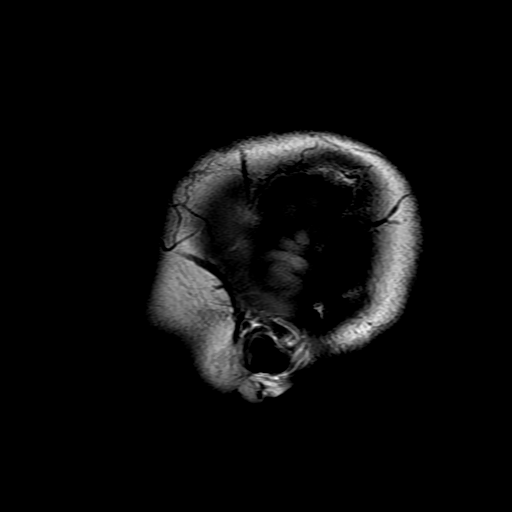

[Series 4: DWI · axial · 3.0mm · 1.09mm/px · z∈[-134,+4]mm · 6 of 108 slices shown (1 of 4)]
[im 1/108]
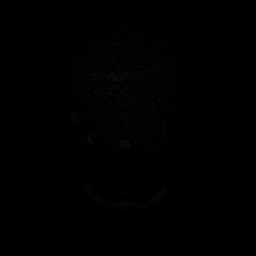
[im 22/108]
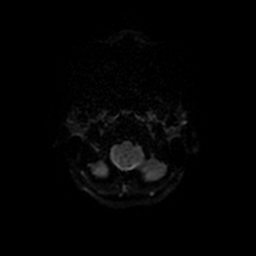
[im 43/108]
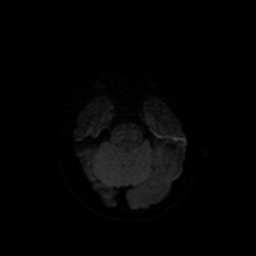
[im 65/108]
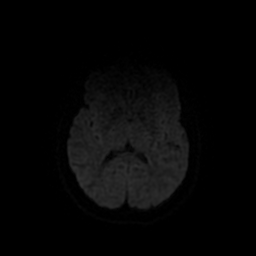
[im 86/108]
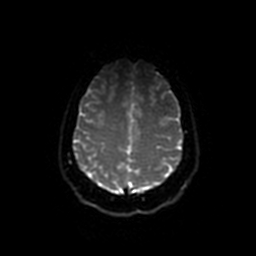
[im 108/108]
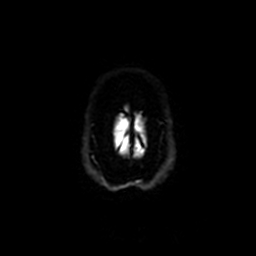

[Series 5: DWI · coronal · 5.0mm · 1.09mm/px · 4 of 68 slices shown (2 of 4)]
[im 1/68]
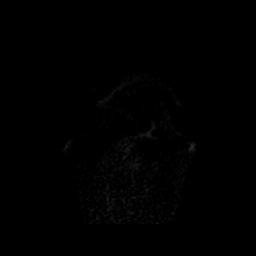
[im 23/68]
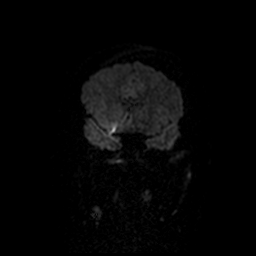
[im 45/68]
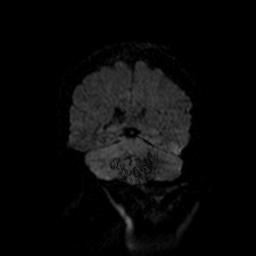
[im 68/68]
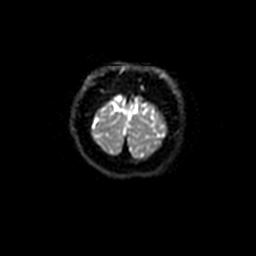

[Series 6: T2 · axial · 5.0mm · 0.43mm/px · 1 of 24 slices shown]
[im 1/24]
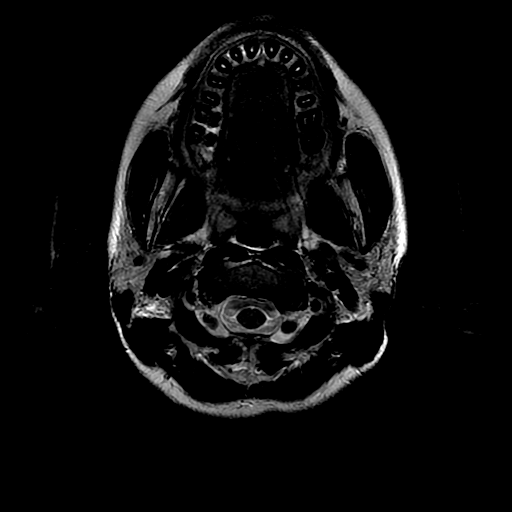

[Series 7: FLAIR · axial · 5.0mm · 0.43mm/px · 1 of 24 slices shown (1 of 2)]
[im 1/24]
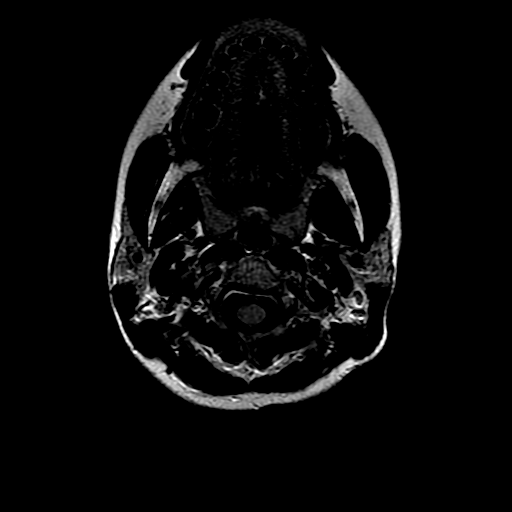

[Series 10: FLAIR · sagittal · 1.6mm · 0.47mm/px · 8 of 192 slices shown (2 of 2)]
[im 1/192]
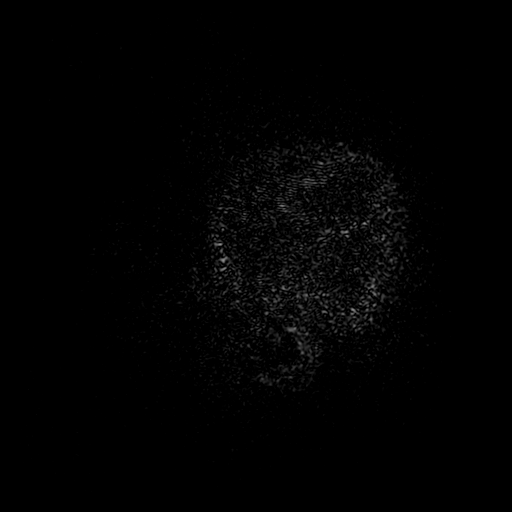
[im 22/192]
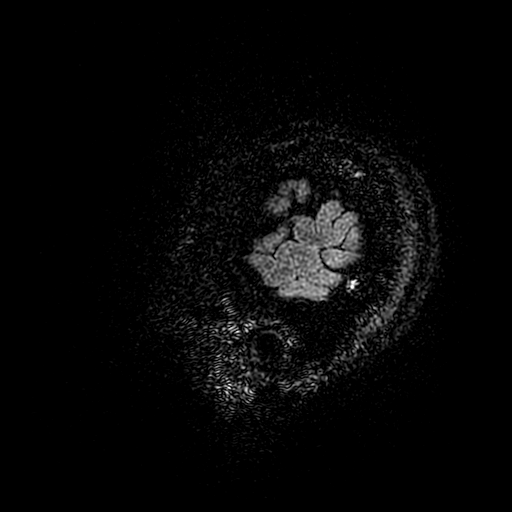
[im 64/192]
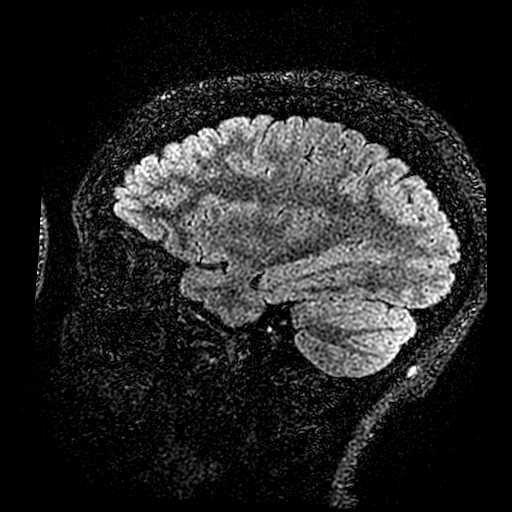
[im 85/192]
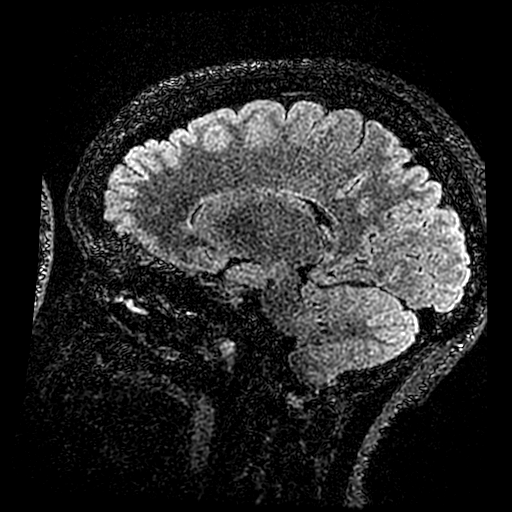
[im 107/192]
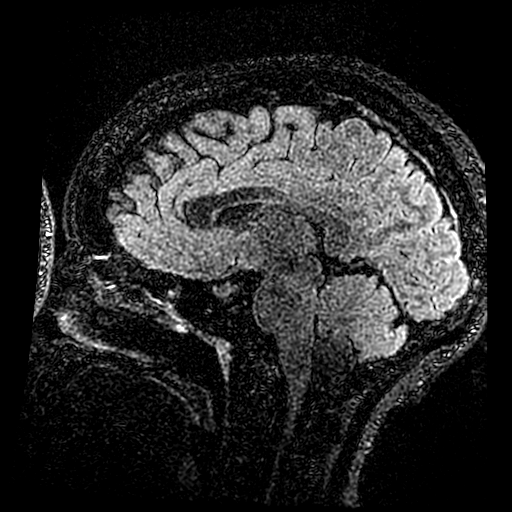
[im 128/192]
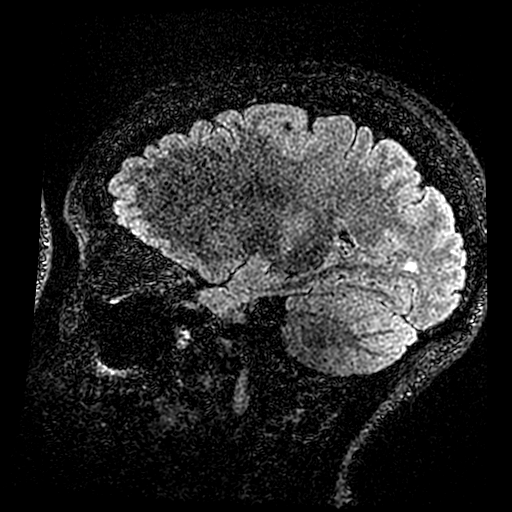
[im 170/192]
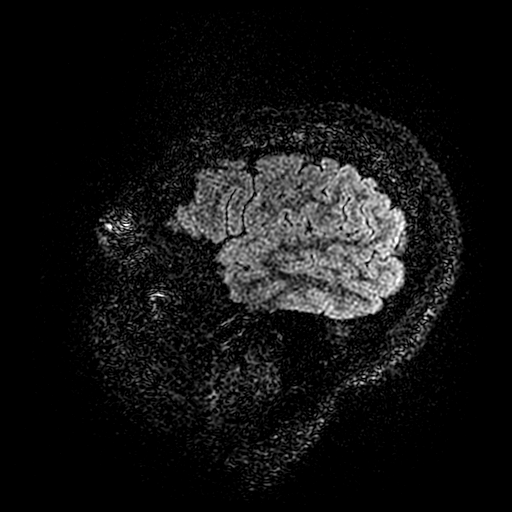
[im 192/192]
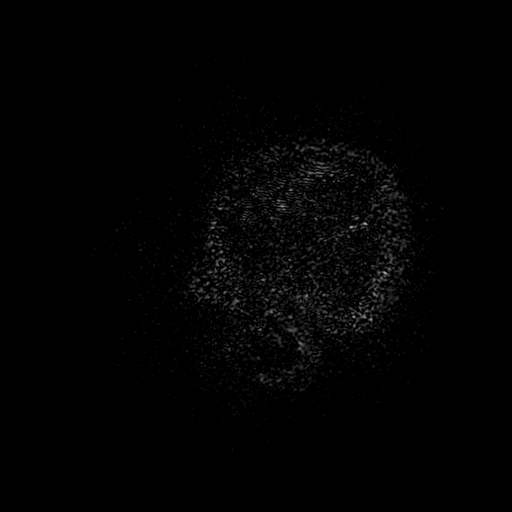

[Series 12: T2 post-contrast · coronal · 5.0mm · 0.47mm/px · 1 of 24 slices shown]
[im 1/24]
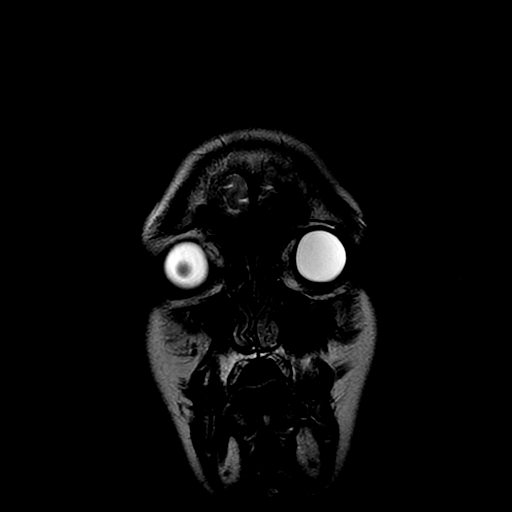

[Series 14: T1 post-contrast · coronal · 5.0mm · 0.47mm/px · 1 of 24 slices shown (1 of 2)]
[im 1/24]
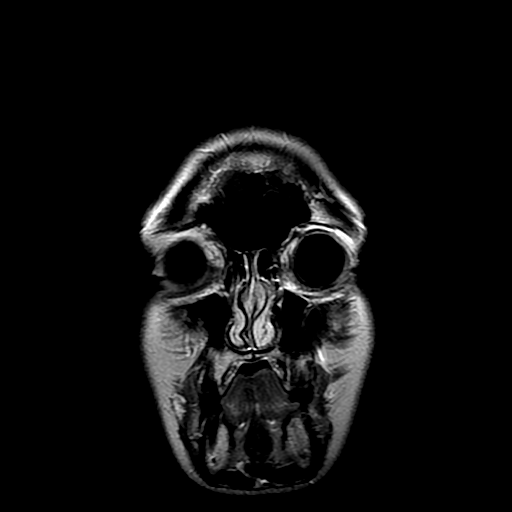

[Series 15: T1 post-contrast · sagittal · 5.0mm · 0.47mm/px · 1 of 24 slices shown (2 of 2)]
[im 1/24]
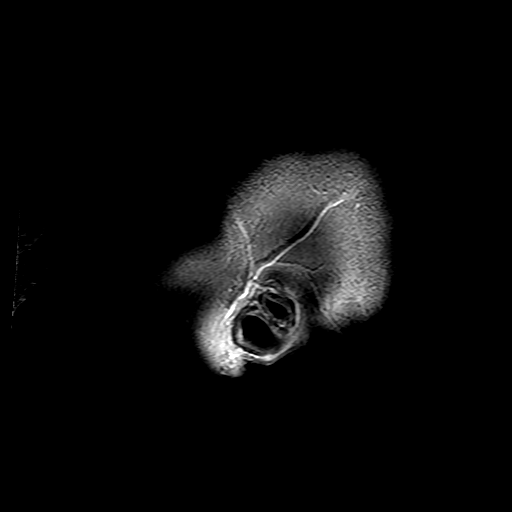

[Series 400: DWI · axial · 3.0mm · 1.09mm/px · z∈[-134,+4]mm · 3 of 54 slices shown (3 of 4)]
[im 1/54]
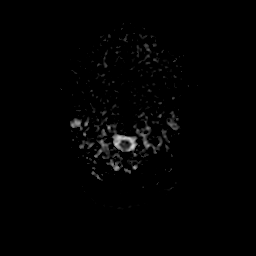
[im 27/54]
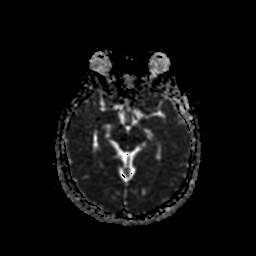
[im 54/54]
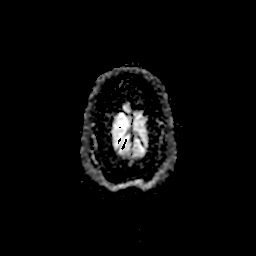

[Series 500: DWI · coronal · 5.0mm · 1.09mm/px · 2 of 34 slices shown (4 of 4)]
[im 1/34]
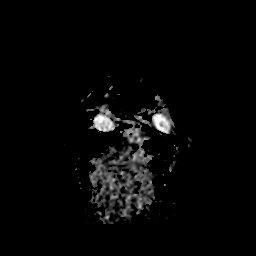
[im 34/34]
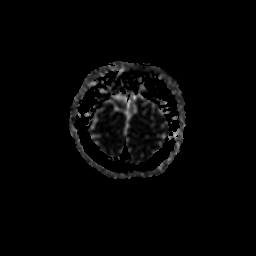

[30 of 48 positions shown; findings below may reference images not displayed]

FINDINGS: Brain: No acute infarction, hemorrhage, hydrocephalus, extra-axial
collection or mass lesion. No white matter disease or atrophy.

Low cerebellar tonsils, 12 mm below the foramen magnum, tips at the
superior margin of the C1 posterior ring. No notable pointing. No
upper cord syrinx.

Vascular: Normal flow voids.

Skull and upper cervical spine: Normal marrow signal. Mildly
hypoplastic clivus.

Sinuses/Orbits: Negative.
IMPRESSION: 1. No explanation for left-sided weakness.  No white matter disease.
2. Low cerebellar tonsils (12 mm below the foramen magnum) without
notable foramen magnum stenosis. Correlate for Chiari 1 symptoms.

## 2017-11-11 ENCOUNTER — Ambulatory Visit: Payer: Medicaid Other

## 2017-11-11 ENCOUNTER — Other Ambulatory Visit: Payer: Self-pay

## 2017-11-11 DIAGNOSIS — M546 Pain in thoracic spine: Secondary | ICD-10-CM | POA: Diagnosis not present

## 2017-11-11 DIAGNOSIS — M25512 Pain in left shoulder: Secondary | ICD-10-CM

## 2017-11-11 DIAGNOSIS — M25511 Pain in right shoulder: Secondary | ICD-10-CM | POA: Diagnosis not present

## 2017-11-11 DIAGNOSIS — M542 Cervicalgia: Secondary | ICD-10-CM

## 2017-11-11 DIAGNOSIS — R252 Cramp and spasm: Secondary | ICD-10-CM | POA: Diagnosis not present
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
## 2017-11-18 ENCOUNTER — Emergency Department (HOSPITAL_COMMUNITY)
Admission: EM | Admit: 2017-11-18 | Discharge: 2017-11-18 | Disposition: A | Discharge status: Home / Self Care | Payer: Medicaid Other | Attending: Emergency Medicine | Admitting: Emergency Medicine

## 2017-11-18 ENCOUNTER — Encounter (HOSPITAL_COMMUNITY): Payer: Self-pay | Admitting: Emergency Medicine

## 2017-11-18 ENCOUNTER — Other Ambulatory Visit: Payer: Self-pay

## 2017-11-18 DIAGNOSIS — K0889 Other specified disorders of teeth and supporting structures: Secondary | ICD-10-CM | POA: Insufficient documentation

## 2017-11-18 DIAGNOSIS — Z87891 Personal history of nicotine dependence: Secondary | ICD-10-CM | POA: Diagnosis not present

## 2017-11-18 MED ORDER — HYDROCODONE-ACETAMINOPHEN 5-325 MG PO TABS
2.0000 | ORAL_TABLET | Freq: Once | ORAL | Status: AC
  Administered 2017-11-18: 2 via ORAL
  Filled 2017-11-18: qty 2

## 2017-11-18 MED ORDER — PENICILLIN V POTASSIUM 500 MG PO TABS: 500.0000 mg | ORAL_TABLET | Freq: Three times a day (TID) | ORAL | 0 refills | Status: DC

## 2017-11-18 MED ORDER — HYDROCODONE-ACETAMINOPHEN 5-325 MG PO TABS: 1.0000 | ORAL_TABLET | Freq: Four times a day (QID) | ORAL | 0 refills | Status: DC | PRN

## 2017-11-18 NOTE — Discharge Instructions (Addendum)
Penicillin as prescribed.  Hydrocodone as prescribed as needed for pain. ° °Follow-up with dentistry in the next 2 to 3 days. °

## 2017-11-18 NOTE — ED Provider Notes (Addendum)
[facility]-EMERGENCY DEPT Provider Note   CSN: [id number] Arrival date & time: 11/18/17  0443     History   Chief Complaint Chief Complaint  Patient presents with  . Dental Pain    HPI Suzanne Ellis is a 32 y.o. female.  Patient is a 32 year old female presenting with complaints of dental pain.  She reports pain to the right upper and lower molars that has worsened over the past 2 days.  She denies any injury or trauma.  She denies any fevers or chills.  She denies any difficulty breathing or swallowing.  The history is provided by the patient.  Dental Pain   This is a new problem. The current episode started 2 days ago. The problem occurs constantly. The problem has been rapidly worsening. The pain is severe. She has tried acetaminophen for the symptoms. The treatment provided no relief.    Past Medical History:  Diagnosis Date  . 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 CHLAMYDTRACHOMATIS INFECTION LOWER GU SITES 07/28/2008   Qualifier: Diagnosis of  By: Jake Shark RN, Amy    . No pertinent past medical history     Patient Active Problem List   Diagnosis Date Noted  . Concussion without loss of consciousness 11/03/2017  . Whiplash injury syndrome, subsequent encounter 11/03/2017  . Acute pain of both shoulders 10/28/2017  . Abnormal Pap smear of cervix 08/23/2017  . Abdominal pain, chronic, right lower quadrant 08/23/2017  . 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 Migraine with aura 12/07/2015  . Unspecified episodic mood disorder 10/28/2013  . Recurrent bacterial vaginosis 05/22/2013  . Bipolar disorder (HCC) 01/19/2013  . Vaginal discharge 09/19/2011  . MIGRAINE WITHOUT AURA 09/14/2009  . PTSD 08/18/2009  . History of chronic PID 01/06/2009  . TOBACCO DEPENDENCE 05/16/2006  . DEPRESSIVE DISORDER, NOS 05/16/2006    Past Surgical History:  Procedure Laterality Date  . ARM WOUND REPAIR / CLOSURE    . DILATION AND  CURETTAGE OF UTERUS    . termination    . WISDOM TOOTH EXTRACTION       OB History    Gravida  5   Para  3   Term  3   Preterm  0   AB  2   Living  3     SAB  0   TAB  2   Ectopic  0   Multiple  0   Live Births  3            Home Medications    Prior to Admission medications   Medication Sig Start Date End Date Taking? Authorizing Provider  baclofen (LIORESAL) 10 MG tablet Take 1 tablet (10 mg total) by mouth 3 (three) times daily. 10/25/17   Marthenia Rolling, DO  ibuprofen (ADVIL,MOTRIN) 200 MG tablet Take 400 mg by mouth every 6 (six) hours as needed for moderate pain.     [provider]  medroxyPROGESTERone (DEPO-PROVERA) 150 MG/ML injection Inject 150 mg into the muscle every 3 (three) months.    [provider]  Multiple Vitamin (MULTIVITAMIN) tablet Take 1 tablet by mouth daily.    [provider]  naproxen (NAPROSYN) 500 MG tablet Take 1 tablet (500 mg total) by mouth 2 (two) times daily as needed for mild pain, moderate pain or headache (TAKE WITH MEALS.). 10/20/17   Street, [place], PA-C    Family History Family History  Problem Relation Age of Onset  . Hypertension Mother   .  Cancer Mother        lung  . Migraines Mother   . Anesthesia problems Other        MGF allergic to a common type of anesthesia  . Cancer Maternal Grandmother        skin  . Diabetes Maternal Grandmother   . Migraines Sister   . Seizures Sister   . Hypotension Neg Hx   . Malignant hyperthermia Neg Hx   . Pseudochol deficiency Neg Hx     Social History Social History   Tobacco Use  . Smoking status: Former Smoker    Packs/day: 0.50    Years: 10.00    Pack years: 5.00    Types: Cigarettes  . Smokeless tobacco: Never Used  Substance Use Topics  . Alcohol use: Yes    Comment: occasionally   . Drug use: No     Allergies   Lamictal [lamotrigine]   Review of Systems Review of Systems  All other systems reviewed and are  negative.    Physical Exam Updated Vital Signs BP 135/83 (BP Location: Left Arm)   Pulse 90   Temp 98.9 F (37.2 C) (Oral)   Resp 16   Ht 5\' 5"  (1.651 m)   Wt 77.1 kg   SpO2 99%   BMI 28.29 kg/m   Physical Exam  Constitutional: She is oriented to person, place, and time. She appears well-developed and well-nourished. No distress.  HENT:  Head: Normocephalic and atraumatic.  There is a heavily decayed right upper molar with surrounding gingival inflammation, however no obvious abscess.  Neck: Normal range of motion. Neck supple.  Pulmonary/Chest: Effort normal.  Neurological: She is alert and oriented to person, place, and time.  Skin: Skin is warm and dry. She is not diaphoretic.  Nursing note and vitals reviewed.    ED Treatments / Results  Labs (all labs ordered are listed, but only abnormal results are displayed) Labs Reviewed - No data to display  EKG None  Radiology No results found.  Procedures Procedures (including critical care time)  Medications Ordered in ED Medications - No data to display   Initial Impression / Assessment and Plan / ED Course  I have reviewed the triage vital signs and the nursing notes.  Pertinent labs & imaging results that were available during my care of the patient were reviewed by me and considered in my medical decision making (see chart for details).  Patient will be prescribed antibiotics, pain medicine, and is to follow-up with dentistry.    When patient was being discharged, she began cussing and swearing about not receiving pain medicine in the ER.  I was reluctant to medicate her here because she told me her sister was driving, however there was no family present and she had her car keys in her lap during the encounter.  Final Clinical Impressions(s) / ED Diagnoses   Final diagnoses:  None    ED Discharge Orders    None       Geoffery Lyons, MD 11/18/17 6237    Geoffery Lyons, MD 11/18/17 [phone]

## 2017-11-18 NOTE — ED Notes (Signed)
ED Provider at bedside. 

## 2017-11-18 NOTE — ED Notes (Signed)
Pt upset that medication might make her nauseous, pt given crackers per request to have something on her stomach

## 2017-11-18 NOTE — ED Notes (Signed)
Pt given ice pack per request. 

## 2017-11-18 NOTE — ED Triage Notes (Signed)
Pt reports having dental pain on right side for the last 2 days and has kept getting worse.

## 2017-11-18 NOTE — ED Notes (Addendum)
When attempting to discharge pt, pt asked, "am I not getting any pain medicine?" This nurse explained yes there is a prescription for you to pick it up at the pharmacy. Pt yelled "I did not come here for no f*ing reason, obviously I need something for pain now!" This nurse reassured the pt that I would find out if we can give her some before she goes, asked if she was driving home. Pt yells "I f*ing told him that Im not driving my sister is taking me home!"  Dr made aware.

## 2017-11-19 ENCOUNTER — Ambulatory Visit (INDEPENDENT_AMBULATORY_CARE_PROVIDER_SITE_OTHER): Payer: Medicaid Other | Admitting: Family Medicine

## 2017-11-19 ENCOUNTER — Encounter: Payer: Self-pay | Admitting: Family Medicine

## 2017-11-19 VITALS — BP 100/80 | HR 88 | Temp 99.1°F | Wt 167.8 lb

## 2017-11-19 DIAGNOSIS — K029 Dental caries, unspecified: Secondary | ICD-10-CM | POA: Diagnosis not present

## 2017-11-19 MED ORDER — NAPROXEN 500 MG PO TABS: 500.0000 mg | ORAL_TABLET | Freq: Two times a day (BID) | ORAL | 0 refills | Status: DC

## 2017-11-19 MED ORDER — AMOXICILLIN-POT CLAVULANATE 875-125 MG PO TABS: 1.0000 | ORAL_TABLET | Freq: Two times a day (BID) | ORAL | 0 refills | Status: DC

## 2017-11-19 NOTE — Progress Notes (Signed)
   Subjective   Patient ID: Suzanne Ellis    DOB: 08-Jun-1985, 32 y.o. female   MRN: [id number]  CC: "Swollen face"  HPI: Suzanne Ellis is a 32 y.o. female who presents for a same day appointment for the following:  Dental pain: Patient is here today for follow-up regarding dental pain.  Onset 3 days ago on right upper maxilla with throbbing pain which has worsened over the last 24 hours.  Patient was having difficulty sleeping and went to the [facility] ED on 9/2 and was given penicillin and oxycodone for pain.  Patient has taken 4 tablets of the penicillin but reports significant increase in swelling since last night.  She has some subjective fevers but denies chills, dysphagia, odynophagia, sore throat, shortness of breath.  She did have a few episodes of vomiting which have resolved.  She has a history of dental pain and has seen a dentist about a year ago.  She does report brushing her teeth twice daily.  ROS: see HPI for pertinent.  PMFSH: Smoker, bipolar, depression, migraines, chronic PID, Arnold-Chiari malformation type I.  Surgical history D&C, arm, wisdom tooth extraction.  Family history HTN, migraines, cancer (mother, lung), seizures.  Smoking status reviewed. Medications reviewed.  Objective   BP 100/80   Pulse 88   Temp 99.1 F (37.3 C)   Wt 167 lb 12.8 oz (76.1 kg)   SpO2 99%   BMI 27.92 kg/m  Vitals and nursing note reviewed.  General: well nourished, well developed, NAD with non-toxic appearance HEENT: normocephalic, atraumatic, moist mucous membranes, maximal opening of jaw 2.5 cm, diffuse gum inflammation with moderate tenderness to right maxilla and some associated swelling without purulence, moderate edema localized to right maxilla and inferior orbital region, PERRLA, EOMI, no proptosis Neck: supple, non-tender without cervical lymphadenopathy Cardiovascular: regular rate and rhythm without murmurs, rubs, or gallops Lungs: clear to auscultation bilaterally with normal  work of breathing Skin: warm, dry, no rashes or lesions, cap refill < 2 seconds Extremities: warm and well perfused, normal tone, no edema  Assessment & Plan   Pain due to dental caries Acute.  Patient has poor dentition.  Does have localized edema sparing orbit.  No red flag symptoms.  Patient appears to have not responded well to the penicillin the medication was initiated day prior to presentation. - Instructed to discontinue penicillin, given Augmentin 875-125 mg twice daily for 10 days - Given naproxen 500 mg twice daily as needed for pain - Instructed patient to contact dentist, given form with local dental offices - Reviewed return precautions  No orders of the defined types were placed in this encounter.  Meds ordered this encounter  Medications  . amoxicillin-clavulanate (AUGMENTIN) 875-125 MG tablet    Sig: Take 1 tablet by mouth 2 (two) times daily.    Dispense:  20 tablet    Refill:  0  . naproxen (NAPROSYN) 500 MG tablet    Sig: Take 1 tablet (500 mg total) by mouth 2 (two) times daily with a meal.    Dispense:  30 tablet    Refill:  0    Durward Parcel, DO [facility], PGY-3 11/19/2017, 3:15 PM

## 2017-11-19 NOTE — Patient Instructions (Signed)
Thank you for coming in to see [place] today. Please see below to review our plan for today's visit.  You do have signs of a dental infection.  I would like to give a stronger antibiotic to make sure the swelling improves.  This medication is called Augmentin and you will take this twice daily for 10 days.  Eat something approximately 20 minutes after taking the medication to prevent loose stools and upset stomach.  Swelling should improve over the next 24-48 hours.  If you have any difficulty swallowing, breathing, or opening her mouth, go to the emergency room immediately.  I have given you a form with some phone numbers.  Ultimately you should see a dentist to have your tooth evaluated.  For pain, I have given you naproxen which you can take twice daily as needed for pain.  Please call the clinic at [phone] if your symptoms worsen or you have any concerns. It was our pleasure to serve you.  Durward Parcel, DO [facility], PGY-3

## 2017-11-19 NOTE — Assessment & Plan Note (Signed)
Acute.  Patient has poor dentition.  Does have localized edema sparing orbit.  No red flag symptoms.  Patient appears to have not responded well to the penicillin the medication was initiated day prior to presentation. - Instructed to discontinue penicillin, given Augmentin 875-125 mg twice daily for 10 days - Given naproxen 500 mg twice daily as needed for pain - Instructed patient to contact dentist, given form with local dental offices - Reviewed return precautions

## 2017-11-20 ENCOUNTER — Ambulatory Visit: Payer: Medicaid Other | Attending: Family Medicine | Admitting: Physical Therapy

## 2017-11-20 ENCOUNTER — Telehealth: Payer: Self-pay | Admitting: Physical Therapy

## 2017-11-20 DIAGNOSIS — R252 Cramp and spasm: Secondary | ICD-10-CM | POA: Insufficient documentation

## 2017-11-20 DIAGNOSIS — M25511 Pain in right shoulder: Secondary | ICD-10-CM | POA: Insufficient documentation

## 2017-11-20 DIAGNOSIS — M542 Cervicalgia: Secondary | ICD-10-CM | POA: Insufficient documentation

## 2017-11-20 DIAGNOSIS — M25512 Pain in left shoulder: Secondary | ICD-10-CM | POA: Insufficient documentation

## 2017-11-20 DIAGNOSIS — M546 Pain in thoracic spine: Secondary | ICD-10-CM | POA: Insufficient documentation

## 2017-11-20 NOTE — Telephone Encounter (Signed)
Called patient about her missing her appointment at 8:00AM. Left a message.  Eulis Foster, PT @9 /06/2017@ 8:19 AM

## 2017-11-27 ENCOUNTER — Ambulatory Visit: Payer: Medicaid Other

## 2017-11-27 DIAGNOSIS — M542 Cervicalgia: Secondary | ICD-10-CM | POA: Diagnosis not present

## 2017-11-27 DIAGNOSIS — M25512 Pain in left shoulder: Secondary | ICD-10-CM | POA: Diagnosis not present

## 2017-11-27 DIAGNOSIS — M546 Pain in thoracic spine: Secondary | ICD-10-CM | POA: Diagnosis not present

## 2017-11-27 DIAGNOSIS — M25511 Pain in right shoulder: Secondary | ICD-10-CM

## 2017-11-27 DIAGNOSIS — R252 Cramp and spasm: Secondary | ICD-10-CM
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
## 2017-11-27 NOTE — Patient Instructions (Addendum)
Cervico-Thoracic: Extension / Rotation (Sitting)    Reach across body with left arm and grasp back of chair. Gently look over right side shoulder. Hold _20___ seconds. Relax. Repeat __3__ times per set. Do ___1_ sets per session. Do __3__ sessions per day.  [email or web address]   Access Code: [email or web address]  URL: [email or web address]  Date: 11/27/2017  Prepared by: Lorrene Reid   Exercises  Seated Cervical Flexion AROM - 3 reps - 1 sets - 20 hold - 4x daily - 7x weekly  Seated Cervical Sidebending AROM - 3 reps - 1 sets - 20 hold - 4x daily - 7x weekly  Seated Cervical Rotation AROM - 3 reps - 1 sets - 20 hold - 4x daily - 7x weekly  Seated Correct Posture - 10 reps - 3 sets - 1x daily - 7x weekly  Sidelying Open Book Thoracic Rotation with Knee on Foam Roll - 10 reps - 1 sets - 2x daily - 7x weekly  Standing shoulder flexion wall slides - 10 reps - 1 sets - 5 hold - 2x daily - 7x weekly    Copyright  VHI. All rights reserved.

## 2017-12-04 ENCOUNTER — Ambulatory Visit: Payer: Medicaid Other | Admitting: Physical Therapy

## 2017-12-04 ENCOUNTER — Telehealth: Payer: Self-pay | Admitting: Physical Therapy
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
## 2017-12-19 ENCOUNTER — Ambulatory Visit: Payer: Medicaid Other | Attending: Family Medicine

## 2017-12-19 DIAGNOSIS — M25511 Pain in right shoulder: Secondary | ICD-10-CM

## 2017-12-19 DIAGNOSIS — M546 Pain in thoracic spine: Secondary | ICD-10-CM | POA: Insufficient documentation

## 2017-12-19 DIAGNOSIS — M542 Cervicalgia: Secondary | ICD-10-CM | POA: Insufficient documentation

## 2017-12-19 DIAGNOSIS — R252 Cramp and spasm: Secondary | ICD-10-CM | POA: Diagnosis not present

## 2017-12-19 DIAGNOSIS — M25512 Pain in left shoulder: Secondary | ICD-10-CM | POA: Insufficient documentation
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
## 2017-12-24 ENCOUNTER — Ambulatory Visit: Payer: Medicaid Other

## 2017-12-24 DIAGNOSIS — M542 Cervicalgia: Secondary | ICD-10-CM | POA: Diagnosis not present

## 2017-12-24 DIAGNOSIS — M25512 Pain in left shoulder: Secondary | ICD-10-CM | POA: Diagnosis not present

## 2017-12-24 DIAGNOSIS — R252 Cramp and spasm: Secondary | ICD-10-CM

## 2017-12-24 DIAGNOSIS — M546 Pain in thoracic spine: Secondary | ICD-10-CM | POA: Diagnosis not present

## 2017-12-24 DIAGNOSIS — M25511 Pain in right shoulder: Secondary | ICD-10-CM | POA: Diagnosis not present

## 2017-12-24 NOTE — Therapy (Addendum)
[facility] [address] [place], [place], [address] Phone: [phone]   Fax:  [phone]  Physical Therapy Treatment  Patient Details  Name: Suzanne Ellis MRN: [id number] Date of Birth: 1985/05/23 Referring Provider (PT): Terisa Starr, MD   Encounter Date: 12/24/2017  PT End of Session - 12/24/17 0803    Visit Number  4    Date for PT Re-Evaluation  01/06/18    Authorization Type  Medicaid: 12 visits 9/16-10/27    Authorization - Visit Number  2    Authorization - Number of Visits  12    PT Start Time  0730    PT Stop Time  0815    PT Time Calculation (min)  45 min    Activity Tolerance  Patient tolerated treatment well    Behavior During Therapy  [facility] for tasks assessed/performed       Past Medical History:  Diagnosis Date  . 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 CHLAMYDTRACHOMATIS INFECTION LOWER GU SITES 07/28/2008   Qualifier: Diagnosis of  By: Jake Shark RN, Amy    . No pertinent past medical history     Past Surgical History:  Procedure Laterality Date  . ARM WOUND REPAIR / CLOSURE    . DILATION AND CURETTAGE OF UTERUS    . termination    . WISDOM TOOTH EXTRACTION      There were no vitals filed for this visit.  Subjective Assessment - 12/24/17 0734    Subjective  I am getting a headache this morning.      Pain Score  6     Pain Location  Neck    Pain Orientation  Right;Left    Pain Descriptors / Indicators  Aching;Dull;Headache    Pain Type  Chronic pain    Pain Onset  More than a month ago    Pain Frequency  Intermittent    Aggravating Factors   morning hours, lying down, use of arms    Pain Relieving Factors  change of postion, pain medication, sleep                       [facility] Adult PT Treatment/Exercise - 12/24/17 0001      Moist Heat Therapy   Number Minutes Moist Heat  15 Minutes    Moist Heat Location  Cervical      Electrical Stimulation   Electrical Stimulation Location  neck and upper traps    Electrical Stimulation Action  IFC    Electrical Stimulation Parameters  15 minutes    Electrical Stimulation Goals  Pain      Manual Therapy   Manual Therapy  Soft tissue mobilization;Myofascial release    Manual therapy comments  soft tissue elongation and trigger point release to bil suboccipitals, cervical paraspinals and upper traps       Trigger Point Dry Needling - 12/24/17 0737    Consent Given?  Yes    Education Handout Provided  Yes    Muscles Treated Upper Body  Upper trapezius;Oblique capitus;Suboccipitals muscle group    Upper Trapezius Response  Twitch reponse elicited;Palpable increased muscle length    Oblique Capitus Response  --    SubOccipitals Response  Twitch response elicited;Palpable increased muscle length           PT Education - 12/24/17 0816    Education Details  DN info    Person(s) Educated  Patient    Methods  Explanation;Handout  Comprehension  Verbalized understanding;Returned demonstration       PT Short Term Goals - 12/19/17 0736      PT SHORT TERM GOAL #1   Title  be independent in initial HEP    Time  4    Period  Weeks    Status  On-going      PT SHORT TERM GOAL #2   Title  reporta a 25% reduction in frequency and intensity of headaches    Baseline  increased frequency over the past week- no intermittent    Time  4    Period  Weeks    Status  On-going      PT SHORT TERM GOAL #3   Title  sit at work with < or = to 4/10 neck and shoulder pain    Baseline  7/10     Time  4    Period  Weeks    Status  On-going      PT SHORT TERM GOAL #4   Title  demonstrate > or = to 140 degrees of bilateral shoulder flexion to improve reaching    Baseline  Rt 155, Lt 150    Status  Achieved        PT Long Term Goals - 11/27/17 1610      PT LONG TERM GOAL #1   Title  be independent in advanced HEP    Time  8    Period  Weeks    Status  On-going      PT LONG TERM GOAL #2    Title  report 75% reduction in frequency and intensity of headaches    Baseline  frequency reduced by 10%    Time  8    Period  Weeks    Status  On-going      PT LONG TERM GOAL #3   Title  demonstrate > or = to 160 degrees bil shoulder flexion 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  Increased headache over the past few days and reports compliance with HEP.  Pt with tension and trigger points in bil neck, upper traps and suboccipitals and demonstrated improved tissue mobility after dry needling although remained sensitive.  Pt will continue to benefit from skilled PT for gentle mobility, flexibility and modalities/manual as needed.     Rehab Potential  Excellent    PT Frequency  2x / week    PT Duration  8 weeks    PT  Treatment/Interventions  ADLs/Self Care Home Management;Cryotherapy;Electrical Stimulation;Ultrasound;Traction;Moist Heat;Gait training;Functional mobility training;Therapeutic activities;Therapeutic exercise;Patient/family education;Neuromuscular re-education;Manual techniques;Passive range of motion;Taping;Dry needling    PT Next Visit Plan  assess response to dry needling to cervical and thoracic spine, modalities, postural strength    Consulted and Agree with Plan of Care  Patient       Patient will benefit from skilled therapeutic intervention in order to improve the  following deficits and impairments:  Improper body mechanics, Pain, Postural dysfunction, Increased muscle spasms, Decreased activity tolerance, Hypomobility, Impaired flexibility, Impaired UE functional use, Decreased mobility  Visit Diagnosis: Acute pain of left shoulder  Acute pain of right shoulder  Cramp and spasm  Cervicalgia  Pain in thoracic spine     Problem List Patient Active Problem List   Diagnosis Date Noted  . Pain due to dental caries 11/19/2017  . Concussion without loss of consciousness 11/03/2017  . Whiplash injury syndrome, subsequent encounter 11/03/2017  . Acute pain of both shoulders 10/28/2017  . Abnormal Pap smear of cervix 08/23/2017  . Abdominal pain, chronic, right lower quadrant 08/23/2017  . 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 Migraine with aura 12/07/2015  . Unspecified episodic mood disorder 10/28/2013  . Recurrent bacterial vaginosis 05/22/2013  . Bipolar disorder (HCC) 01/19/2013  . Vaginal discharge 09/19/2011  . MIGRAINE WITHOUT AURA 09/14/2009  . PTSD 08/18/2009  . History of chronic PID 01/06/2009  . TOBACCO DEPENDENCE 05/16/2006  . DEPRESSIVE DISORDER, NOS 05/16/2006     Lorrene Reid, PT 12/24/17 8:17 AM  [facility] [address], [address] [place], [place], [address] Phone: [phone]   Fax:  [phone]  Name: COURTLAND COPPA MRN: [id number] Date of Birth: [birth year]

## 2017-12-24 NOTE — Patient Instructions (Signed)

## 2017-12-26 ENCOUNTER — Ambulatory Visit: Payer: Medicaid Other

## 2017-12-26 DIAGNOSIS — M25511 Pain in right shoulder: Secondary | ICD-10-CM | POA: Diagnosis not present

## 2017-12-26 DIAGNOSIS — M546 Pain in thoracic spine: Secondary | ICD-10-CM

## 2017-12-26 DIAGNOSIS — M542 Cervicalgia: Secondary | ICD-10-CM

## 2017-12-26 DIAGNOSIS — R252 Cramp and spasm: Secondary | ICD-10-CM | POA: Diagnosis not present

## 2017-12-26 DIAGNOSIS — M25512 Pain in left shoulder: Secondary | ICD-10-CM

## 2017-12-26 NOTE — Therapy (Signed)
[facility] [address] [place], [place], [address] Phone: [phone]   Fax:  [phone]  Physical Therapy Treatment  Patient Details  Name: Suzanne Ellis MRN: [id number] Date of Birth: 1985-08-17 Referring Provider (PT): Terisa Starr, MD   Encounter Date: 12/26/2017  PT End of Session - 12/26/17 0800    Visit Number  5    Date for PT Re-Evaluation  01/06/18    Authorization Type  Medicaid: 12 visits 9/16-10/27    Authorization - Visit Number  3    Authorization - Number of Visits  12    PT Start Time  0734    PT Stop Time  0821    PT Time Calculation (min)  47 min    Activity Tolerance  Patient tolerated treatment well    Behavior During Therapy  [facility] for tasks assessed/performed       Past Medical History:  Diagnosis Date  . 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 CHLAMYDTRACHOMATIS INFECTION LOWER GU SITES 07/28/2008   Qualifier: Diagnosis of  By: Jake Shark RN, Amy    . No pertinent past medical history     Past Surgical History:  Procedure Laterality Date  . ARM WOUND REPAIR / CLOSURE    . DILATION AND CURETTAGE OF UTERUS    . termination    . WISDOM TOOTH EXTRACTION      There were no vitals filed for this visit.  Subjective Assessment - 12/26/17 0738    Subjective  My headache got worse after dry needling.  Now I don't have any pain- just soreness.      Currently in Pain?  Yes    Pain Score  1     Pain Location  Neck    Pain Orientation  Right;Left                       [facility] Adult PT Treatment/Exercise - 12/26/17 0001      Exercises   Exercises  Shoulder;Neck      Shoulder Exercises: Supine   Horizontal ABduction  Strengthening;Both;20 reps;Theraband    Theraband Level (Shoulder Horizontal ABduction)  Level 1 (Yellow)    External Rotation  Strengthening;Both;20 reps    Theraband Level (Shoulder External Rotation)  Level 1 (Yellow)      Shoulder  Exercises: Seated   Flexion  Strengthening;Both;20 reps;Weights    Flexion Weight (lbs)  1    Other Seated Exercises  seated thoracothoracic rotation: 3x20 seconds      Shoulder Exercises: Sidelying   Other Sidelying Exercises  open book x 10 each      Shoulder Exercises: Standing   Extension  Strengthening;Both;20 reps;Theraband    Theraband Level (Shoulder Extension)  Level 1 (Yellow)    Row  Strengthening;Both;20 reps;Theraband    Theraband Level (Shoulder Row)  Level 1 (Yellow)      Moist Heat Therapy   Number Minutes Moist Heat  15 Minutes    Moist Heat Location  Cervical      Electrical Stimulation   Electrical Stimulation Location  neck and upper traps    Electrical Stimulation Action  IFC    Electrical Stimulation Parameters  15 minutes    Electrical Stimulation Goals  Pain      Neck Exercises: Stretches   Upper Trapezius Stretch  3 reps;20 seconds;Left;Right               PT Short Term Goals - 12/19/17 6962  PT SHORT TERM GOAL #1   Title  be independent in initial HEP    Time  4    Period  Weeks    Status  On-going      PT SHORT TERM GOAL #2   Title  reporta a 25% reduction in frequency and intensity of headaches    Baseline  increased frequency over the past week- no intermittent    Time  4    Period  Weeks    Status  On-going      PT SHORT TERM GOAL #3   Title  sit at work with < or = to 4/10 neck and shoulder pain    Baseline  7/10     Time  4    Period  Weeks    Status  On-going      PT SHORT TERM GOAL #4   Title  demonstrate > or = to 140 degrees of bilateral shoulder flexion to improve reaching    Baseline  Rt 155, Lt 150    Status  Achieved        PT Long Term Goals - 11/27/17 1610      PT LONG TERM GOAL #1   Title  be independent in advanced HEP    Time  8    Period  Weeks    Status  On-going      PT LONG TERM GOAL #2   Title  report 75% reduction in frequency and intensity of headaches    Baseline  frequency reduced by  10%    Time  8    Period  Weeks    Status  On-going      PT LONG TERM GOAL #3   Title  demonstrate > or = to 160 degrees bil shoulder flexion 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  Pt reports intermittent stretching at home and work.  Pt with improved mobility and ease of movement today with exercise.  Pt  tolerated supine strength exercises with some soreness reported in the thoracic spine and shoulders.  Pt requires tactile cues to reduce guarding with standing scapular strength exercises.  Pt with continued muscle pain and tension in the neck and thoracic spine and will continue to benefit from skilled PT for flexibility, strength, manual and modalities.      Rehab Potential  Excellent    PT Frequency  2x / week    PT Duration  8 weeks  PT Treatment/Interventions  ADLs/Self Care Home Management;Cryotherapy;Electrical Stimulation;Ultrasound;Traction;Moist Heat;Gait training;Functional mobility training;Therapeutic activities;Therapeutic exercise;Patient/family education;Neuromuscular re-education;Manual techniques;Passive range of motion;Taping;Dry needling    PT Next Visit Plan  thoracic mobs, modalities, postural strength    PT Home Exercise Plan  Access Code: [email or web address]    Consulted and Agree with Plan of Care  Patient       Patient will benefit from skilled therapeutic intervention  in order to improve the following deficits and impairments:  Improper body mechanics, Pain, Postural dysfunction, Increased muscle spasms, Decreased activity tolerance, Hypomobility, Impaired flexibility, Impaired UE functional use, Decreased mobility  Visit Diagnosis: Acute pain of left shoulder  Acute pain of right shoulder  Cramp and spasm  Cervicalgia  Pain in thoracic spine     Problem List Patient Active Problem List   Diagnosis Date Noted  . Pain due to dental caries 11/19/2017  . Concussion without loss of consciousness 11/03/2017  . Whiplash injury syndrome, subsequent encounter 11/03/2017  . Acute pain of both shoulders 10/28/2017  . Abnormal Pap smear of cervix 08/23/2017  . Abdominal pain, chronic, right lower quadrant 08/23/2017  . 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 Migraine with aura 12/07/2015  . Unspecified episodic mood disorder 10/28/2013  . Recurrent bacterial vaginosis 05/22/2013  . Bipolar disorder (HCC) 01/19/2013  . Vaginal discharge 09/19/2011  . MIGRAINE WITHOUT AURA 09/14/2009  . PTSD 08/18/2009  . History of chronic PID 01/06/2009  . TOBACCO DEPENDENCE 05/16/2006  . DEPRESSIVE DISORDER, NOS 05/16/2006     Lorrene Reid, PT 12/26/17 8:08 AM  [facility] [address], [address] [place], [place], [address] Phone: [phone]   Fax:  [phone]  Name: NYCHELLE CASSATA MRN: [id number] Date of Birth: 1986-02-20

## 2017-12-30 ENCOUNTER — Ambulatory Visit: Payer: Medicaid Other

## 2017-12-30 ENCOUNTER — Ambulatory Visit: Payer: Medicaid Other | Admitting: Physical Therapy

## 2018-01-01 ENCOUNTER — Other Ambulatory Visit: Payer: Self-pay

## 2018-01-01 ENCOUNTER — Ambulatory Visit (INDEPENDENT_AMBULATORY_CARE_PROVIDER_SITE_OTHER): Payer: Medicaid Other | Admitting: Family Medicine

## 2018-01-01 VITALS — BP 100/60 | HR 86 | Temp 98.6°F | Ht 65.0 in | Wt 168.0 lb

## 2018-01-01 DIAGNOSIS — B9689 Other specified bacterial agents as the cause of diseases classified elsewhere: Secondary | ICD-10-CM | POA: Diagnosis not present

## 2018-01-01 DIAGNOSIS — N760 Acute vaginitis: Secondary | ICD-10-CM

## 2018-01-01 LAB — POCT WET PREP (WET MOUNT)
CLUE CELLS WET PREP WHIFF POC: NEGATIVE
Trichomonas Wet Prep HPF POC: ABSENT

## 2018-01-01 MED ORDER — CLINDAMYCIN HCL 300 MG PO CAPS: 300.0000 mg | ORAL_CAPSULE | Freq: Two times a day (BID) | ORAL | 2 refills | Status: AC

## 2018-01-01 MED ORDER — FLUCONAZOLE 150 MG PO TABS: 150.0000 mg | ORAL_TABLET | Freq: Once | ORAL | 2 refills | Status: AC

## 2018-01-01 NOTE — Progress Notes (Signed)
  Subjective:   Patient ID: Suzanne Ellis    DOB: [birth year], 32 y.o. female   MRN: [id number]  Suzanne Ellis is a 32 y.o. female with a history of PID, frequent BV here for   Vaginal irritation - endorses vaginal irritation similar to previous BV. Has had BV frequently, about every couple of months. She has previously received flagyl and clindamycin in the past, prefers clindamycin as she feels it worked better for her. - wants to know about prophylaxis for BV. getting infections, will get yeast infections afterwards.  - She wears loose fitting clothing, cotton underwear, uses dove soap, and denies changes in detergents or douching.  Review of Systems:  Per HPI.  PMFSH, medications and smoking status reviewed.  Objective:   BP 100/60   Pulse 86   Temp 98.6 F (37 C) (Oral)   Ht 5\' 5"  (1.651 m)   Wt 168 lb (76.2 kg)   SpO2 96%   BMI 27.96 kg/m  Vitals and nursing note reviewed.  General: well nourished, well developed, in no acute distress with non-toxic appearance CV: regular rate  Lungs  normal work of breathing Skin: warm, dry, no rashes or lesions Extremities: warm and well perfused, normal tone MSK: ROM grossly intact, strength intact, gait normal Neuro: Alert and oriented, speech normal  Assessment & Plan:   Recurrent bacterial vaginosis Wet prep from self swab consistent with recurrent BV. Will prescribe clindamycin with refills. Encouraged continued supportive care measures as discussed above. Also discussed possible benefit of probiotics based on previous studies. Return precautions discussed.  Orders Placed This Encounter  Procedures  . POCT Wet Prep [facility])   Meds ordered this encounter  Medications  . clindamycin (CLEOCIN) 300 MG capsule    Sig: Take 1 capsule (300 mg total) by mouth 2 (two) times daily for 7 days.    Dispense:  14 capsule    Refill:  2  . fluconazole (DIFLUCAN) 150 MG tablet    Sig: Take 1 tablet (150 mg total) by mouth once for 1  dose.    Dispense:  1 tablet    Refill:  2    Ellwood Dense, DO PGY-2, [facility] 01/01/2018 4:31 PM

## 2018-01-01 NOTE — Patient Instructions (Signed)
It was great to see you!  Our plans for today:  - I am sending in an antibiotic for your BV. I gave you a few refills on this as well as diflucan. - You can try probiotics, specifically "Lactobacillus rhamnosus" to help try to prevent getting BV in the future, there has been some studies on this finding some marginal benefit. You can get this on [employer] in pill form or you can try Activia yogurt for probiotics.  Take care and seek immediate care sooner if you develop any concerns.   Dr. [facility]

## 2018-01-01 NOTE — Assessment & Plan Note (Addendum)
Wet prep from self swab consistent with recurrent BV. Will prescribe clindamycin with refills. Encouraged continued supportive care measures as discussed above. Also discussed possible benefit of probiotics based on previous studies. Return precautions discussed.

## 2018-01-03 ENCOUNTER — Encounter

## 2018-01-06 ENCOUNTER — Ambulatory Visit: Payer: Medicaid Other

## 2018-01-06 DIAGNOSIS — M546 Pain in thoracic spine: Secondary | ICD-10-CM

## 2018-01-06 DIAGNOSIS — M25511 Pain in right shoulder: Secondary | ICD-10-CM

## 2018-01-06 DIAGNOSIS — M25512 Pain in left shoulder: Secondary | ICD-10-CM | POA: Diagnosis not present

## 2018-01-06 DIAGNOSIS — M542 Cervicalgia: Secondary | ICD-10-CM | POA: Diagnosis not present

## 2018-01-06 DIAGNOSIS — R252 Cramp and spasm: Secondary | ICD-10-CM

## 2018-01-06 NOTE — Patient Instructions (Signed)
Access Code: [email or web address]  URL: [email or web address]  Date: 01/06/2018  Prepared by: Lorrene Reid   Exercises   Shoulder External Rotation and Scapular Retraction with Resistance - 10 reps - 3 sets - 2x daily - 7x weekly  Scapular Retraction with Resistance - 10 reps - 3 sets - 2x daily - 7x weekly  Scapular Retraction with Resistance Advanced - 10 reps - 3 sets - 2x daily - 7x weekly  Supine Shoulder Horizontal Abduction with Resistance - 10 reps - 2 sets - 2x daily - 7x weekly  Supine Bilateral Shoulder External Rotation with Resistance - 10 reps - 2 sets - 2x daily - 7x weekly  Supine PNF D2 Flexion with Resistance - 10 reps - 2 sets - 2x daily - 7x weekly

## 2018-01-06 NOTE — Therapy (Signed)
[facility] [address], [address] [place], [place], [address] Phone: [phone]   Fax:  [phone]  Physical Therapy Treatment  Patient Details  Name: Suzanne Ellis MRN: [id number] Date of Birth: 1985/08/30 Referring Provider (PT): Terisa Starr, MD   Encounter Date: 01/06/2018  PT End of Session - 01/06/18 0916    Visit Number  6    Date for PT Re-Evaluation  01/06/18    Authorization Type  Medicaid: 12 visits 9/16-10/27    Authorization - Visit Number  4    Authorization - Number of Visits  12    PT Start Time  0848    PT Stop Time  0915    PT Time Calculation (min)  27 min    Activity Tolerance  Patient tolerated treatment well    Behavior During Therapy  [facility] for tasks assessed/performed       Past Medical History:  Diagnosis Date  . 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 CHLAMYDTRACHOMATIS INFECTION LOWER GU SITES 07/28/2008   Qualifier: Diagnosis of  By: Jake Shark RN, Amy    . No pertinent past medical history     Past Surgical History:  Procedure Laterality Date  . ARM WOUND REPAIR / CLOSURE    . DILATION AND CURETTAGE OF UTERUS    . termination    . WISDOM TOOTH EXTRACTION      There were no vitals filed for this visit.  Subjective Assessment - 01/06/18 0854    Subjective  I am having terrible tooth pain so I don't notice my neck and shoulder pain.  I was having Rt shoulder pain and couldn't lift my arm the other day.  Now it is OK.      Patient Stated Goals  reduce neck and shoulder pain, reduce headaches    Currently in Pain?  Yes    Pain Orientation  Right;Left    Pain Descriptors / Indicators  Aching    Pain Onset  More than a month ago    Pain Frequency  Intermittent    Aggravating Factors   morning hours, lying down raising arm    Pain Relieving Factors  change of position, pain medication                       [facility] Adult PT Treatment/Exercise - 01/06/18 0001       Exercises   Exercises  Shoulder;Neck;Knee/Hip      Neck Exercises: Machines for Strengthening   Nustep  Level 2x 6 minutes       Shoulder Exercises: Supine   Horizontal ABduction  Strengthening;Both;20 reps;Theraband    Theraband Level (Shoulder Horizontal ABduction)  Level 1 (Yellow)    External Rotation  Strengthening;Both;20 reps    Theraband Level (Shoulder External Rotation)  Level 1 (Yellow)    Diagonals  Strengthening;Both;20 reps;Theraband    Theraband Level (Shoulder Diagonals)  Level 1 (Yellow)      Shoulder Exercises: Standing   Extension  Strengthening;Both;20 reps;Theraband    Theraband Level (Shoulder Extension)  Level 1 (Yellow)    Row  Strengthening;Both;20 reps;Theraband    Theraband Level (Shoulder Row)  Level 1 (Yellow)      Shoulder Exercises: Pulleys   Flexion  3 minutes      Moist Heat Therapy   Number Minutes Moist Heat  --    Moist Heat Location  --      Programme researcher, broadcasting/film/video Location  --  Electrical Stimulation Action  --    Statistician Parameters  --    Statistician Goals  --             PT Education - 01/06/18 0910    Education Details   Access Code: [email or web address]     Person(s) Educated  Patient    Methods  Explanation;Handout;Demonstration    Comprehension  Verbalized understanding;Returned demonstration       PT Short Term Goals - 01/06/18 0859      PT SHORT TERM GOAL #1   Title  be independent in initial HEP    Status  Achieved      PT SHORT TERM GOAL #4   Title  demonstrate > or = to 140 degrees of bilateral shoulder flexion to improve reaching    Status  Achieved        PT Long Term Goals - 11/27/17 1610      PT LONG TERM GOAL #1   Title  be independent in advanced HEP    Time  8    Period  Weeks    Status  On-going      PT LONG TERM GOAL #2   Title  report 75% reduction in frequency and intensity of headaches    Baseline  frequency reduced by 10%    Time  8     Period  Weeks    Status  On-going      PT LONG TERM GOAL #3   Title  demonstrate > or = to 160 degrees bil shoulder flexion 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    Baseline  Rt 140 degrees, Lt 140 degrees    Time  8    Period  Weeks    Status  On-going      PT LONG TERM GOAL #4   Title  sit at work for > or = to 1 hour with < or = to 2/10 neck and shoulder pain    Baseline  5/10    Time  8    Period  Weeks    Status  On-going      PT LONG TERM GOAL #5   Title  demonstrate > or = to 85 degrees of Rt cervical A/ROM rotation to improve safety with driving    Baseline  70 degrees Rt, 75 degrees Lt    Time  8    Period  Weeks    Status  On-going            Plan - 01/06/18 0916    Clinical Impression Statement  Pt has significant tooth pain today and is on medication now.  Pt reports that her tooth pain is overpowering her neck and shoulder pain.  Pt reports that she feels better overall but is not able to determine how much because of the mouth pain.  Pt requested to end session early due to pain.  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  Pt will continue to benefit from skilled PT for strength, flexibility, manual and modalities.      Rehab Potential  Excellent    PT Frequency  2x / week    PT Duration  8 weeks    PT Treatment/Interventions  ADLs/Self Care Home Management;Cryotherapy;Electrical Stimulation;Ultrasound;Traction;Moist Heat;Gait training;Functional mobility training;Therapeutic activities;Therapeutic exercise;Patient/family education;Neuromuscular re-education;Manual techniques;Passive range of motion;Taping;Dry needling    PT Next Visit Plan  thoracic mobs, modalities, postural strength.  Dry needling if this was helpful.  ERO for Medicaid and cert to MD.    PT Home Exercise Plan  Access Code: [email or web address]    Consulted and Agree with Plan of Care  Patient       Patient will benefit from skilled therapeutic intervention in  order to improve the following deficits and impairments:  Improper body mechanics, Pain, Postural dysfunction, Increased muscle spasms, Decreased activity tolerance, Hypomobility, Impaired flexibility, Impaired UE functional use, Decreased mobility  Visit Diagnosis: Acute pain of left shoulder  Acute pain of right shoulder  Cramp and spasm  Cervicalgia  Pain in thoracic spine     Problem List Patient Active Problem List   Diagnosis Date Noted  . Pain due to dental caries 11/19/2017  . Concussion without loss of consciousness 11/03/2017  . Whiplash injury syndrome, subsequent encounter 11/03/2017  . Acute pain of both shoulders 10/28/2017  . Abnormal Pap smear of cervix 08/23/2017  . Abdominal pain, chronic, right lower quadrant 08/23/2017  . 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 Migraine with aura 12/07/2015  . Unspecified episodic mood disorder 10/28/2013  . Recurrent bacterial vaginosis 05/22/2013  . Bipolar disorder (HCC) 01/19/2013  . Vaginal discharge 09/19/2011  . MIGRAINE WITHOUT AURA 09/14/2009  . PTSD 08/18/2009  . History of chronic PID 01/06/2009  . TOBACCO DEPENDENCE 05/16/2006  . DEPRESSIVE DISORDER, NOS 05/16/2006    Lorrene Reid, PT 01/06/18 9:20 AM  [facility] [address] [place], [place], [address] Phone: [phone]   Fax:  [phone]  Name: Suzanne Ellis MRN: [id number] Date of Birth: June 07, 1985

## 2018-01-08 ENCOUNTER — Ambulatory Visit: Payer: Medicaid Other

## 2018-01-09 ENCOUNTER — Other Ambulatory Visit: Payer: Self-pay | Admitting: Family Medicine

## 2018-01-09 ENCOUNTER — Telehealth: Payer: Self-pay | Admitting: Family Medicine

## 2018-01-09 MED ORDER — METRONIDAZOLE 500 MG PO TABS: 500.0000 mg | ORAL_TABLET | Freq: Two times a day (BID) | ORAL | 0 refills | Status: AC

## 2018-01-09 NOTE — Telephone Encounter (Signed)
Pt informed of below. Zimmerman Rumple, April D, CMA  

## 2018-01-09 NOTE — Telephone Encounter (Signed)
I am sending this to Dr. Linwood Dibbles since she saw the patient on this date.

## 2018-01-09 NOTE — Telephone Encounter (Signed)
Pt was seen by Dr. Frances Furbish on 01/01/18 and given clindamycin for her bacterial infection. Pt states she has had no improvement and that the medication is not working at all. Pt would like for Dr. Frances Furbish to send a prescription for Metronidazole to her pharmacy. This is what she has always used in the past. Pt would like for someone to call her at [phone].

## 2018-01-09 NOTE — Telephone Encounter (Signed)
Rx sent to pharmacy   

## 2018-01-27 ENCOUNTER — Other Ambulatory Visit: Payer: Self-pay

## 2018-01-27 ENCOUNTER — Ambulatory Visit: Payer: Medicaid Other | Admitting: Family Medicine

## 2018-01-27 ENCOUNTER — Encounter: Payer: Self-pay | Admitting: Family Medicine

## 2018-01-27 ENCOUNTER — Ambulatory Visit: Payer: Medicaid Other | Admitting: Psychology

## 2018-01-27 VITALS — BP 110/70 | HR 94 | Temp 98.8°F | Ht 65.0 in | Wt 164.0 lb

## 2018-01-27 DIAGNOSIS — G43109 Migraine with aura, not intractable, without status migrainosus: Secondary | ICD-10-CM

## 2018-01-27 DIAGNOSIS — F418 Other specified anxiety disorders: Secondary | ICD-10-CM | POA: Insufficient documentation

## 2018-01-27 DIAGNOSIS — F411 Generalized anxiety disorder: Secondary | ICD-10-CM

## 2018-01-27 MED ORDER — SUMATRIPTAN SUCCINATE 100 MG PO TABS: 100.0000 mg | ORAL_TABLET | ORAL | 0 refills | Status: DC | PRN

## 2018-01-27 MED ORDER — KETOROLAC TROMETHAMINE 30 MG/ML IJ SOLN
30.0000 mg | Freq: Once | INTRAMUSCULAR | Status: AC
  Administered 2018-01-27: 30 mg via INTRAMUSCULAR

## 2018-01-27 MED ORDER — NAPROXEN 500 MG PO TABS: 500.0000 mg | ORAL_TABLET | Freq: Two times a day (BID) | ORAL | 0 refills | Status: DC
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
## 2018-01-27 NOTE — Patient Instructions (Signed)
It was great seeing you today! We have addressed the following issues today  1. I am starting you on a a combination of sumatriptan and naproxen to help control your headaches you can take both pill together if headaches does not improve after two hours you can repeat the treatment.  2. I am referring to neurology to further discuss other possible contributing factor given your history of chiari malformation 3. Make sure you follow up with behavioral health to discuss anxiety/ depression which could also be contributing.  If we did any lab work today, and the results require attention, either me or my nurse will get in touch with you. If everything is normal, you will get a letter in mail and a message via . If you don't hear from [place] in two weeks, please give [place] a call. Otherwise, we look forward to seeing you again at your next visit. If you have any questions or concerns before then, please call the clinic at [phone].  Please bring all your medications to every doctors visit  Sign up for My Chart to have easy access to your labs results, and communication with your Primary care physician. Please ask Front Desk for some assistance.   Please check-out at the front desk before leaving the clinic.    Take Care,   Dr. Sydnee Cabal
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
## 2018-01-28 NOTE — Addendum Note (Signed)
Addended by: Spero GeraldsKANE, MICHELLE E on: 01/28/2018 02:14 PM   Modules accepted: Level of Service

## 2018-02-04 ENCOUNTER — Encounter: Payer: Self-pay | Admitting: Neurology

## 2018-02-05 ENCOUNTER — Ambulatory Visit: Payer: Medicaid Other | Attending: Family Medicine

## 2018-02-05 DIAGNOSIS — M546 Pain in thoracic spine: Secondary | ICD-10-CM | POA: Diagnosis not present

## 2018-02-05 DIAGNOSIS — R252 Cramp and spasm: Secondary | ICD-10-CM | POA: Insufficient documentation

## 2018-02-05 DIAGNOSIS — M542 Cervicalgia: Secondary | ICD-10-CM | POA: Insufficient documentation

## 2018-02-05 DIAGNOSIS — M25512 Pain in left shoulder: Secondary | ICD-10-CM | POA: Diagnosis not present

## 2018-02-05 DIAGNOSIS — M25511 Pain in right shoulder: Secondary | ICD-10-CM | POA: Diagnosis not present
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
## 2018-02-06 ENCOUNTER — Encounter: Payer: Self-pay | Admitting: Neurology

## 2018-02-06 ENCOUNTER — Ambulatory Visit: Payer: Medicaid Other | Admitting: Neurology

## 2018-02-06 VITALS — BP 108/58 | HR 92 | Ht 65.0 in | Wt 166.0 lb

## 2018-02-06 DIAGNOSIS — M5416 Radiculopathy, lumbar region: Secondary | ICD-10-CM

## 2018-02-06 DIAGNOSIS — M5412 Radiculopathy, cervical region: Secondary | ICD-10-CM | POA: Diagnosis not present

## 2018-02-06 DIAGNOSIS — G43709 Chronic migraine without aura, not intractable, without status migrainosus: Secondary | ICD-10-CM | POA: Diagnosis not present

## 2018-02-06 MED ORDER — TOPIRAMATE 50 MG PO TABS: 50.0000 mg | ORAL_TABLET | Freq: Every day | ORAL | 3 refills | Status: DC

## 2018-02-06 MED ORDER — ONDANSETRON HCL 4 MG PO TABS: 4.0000 mg | ORAL_TABLET | Freq: Three times a day (TID) | ORAL | 3 refills | Status: DC | PRN

## 2018-02-06 NOTE — Patient Instructions (Signed)
Migraine Recommendations: 1.  Start topiramate 50mg  at bedtime.  Contact in 4 weeks with update and we can adjust dose if needed. 2.  STOP IBUPROFEN.  Take sumatriptan (with or without naproxen 500) at earliest onset of headache.  May repeat dose once in 2 hours if needed.  Do not exceed two tablets in 24 hours.  For nausea, you may take ondansetron as directed. 3.  Limit use of pain relievers to no more than 2 days out of the week.  These medications include acetaminophen, ibuprofen, triptans and narcotics.  This will help reduce risk of rebound headaches. 4.  Be aware of common food triggers such as processed sweets, processed foods with nitrites (such as deli meat, hot dogs, sausages), foods with MSG, alcohol (such as wine), chocolate, certain cheeses, certain fruits (dried fruits, bananas, pineapple), vinegar, diet soda. 4.  Avoid caffeine 5.  Routine exercise 6.  Proper sleep hygiene 7.  Stay adequately hydrated with water 8.  Keep a headache diary. 9.  Maintain proper stress management. 10.  Do not skip meals. 11.  Consider supplements:  Magnesium citrate 400mg  to 600mg  daily, riboflavin 400mg , Coenzyme Q 10 100mg  three times daily 12.  Follow up in 3 to 4 months.

## 2018-03-14 ENCOUNTER — Ambulatory Visit: Payer: Medicaid Other

## 2018-03-17 ENCOUNTER — Telehealth: Payer: Self-pay

## 2018-03-17 ENCOUNTER — Other Ambulatory Visit: Payer: Self-pay | Admitting: Family Medicine

## 2018-03-17 ENCOUNTER — Ambulatory Visit (INDEPENDENT_AMBULATORY_CARE_PROVIDER_SITE_OTHER): Payer: Medicaid Other

## 2018-03-17 DIAGNOSIS — Z3042 Encounter for surveillance of injectable contraceptive: Secondary | ICD-10-CM

## 2018-03-17 DIAGNOSIS — Z3202 Encounter for pregnancy test, result negative: Secondary | ICD-10-CM | POA: Diagnosis not present

## 2018-03-17 LAB — POCT URINE PREGNANCY: Preg Test, Ur: NEGATIVE

## 2018-03-17 MED ORDER — MEDROXYPROGESTERONE ACETATE 150 MG/ML IM SUSY
150.0000 mg | PREFILLED_SYRINGE | Freq: Once | INTRAMUSCULAR | Status: AC
  Administered 2018-03-17: 150 mg via INTRAMUSCULAR

## 2018-03-17 MED ORDER — METRONIDAZOLE 500 MG PO TABS: 500.0000 mg | ORAL_TABLET | Freq: Two times a day (BID) | ORAL | 0 refills | Status: DC

## 2018-03-17 NOTE — Telephone Encounter (Signed)
Patient seen by Dr Linwood Dibblesumball in 12/2017. Given Rx for Clindamycin with refills for recurrent BV and Diflucan with refills.   She called back a few days later and reported that Clindamycin not working and asking for Rx for Metronidazole, which has always worked in the past. This was changed but no refills were put on the Metronidazole. She is asking for two refills on the Metronidazole as was put on the Clindamycin which did not work. She needs to get this filled for BV.  [name], RN [facility] RN)

## 2018-03-17 NOTE — Progress Notes (Signed)
   Patient presents not within dates for Depo-Provera injection. Urine pregnancy test negative. Denies unprotected intercourse. Injection given RUOQ. Tolerated well. Next injection due 06/03/18-06/17/18. Reminder card given. Ples SpecterAlisa Demontay Grantham, RN [facility] RN)

## 2018-03-17 NOTE — Telephone Encounter (Signed)
Wet prep not fully convincing for BV but since her symptoms are similar, will prescribe 7 day course of metronidazole, Rx sent to pharmacy. She can also use vaginal boric acid suppositories daily at bedtime for 30 days along with this to prevent recurrence. We can use metro gel after completing metronidazole and boric acid course, but that tends to be pretty expensive, if she wants to do this, I can send it in.

## 2018-03-20 ENCOUNTER — Other Ambulatory Visit: Payer: Self-pay | Admitting: Family Medicine

## 2018-03-20 MED ORDER — BORIC ACID CRYS: 600.0000 mg | CRYSTALS | Freq: Every day | 0 refills | Status: AC

## 2018-03-20 NOTE — Telephone Encounter (Signed)
Patient informed of medication being sent to the pharmacy.  She would like a script for the boric acid suppositories sent to her pharmacy.  She was prescribed this in the past and it was about $150 so she never picked up the script at that time.  Patient would like to try this again and see if with her new insurance it may be cheaper or she can find a coupon for it online.  Will forward to MD to send in this script.  Suzanne Ellis,CMA

## 2018-03-20 NOTE — Telephone Encounter (Signed)
Rx sent 

## 2018-03-21 ENCOUNTER — Telehealth: Payer: Self-pay | Admitting: *Deleted

## 2018-03-21 DIAGNOSIS — B9689 Other specified bacterial agents as the cause of diseases classified elsewhere: Secondary | ICD-10-CM

## 2018-03-21 DIAGNOSIS — N760 Acute vaginitis: Principal | ICD-10-CM

## 2018-03-21 MED ORDER — METRONIDAZOLE 0.75 % VA GEL: 1.0000 | Freq: Two times a day (BID) | VAGINAL | 0 refills | Status: DC

## 2018-03-21 NOTE — Telephone Encounter (Signed)
Pt informed of below. Zimmerman Rumple, April D, CMA  

## 2018-03-21 NOTE — Addendum Note (Signed)
Addended by: Telford Nab on: 03/21/2018 04:21 PM   Modules accepted: Orders

## 2018-03-21 NOTE — Telephone Encounter (Signed)
Received fax that Boric Acid vaginal suppositories would require a PA.  This medication is not on the formulary.  Contacted pharmacy and since it is OTC will likely not be covered by medicaid at all and would cost patient $800.  Will forward to MD for next step. Suzan Manon, Maryjo Rochester, CMA

## 2018-03-21 NOTE — Telephone Encounter (Signed)
Patient with recurrent BV who had been prescribed a course of boric acid vaginal suppositories after her most recent episode of BV and oral Metro to treat in an effort to reduce recurrence.  There is a coverage problem with this and it will not be covered by insurance, when we tried to work out a prior authorization the pharmacy told us it would not be covered and would cost $800.  Metronidazole gel prescribed instead, CMA will contact patient.  Dr. Parke SimmersBland
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
## 2018-04-21 ENCOUNTER — Ambulatory Visit: Payer: Medicaid Other | Admitting: Neurology

## 2018-04-23 ENCOUNTER — Encounter: Payer: Self-pay | Admitting: Family Medicine

## 2018-04-23 ENCOUNTER — Ambulatory Visit (INDEPENDENT_AMBULATORY_CARE_PROVIDER_SITE_OTHER): Payer: Medicaid Other | Admitting: Family Medicine

## 2018-04-23 ENCOUNTER — Other Ambulatory Visit: Payer: Self-pay

## 2018-04-23 ENCOUNTER — Other Ambulatory Visit (HOSPITAL_COMMUNITY)
Admission: RE | Admit: 2018-04-23 | Discharge: 2018-04-23 | Disposition: A | Discharge status: Home / Self Care | Payer: Medicaid Other | Source: Ambulatory Visit | Attending: Family Medicine | Admitting: Family Medicine

## 2018-04-23 VITALS — BP 105/60 | HR 83 | Temp 99.2°F | Wt 168.0 lb

## 2018-04-23 DIAGNOSIS — F17200 Nicotine dependence, unspecified, uncomplicated: Secondary | ICD-10-CM

## 2018-04-23 DIAGNOSIS — N898 Other specified noninflammatory disorders of vagina: Secondary | ICD-10-CM

## 2018-04-23 LAB — POCT WET PREP (WET MOUNT)
CLUE CELLS WET PREP WHIFF POC: NEGATIVE
TRICHOMONAS WET PREP HPF POC: ABSENT

## 2018-04-23 MED ORDER — VARENICLINE TARTRATE 0.5 MG X 11 & 1 MG X 42 PO MISC: ORAL | 0 refills | Status: DC

## 2018-04-23 NOTE — Progress Notes (Signed)
Subjective: Chief Complaint  Patient presents with  . Nicotine Dependence    HPI: Suzanne Ellis is a 33 y.o. presenting to clinic today to discuss the following:  STD Check Patient presents today because over the past few days she has had some mild abdominal pain. She states she has a history of PID and multiple episodes of BV. She is worried she may have one and wants to get checked. She has not been sexually active in over a year. She has no malodor, vaginal discharge, vaginal pain, itching, rash, or urinary burning, urgency, or pain. Her abdominal pain is responding to Tylenol and Motrin. She is currently having her period and thinks the pain could be due to that.  She denies fever, chills, SOB, cough, congestion, rhinorrhea, nausea, vomiting, or constipation.  Tobacco Abuse Disorder  Wants to quit and wants to start Chantix.  ROS noted in HPI.   Past Medical, Surgical, Social, and Family History Reviewed & Updated per EMR.   Pertinent Historical Findings include:   Social History   Tobacco Use  Smoking Status Former Smoker  . Packs/day: 0.50  . Years: 10.00  . Pack years: 5.00  . Types: Cigarettes  Smokeless Tobacco Never Used   Objective: BP 105/60   Pulse 83   Temp 99.2 F (37.3 C) (Oral)   Wt 168 lb (76.2 kg)   SpO2 98%   BMI 27.96 kg/m  Vitals and nursing notes reviewed  Physical Exam Constitutional:      Appearance: Normal appearance.  Cardiovascular:     Rate and Rhythm: Normal rate and regular rhythm.     Pulses: Normal pulses.     Heart sounds: No murmur.  Pulmonary:     Effort: Pulmonary effort is normal.     Breath sounds: Normal breath sounds.  Abdominal:     General: Bowel sounds are normal. There is no distension.     Palpations: Abdomen is soft. There is no mass.     Tenderness: There is abdominal tenderness. There is no guarding or rebound.  Genitourinary:    General: Normal vulva.     Exam position: Supine.     Labia:      Right: No rash, tenderness or lesion.        Left: No rash, tenderness or lesion.      Vagina: No vaginal discharge.    Microscopic wet-mount exam shows negative for pathogens, normal epithelial cells. GC/CC: negative  Assessment/Plan:  TOBACCO DEPENDENCE Chantix starter pack given. Patient also given information about [place] Quit line. Encouraged patient that quitting smoking is the best change she can make for her health. - If patient is still continuing to struggle with quitting smoking, refer to Dr. Raymondo Band.  STD Check Wet prep and GC/CC all negative. Return as needed.  PATIENT EDUCATION PROVIDED: See AVS    Diagnosis and plan along with any newly prescribed medication(s) were discussed in detail with this patient today. The patient verbalized understanding and agreed with the plan. Patient advised if symptoms worsen return to clinic or ER.    Orders Placed This Encounter  Procedures  . POCT Wet Prep [facility])    Meds ordered this encounter  Medications  . varenicline (CHANTIX STARTING MONTH PAK) 0.5 MG X 11 & 1 MG X 42 tablet    Sig: Take one 0.5 mg tablet by mouth once daily for 3 days, then increase to one 0.5 mg tablet twice daily for 4 days, then increase to  one 1 mg tablet twice daily.    Dispense:  53 tablet    Refill:  0     Jules Schickim Treg Diemer, DO 04/23/2018, 4:09 PM PGY-2 [facility]

## 2018-04-23 NOTE — Patient Instructions (Signed)
It was great to meet you today! Thank you for letting me participate in your care!  Today, we discussed your recent symptoms and so far your tests were all normal  I have sent the prescription for Chantix to your pharmacy. Please use it as instructed. Please call us if you have any questions or concerns. You CAN do it!!!  Be well, Jules Schick, DO PGY-2, [facility]

## 2018-04-24 ENCOUNTER — Ambulatory Visit: Payer: Medicaid Other | Admitting: Family Medicine

## 2018-04-26 LAB — CERVICOVAGINAL ANCILLARY ONLY
Chlamydia: NEGATIVE
Neisseria Gonorrhea: NEGATIVE

## 2018-04-26 NOTE — Assessment & Plan Note (Signed)
Chantix starter pack given. Patient also given information about [place] Quit line. Encouraged patient that quitting smoking is the best change she can make for her health. - If patient is still continuing to struggle with quitting smoking, refer to Dr. Raymondo BandKoval.

## 2018-06-25 ENCOUNTER — Other Ambulatory Visit: Payer: Self-pay | Admitting: *Deleted

## 2018-06-25 MED ORDER — VARENICLINE TARTRATE 0.5 MG X 11 & 1 MG X 42 PO MISC: ORAL | 0 refills | Status: DC

## 2018-06-25 MED ORDER — FLUCONAZOLE 150 MG PO TABS: 150.0000 mg | ORAL_TABLET | Freq: Once | ORAL | 0 refills | Status: AC

## 2018-06-25 MED ORDER — METRONIDAZOLE 500 MG PO TABS: 500.0000 mg | ORAL_TABLET | Freq: Two times a day (BID) | ORAL | 0 refills | Status: DC

## 2018-06-25 NOTE — Telephone Encounter (Signed)
Pt calls to request refill on metronidazole tablet, diflucan and chantix.  She would prefer not to have an appt.  1. Metronidazole / diflucan because she has a discharge, fishy odor.  No itching.  Has long history of BV  2.  She recently moved and lost box of chantix, so needs to start over. Jone Baseman, CMA

## 2018-06-25 NOTE — Telephone Encounter (Signed)
Patient called in with symptoms she thinks are consistent with past BV/yeast infection.  Due to covid19 outbreak,. Will treat based off phone symptoms.   Will also refill chantix as requested.  Patient will be told by CMA that she should avoid alcohol and come in for G/C testing if she has  Concerns of STI exposure  -Dr. Parke Simmers

## 2018-06-26 NOTE — Telephone Encounter (Signed)
Pt informed. Deseree Blount, CMA  

## 2018-07-01 ENCOUNTER — Ambulatory Visit: Payer: Medicaid Other

## 2018-07-02 ENCOUNTER — Ambulatory Visit (INDEPENDENT_AMBULATORY_CARE_PROVIDER_SITE_OTHER): Payer: Medicaid Other

## 2018-07-02 ENCOUNTER — Other Ambulatory Visit: Payer: Self-pay

## 2018-07-02 DIAGNOSIS — Z3042 Encounter for surveillance of injectable contraceptive: Secondary | ICD-10-CM | POA: Diagnosis not present

## 2018-07-02 LAB — POCT URINE PREGNANCY: Preg Test, Ur: NEGATIVE

## 2018-07-02 MED ORDER — MEDROXYPROGESTERONE ACETATE 150 MG/ML IM SUSP
150.0000 mg | Freq: Once | INTRAMUSCULAR | Status: AC
  Administered 2018-07-02: 150 mg via INTRAMUSCULAR

## 2018-07-02 NOTE — Progress Notes (Signed)
Pt presents in nurse clinic for depo provera injection. Pt is not within dates, urine pregnancy obtained, negative. Injection given LUOQ, site unremarkable. Pt instructed to use back up method for 1 week. Pt to return for next injection, 7/1-7/15, reminder card given.

## 2018-08-05 ENCOUNTER — Other Ambulatory Visit: Payer: Self-pay | Admitting: *Deleted

## 2018-08-05 DIAGNOSIS — B9689 Other specified bacterial agents as the cause of diseases classified elsewhere: Secondary | ICD-10-CM

## 2018-08-05 DIAGNOSIS — N760 Acute vaginitis: Secondary | ICD-10-CM

## 2018-08-05 MED ORDER — FLUCONAZOLE 150 MG PO TABS: 150.0000 mg | ORAL_TABLET | Freq: Once | ORAL | 0 refills | Status: AC

## 2018-08-05 MED ORDER — METRONIDAZOLE 0.75 % VA GEL: 1.0000 | Freq: Two times a day (BID) | VAGINAL | 0 refills | Status: DC

## 2018-08-05 NOTE — Addendum Note (Signed)
Addended by: Jone Baseman D on: 08/05/2018 03:03 PM   Modules accepted: Orders

## 2018-08-05 NOTE — Telephone Encounter (Signed)
Patient requesting metronidazole and Diflucan for repeat of vaginal discharge symptoms.  Is concerned about coming to the office out of fear of coronavirus.  Given the situation we can refill this low risk medication over the phone.  Patient is advised to not drink alcohol while using metronidazole  -Dr. Parke Simmers

## 2018-08-05 NOTE — Telephone Encounter (Signed)
Pt states that she needs metronidazole pills and the diflucan for afterwards.    States that she is having her "normal BV discharge" and is very resistant to making an in office appt at this time.    Advised I would send a message to MD. Jone Baseman, CMA

## 2018-08-05 NOTE — Telephone Encounter (Signed)
Pt request the pills and not the gel as it is very expensive.  Back to PCP. Jone Baseman, CMA

## 2018-08-06 NOTE — Telephone Encounter (Signed)
Telemedicine appt made with Dr. Gwendolyn Grant for tomorrow @ 8:30am. Jone Baseman, CMA

## 2018-08-06 NOTE — Telephone Encounter (Signed)
Pt calls to check status. Advised that MD refused the pills due to needing to discuss risk.    Pt states that she has not been sexually active in "years" and is on depo.  She declines tele visit until she hears back from MD. Jone Baseman, CMA

## 2018-08-07 ENCOUNTER — Telehealth: Payer: Medicaid Other | Admitting: Family Medicine

## 2018-08-07 ENCOUNTER — Other Ambulatory Visit: Payer: Self-pay

## 2018-08-07 NOTE — Telephone Encounter (Signed)
Attempted to call patient several times but no answer for telemedicine appt

## 2018-08-12 ENCOUNTER — Telehealth: Payer: Self-pay | Admitting: *Deleted

## 2018-08-12 NOTE — Telephone Encounter (Signed)
Pt was recently around someone who tested positive for covid-19.  She is asymptomatic now.  Advised to self quarantine and discussed symptoms and need to call us if she develops them.  Also discussed hand hygiene. Will forward to MD for FYI. Jone Baseman, CMA

## 2018-08-27 ENCOUNTER — Ambulatory Visit: Payer: Medicaid Other | Admitting: Student in an Organized Health Care Education/Training Program

## 2018-08-27 ENCOUNTER — Telehealth (INDEPENDENT_AMBULATORY_CARE_PROVIDER_SITE_OTHER): Payer: Medicaid Other | Admitting: Student in an Organized Health Care Education/Training Program

## 2018-08-27 ENCOUNTER — Other Ambulatory Visit: Payer: Self-pay

## 2018-08-27 ENCOUNTER — Encounter: Payer: Self-pay | Admitting: Student in an Organized Health Care Education/Training Program

## 2018-08-27 DIAGNOSIS — N939 Abnormal uterine and vaginal bleeding, unspecified: Secondary | ICD-10-CM | POA: Insufficient documentation

## 2018-08-27 NOTE — Assessment & Plan Note (Signed)
Breakthrough bleeding on depo provera which is not normal for patient- plus has vaginal discharge and odor.  Made appointment for patient to come in for exam this afternoon.

## 2018-08-27 NOTE — Progress Notes (Signed)
[place] Telemedicine Visit  Patient consented to have virtual visit. Method of visit: Telephone  Encounter participants: Patient: Suzanne Ellis - located at home Provider: Richarda Osmond - located at [facility]  Chief Complaint: bleeding on depo provera  HPI:  Patient long-term depo provera use and does not have regular periods. She has break through bleeding in the past when she is overdue for her shots but not while she is current for several years. Due Jul1 for next shot. 3 days ago started vaginal bleeding which is described as light spotting. Hasn't been sexually active for "long time". No changes in soaps. Positive cramping. Positive discharge- frothy thin, and foul odor. Denies lightheadedness.  Patient is willing to come in for an exam today.  ROS: per HPI  Pertinent PMHx: frequent BV infections  Exam:  Respiratory: not short of breath- speaking in full sentences.  Assessment/Plan:  Vaginal bleeding Breakthrough bleeding on depo provera which is not normal for patient- plus has vaginal discharge and odor.  Made appointment for patient to come in for exam this afternoon.    Time spent during visit with patient: 6 minutes

## 2018-09-02 ENCOUNTER — Telehealth (INDEPENDENT_AMBULATORY_CARE_PROVIDER_SITE_OTHER): Payer: Medicaid Other | Admitting: Family Medicine

## 2018-09-02 ENCOUNTER — Telehealth: Payer: Self-pay | Admitting: *Deleted

## 2018-09-02 ENCOUNTER — Other Ambulatory Visit: Payer: Self-pay

## 2018-09-02 DIAGNOSIS — Z20822 Contact with and (suspected) exposure to covid-19: Secondary | ICD-10-CM

## 2018-09-02 DIAGNOSIS — Z20828 Contact with and (suspected) exposure to other viral communicable diseases: Secondary | ICD-10-CM | POA: Diagnosis not present

## 2018-09-02 NOTE — Telephone Encounter (Signed)
-----   Message from Sela Hilding, MD sent at 09/02/2018  3:39 PM EDT ----- Also need testing for this patient please, she has been around a family member who has tested positive this week. She has no sxs right now. Thanks, dr. Lindell Noe

## 2018-09-02 NOTE — Telephone Encounter (Signed)
Patient Scheduled for Covid testing at the [facility] campus 09/03/2018. Instructions given and order placed.
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
## 2018-09-03 ENCOUNTER — Other Ambulatory Visit: Payer: Self-pay

## 2018-09-03 DIAGNOSIS — R6889 Other general symptoms and signs: Secondary | ICD-10-CM | POA: Diagnosis not present

## 2018-09-03 DIAGNOSIS — Z20822 Contact with and (suspected) exposure to covid-19: Secondary | ICD-10-CM

## 2018-09-04 ENCOUNTER — Ambulatory Visit: Payer: Medicaid Other | Admitting: Student in an Organized Health Care Education/Training Program

## 2018-09-06 LAB — NOVEL CORONAVIRUS, NAA: SARS-CoV-2, NAA: NOT DETECTED

## 2018-09-12 ENCOUNTER — Other Ambulatory Visit: Payer: Self-pay

## 2018-09-12 ENCOUNTER — Ambulatory Visit (INDEPENDENT_AMBULATORY_CARE_PROVIDER_SITE_OTHER): Payer: Medicaid Other | Admitting: Family Medicine

## 2018-09-12 ENCOUNTER — Other Ambulatory Visit (HOSPITAL_COMMUNITY)
Admission: RE | Admit: 2018-09-12 | Discharge: 2018-09-12 | Disposition: A | Discharge status: Home / Self Care | Payer: Medicaid Other | Source: Ambulatory Visit | Attending: Family Medicine | Admitting: Family Medicine

## 2018-09-12 ENCOUNTER — Ambulatory Visit: Payer: Medicaid Other | Admitting: Student in an Organized Health Care Education/Training Program

## 2018-09-12 VITALS — BP 140/75 | HR 110 | Wt 178.0 lb

## 2018-09-12 DIAGNOSIS — N920 Excessive and frequent menstruation with regular cycle: Secondary | ICD-10-CM

## 2018-09-12 DIAGNOSIS — N898 Other specified noninflammatory disorders of vagina: Secondary | ICD-10-CM | POA: Diagnosis not present

## 2018-09-12 LAB — POCT WET PREP (WET MOUNT)
Clue Cells Wet Prep Whiff POC: NEGATIVE
Trichomonas Wet Prep HPF POC: ABSENT

## 2018-09-12 LAB — POCT URINE PREGNANCY: Preg Test, Ur: NEGATIVE

## 2018-09-12 MED ORDER — FLUCONAZOLE 150 MG PO TABS: 150.0000 mg | ORAL_TABLET | Freq: Once | ORAL | 0 refills | Status: AC

## 2018-09-12 NOTE — Patient Instructions (Signed)
  Please take diflucan 150 mg one time to clear yeast infection.  For recurrent BV you might want to try boric acid suppositories once a week at night to maintain acidic vaginal pH. You can buy these on the internet or at [place]  If you have questions or concerns please do not hesitate to call at [phone].  Lucila Maine, DO PGY-3, [facility] 09/12/2018 4:18 PM

## 2018-09-12 NOTE — Progress Notes (Signed)
    Subjective:    Patient ID: Suzanne Ellis, female    DOB: 06-04-1985, 33 y.o.   MRN: [id number]   CC: BV?  HPI:  Has vaginal discharge again. Gets BV often and thinks its BV.  She also had some spotting a few weeks ago which is unusual for her. Bleeding occurred after using metrogel. It was bright red then faded to dark brown. She reports some brownish discharge still so thinks she's still bleeding. Not sexually active. Last active months ago. On depo for birth control. Had PID in the past, worried about recurrent BV causing issues in the long term for her.  Smoking status reviewed- former smoker  Review of Systems- no fevers, chills, pelvic pain, dysuria    Objective:  BP 140/75   Pulse (!) 110   Wt 178 lb (80.7 kg)   LMP 08/24/2018 (Exact Date) Comment: doesnt usually have period on depo  SpO2 98%   BMI 29.62 kg/m  Vitals and nursing note reviewed  General: well nourished, in no acute distress HEENT: normocephalic, mask in place Respiratory: no increased work of breathing Abdomen: soft, nontender GU: normal external female genitalia, moderate amount of thick white discharge present, cervix pink without lesions. No CMT. No adnexal masses Extremities: no edema or cyanosis.  Neuro: alert and oriented, no focal deficits   Assessment & Plan:   1. Vaginal discharge Wet prep+ for yeast, will treat w/ diflucan x 1. Gc/chlamydia sent to lab.  - POCT Wet Prep [facility]) - Cervicovaginal ancillary only  2. Spotting On depo for birth control, due for next shot in July. Upreg negative. No bleeding on exam. Follow up next month for depo. - POCT urine pregnancy   Return if symptoms worsen or fail to improve.   Lucila Maine, DO Family Medicine Resident PGY-3

## 2018-09-16 LAB — CERVICOVAGINAL ANCILLARY ONLY
Chlamydia: POSITIVE — AB
Neisseria Gonorrhea: NEGATIVE

## 2018-09-25 ENCOUNTER — Telehealth (INDEPENDENT_AMBULATORY_CARE_PROVIDER_SITE_OTHER): Payer: Medicaid Other | Admitting: Family Medicine

## 2018-09-25 ENCOUNTER — Other Ambulatory Visit: Payer: Self-pay

## 2018-09-25 ENCOUNTER — Telehealth: Payer: Self-pay | Admitting: *Deleted

## 2018-09-25 DIAGNOSIS — Z20822 Contact with and (suspected) exposure to covid-19: Secondary | ICD-10-CM | POA: Insufficient documentation

## 2018-09-25 DIAGNOSIS — Z20828 Contact with and (suspected) exposure to other viral communicable diseases: Secondary | ICD-10-CM | POA: Diagnosis not present

## 2018-09-25 NOTE — Telephone Encounter (Signed)
-----   Message from Caroline More, DO sent at 09/25/2018 10:02 AM EDT ----- COVID Drive-Up Test Referral Criteria  Patient age: 33 y.o.  Symptoms: Cough and Shortness of breath and headache  Underlying Conditions: No underlying conditions  Is the patient a first responder? No  Does the patient live or work in a high risk or high density environment: No  Is the patient a COVID convalescent patient who is 14-28 days symptom-free and interested in donating plasma for use as a therapeutic product? No  Patient has total of 4 family members who would need testing. Separate orders placed. Phone number to be reached: [phone]

## 2018-09-25 NOTE — Telephone Encounter (Signed)
Scheduled patient for COVID 19 test 09/26/2018 at [facility].  Testing protocol reviewed.

## 2018-09-25 NOTE — Assessment & Plan Note (Signed)
Patient with very close exposure to carbon positive patient.  All of her neighbors were tested COVID positive in the [place] positive child was in her house playing with her children.  Does report hugging this child.  Reports intermittent cough as well as headache.  Given close exposure we will plan to refer to COVID testing.  Epic message sent for drive up clinic.  They will be able to place those orders when needed.  Strict return precautions given.  Advised to go to the emergency room with any shortness of breath.  Patient should quarantine as well.  Follow-up as needed.

## 2018-09-25 NOTE — Progress Notes (Signed)
[place] Telemedicine Visit I connected with  DEAUNDRA DUPRIEST on 09/25/18 by a video enabled telemedicine application and verified that I am speaking with the correct person using two identifiers.   I discussed the limitations of evaluation and management by telemedicine. The patient expressed understanding and agreed to proceed.  Patient consented to have virtual visit. Method of visit: Video  Encounter participants: Patient: Suzanne Ellis - located at home Provider: Caroline More - located at [facility] Others (if applicable): None  Chief Complaint: exposure to COVID  HPI: Patient calling today for recent exposure to chlamydia positive patient.  Patient states that her and her family recently traveled to [facility] a few weeks ago.  Was told to self quarantine and during that quarantine states that she was exposed to a COVID positive patient who is currently in the hospital for [place].  Reports increased headaches since that time.  2 weeks ago patient was tested for COVID and was negative.  Since that time she had another exposure to a COVID positive patient.  States that her neighbors are all COVID positive and those children play with her children inside her house.  She says she is with those neighbors almost every day.  Has had a headache and intermittent cough.  Is a former smoker but quit a few months ago.  Last exposure to COVID positive patient's was yesterday.  Drive up referral: Patient age: 33 y.o.  Symptoms: Cough and Shortness of breath and headache  Underlying Conditions: No underlying conditions  Is the patient a first responder? No  Does the patient live or work in a high risk or high density environment: No  Is the patient a COVID convalescent patient who is 14-28 days symptom-free and interested in donating plasma for use as a therapeutic product? No  ROS: per HPI  Pertinent PMHx: migraine, arnold-chiari malformation, tobacco  use  Exam:  Respiratory: speaking full sentences, was walking throughout the house without shortness of breath, speaking full sentences   Assessment/Plan:  Close Exposure to Covid-19 Virus Patient with very close exposure to carbon positive patient.  All of her neighbors were tested COVID positive in the [place] positive child was in her house playing with her children.  Does report hugging this child.  Reports intermittent cough as well as headache.  Given close exposure we will plan to refer to COVID testing.  Epic message sent for drive up clinic.  They will be able to place those orders when needed.  Strict return precautions given.  Advised to go to the emergency room with any shortness of breath.  Patient should quarantine as well.  Follow-up as needed.    Time spent during visit with patient: 10 minutes  COVID Drive-Up Test Referral Criteria

## 2018-09-26 ENCOUNTER — Other Ambulatory Visit: Payer: Medicaid Other

## 2018-09-26 DIAGNOSIS — R6889 Other general symptoms and signs: Secondary | ICD-10-CM | POA: Diagnosis not present

## 2018-09-26 DIAGNOSIS — Z20822 Contact with and (suspected) exposure to covid-19: Secondary | ICD-10-CM

## 2018-09-29 ENCOUNTER — Ambulatory Visit: Payer: Medicaid Other

## 2018-09-29 ENCOUNTER — Telehealth: Payer: Self-pay | Admitting: Family Medicine

## 2018-09-29 MED ORDER — AZITHROMYCIN 500 MG PO TABS: 1000.0000 mg | ORAL_TABLET | Freq: Every day | ORAL | 0 refills | Status: AC

## 2018-09-29 NOTE — Telephone Encounter (Signed)
Report for health department completed and placed in "to be faxed" pile. Christen Bame, CMA

## 2018-09-29 NOTE — Telephone Encounter (Signed)
I happened to be going through Dr. Stefano Gaul inbox and found this positive Chlamydia result.  I immediately contacted the patient with test result and advised that she comes in today for treatment. I also advised her to have her partner tested and treated and to use condoms regularly for protect.  TOC recommended 3 weeks after treatment.  Appointment scheduled today at 10:30 AM.  All questions were answered.  NB: She stated that her symptoms has not changed since the last time she was seen and evaluated by Dr. Vanetta Shawl.  I will forward message to RN to report positive result to the health department.

## 2018-09-29 NOTE — Telephone Encounter (Signed)
Patient has pending COVID-19 test result. CMA advised to call her about our clinic policy not to see patient with pending result physically in our clinic.  Suzanne Ellis contacted her and informed her of this policy. We will escribe Zithromax to her pharmacy.  She is to return in 3 weeks for TOC.

## 2018-09-29 NOTE — Addendum Note (Signed)
Addended by: Andrena Mews T on: 09/29/2018 08:52 AM   Modules accepted: Orders

## 2018-09-30 LAB — NOVEL CORONAVIRUS, NAA: SARS-CoV-2, NAA: NOT DETECTED

## 2018-10-01 ENCOUNTER — Ambulatory Visit (INDEPENDENT_AMBULATORY_CARE_PROVIDER_SITE_OTHER): Payer: Medicaid Other

## 2018-10-01 ENCOUNTER — Other Ambulatory Visit: Payer: Self-pay

## 2018-10-01 DIAGNOSIS — Z30019 Encounter for initial prescription of contraceptives, unspecified: Secondary | ICD-10-CM

## 2018-10-01 MED ORDER — MEDROXYPROGESTERONE ACETATE 150 MG/ML IM SUSP
150.0000 mg | Freq: Once | INTRAMUSCULAR | Status: AC
  Administered 2018-10-01: 14:00:00 150 mg via INTRAMUSCULAR

## 2018-10-01 NOTE — Progress Notes (Signed)
Patient presents in nurse clinic for depo injection. Patient is within dates. Depo injection given LUOQ, site unremarkable. Patient due back for next injection, 9/30-10/14, reminder card given.

## 2018-10-06 ENCOUNTER — Telehealth: Payer: Self-pay | Admitting: *Deleted

## 2018-10-06 NOTE — Telephone Encounter (Signed)
Mom needs a letter that states that her and her 3 children were negative for covid before work will allow her to return.  I have verified that all 3 children were tested and came back nagative, their info is below:  Suzanne Ellis MRN: [id number]  Suzanne Ellis MRN: [id number]  Suzanne Ellis MRN: [id number]  Will forward to MD to write letter.  Mom will print from [place].  Christen Bame, CMA

## 2018-10-07 ENCOUNTER — Encounter: Payer: Self-pay | Admitting: Family Medicine

## 2018-10-07 NOTE — Telephone Encounter (Signed)
Pt called to check status.  She would liek to return to work asap. Christen Bame, CMA

## 2018-10-07 NOTE — Progress Notes (Signed)
Covid neg tests confimed by [place], CMA.  Letter written for work at patient's request  -Dr. Criss Rosales

## 2018-10-09 NOTE — Telephone Encounter (Signed)
LMOVM informing patient. Orval Dortch, CMA ? ?

## 2018-10-14 DIAGNOSIS — F341 Dysthymic disorder: Secondary | ICD-10-CM | POA: Diagnosis not present

## 2018-10-24 ENCOUNTER — Ambulatory Visit: Payer: Medicaid Other | Admitting: Family Medicine

## 2018-10-29 ENCOUNTER — Ambulatory Visit: Payer: Medicaid Other | Admitting: Family Medicine

## 2018-10-29 ENCOUNTER — Other Ambulatory Visit: Payer: Self-pay

## 2018-10-29 ENCOUNTER — Other Ambulatory Visit (HOSPITAL_COMMUNITY)
Admission: RE | Admit: 2018-10-29 | Discharge: 2018-10-29 | Disposition: A | Discharge status: Home / Self Care | Payer: Medicaid Other | Source: Ambulatory Visit | Attending: Family Medicine | Admitting: Family Medicine

## 2018-10-29 VITALS — BP 100/60 | HR 90

## 2018-10-29 DIAGNOSIS — B373 Candidiasis of vulva and vagina: Secondary | ICD-10-CM

## 2018-10-29 DIAGNOSIS — R5383 Other fatigue: Secondary | ICD-10-CM | POA: Diagnosis not present

## 2018-10-29 DIAGNOSIS — Z3202 Encounter for pregnancy test, result negative: Secondary | ICD-10-CM | POA: Diagnosis not present

## 2018-10-29 DIAGNOSIS — R1031 Right lower quadrant pain: Secondary | ICD-10-CM | POA: Diagnosis not present

## 2018-10-29 DIAGNOSIS — Z8619 Personal history of other infectious and parasitic diseases: Secondary | ICD-10-CM

## 2018-10-29 DIAGNOSIS — B3731 Acute candidiasis of vulva and vagina: Secondary | ICD-10-CM

## 2018-10-29 LAB — POCT URINALYSIS DIP (MANUAL ENTRY)
Bilirubin, UA: NEGATIVE
Glucose, UA: NEGATIVE mg/dL
Ketones, POC UA: NEGATIVE mg/dL
Leukocytes, UA: NEGATIVE
Nitrite, UA: NEGATIVE
Protein Ur, POC: NEGATIVE mg/dL
Spec Grav, UA: 1.025 (ref 1.010–1.025)
Urobilinogen, UA: 0.2 E.U./dL
pH, UA: 6 (ref 5.0–8.0)

## 2018-10-29 LAB — POCT WET PREP (WET MOUNT)
Clue Cells Wet Prep Whiff POC: NEGATIVE
Trichomonas Wet Prep HPF POC: ABSENT

## 2018-10-29 LAB — POCT URINE PREGNANCY: Preg Test, Ur: NEGATIVE

## 2018-10-29 MED ORDER — FLUCONAZOLE 150 MG PO TABS: 150.0000 mg | ORAL_TABLET | Freq: Once | ORAL | 0 refills | Status: AC

## 2018-10-29 NOTE — Progress Notes (Signed)
Subjective: Chief Complaint  Patient presents with   test of cure for chlamydia   abdominal pain and abnormal vaginal bleeding    on depo for years     HPI: Suzanne Ellis is a 33 y.o. presenting to clinic today to discuss the following:  Chlamydia Patient diagnosed with chlamydia on 7/13 and presents today for test of recurrence.  She states that she has not been sexually active since diagnosis and has had improvement in her discharge.    RLQ Abdominal Pain Patient reports on and off right lower quadrant abdominal pain with radiation to her right flank for the last few years.  She states that she has had this pain more frequently in the last few months.  Denies any fevers, chills, vomiting.  Does note some intermittent nausea, but does not think it is related to the pain, states that she has a history of migraines and that is usually when it occurs.  Ibuprofen improves the pain.  Describes the pain as a cramping sensation.  States that over the last few months, pain has been constant, just intermittently gets worse.  Unable to rate the pain.  Notes no aggravating factors, does state "I feel like I can feel it more when I lay down."  Denies any worsening of pain with urination.  Denies pain associated with bowel movements.  Denies hematuria, hematochezia, melena.  Fatigue Patient has complaints of fatigue for the last 2 months.  States "I am just so tired and do not want to do anything."  Notes that she has also had a large amount of weight gain recently, but states that she has been eating healthier since the COVID-19 pandemic.  Does report a history of MDD and PTSD, states that she receives therapy for this.  States "I am actually doing really well from that standpoint, I do not think it has anything to do with me feeling so tired."  Denies shortness of breath or chest pain.  States that she was concerned that she had COVID-19 and that was the cause of the symptoms, was tested "4 times"  per patient's report, all negative.  Two tests noted in chart that were negative.  Breakthrough bleeding Has been having breakthrough bleeding in the last few months.  Has been on Depo "for years" and not had this issue.  Pain does not change when she is bleeding.       ROS noted in HPI.   Past Medical, Surgical, Social, and Family History Reviewed & Updated per EMR.   Pertinent Historical Findings include:   Social History   Tobacco Use  Smoking Status Former Smoker   Packs/day: 0.50   Years: 10.00   Pack years: 5.00   Types: Cigarettes  Smokeless Tobacco Never Used      Objective: BP 100/60    Pulse 90    SpO2 96%  Vitals and nursing notes reviewed  Physical Exam:  General: 33 y.o. female in NAD Cardio: RRR no m/r/g Lungs: CTAB, no wheezing, no rhonchi, no crackles, no IWOB on RA Abdomen: Soft, TTP RLQ, non-distended, , no masses palpated, positive bowel sounds, right CVA tenderness, no tenderness to light palpation of right thoracic and lumbar spine Skin: warm and dry Extremities: No edema GU: Pelvic exam performed with patient supine.  Chaperone in room.  Bilateral labia without abnormalities, no inguinal LAD palpated.  Cervix exhibits brown discharge, no cervix abnormalities.  No vaginal lesions.  Vaginal discharge brown. Bimanual exam without cervical  motion tenderness, does have TTP over right lower quadrant/adnexa, no masses palpated.    Results for orders placed or performed in visit on 10/29/18 (from the past 72 hour(s))  POCT Wet Prep [facility] [place])     Status: Abnormal   Collection Time: 10/29/18  9:42 AM  Result Value Ref Range   Source Wet Prep POC vaginal    WBC, Wet Prep HPF POC 5-10    Bacteria Wet Prep HPF POC Moderate (A) Few   Clue Cells Wet Prep HPF POC None None   Clue Cells Wet Prep Whiff POC Negative Whiff    Yeast Wet Prep HPF POC Few (A) None   KOH Wet Prep POC None None   Trichomonas Wet Prep HPF POC Absent Absent  POCT urinalysis  dipstick     Status: Abnormal   Collection Time: 10/29/18  9:51 AM  Result Value Ref Range   Color, UA yellow yellow   Clarity, UA clear clear   Glucose, UA negative negative mg/dL   Bilirubin, UA negative negative   Ketones, POC UA negative negative mg/dL   Spec Grav, UA 1.025 1.010 - 1.025   Blood, UA trace-intact (A) negative   pH, UA 6.0 5.0 - 8.0   Protein Ur, POC negative negative mg/dL   Urobilinogen, UA 0.2 0.2 or 1.0 E.U./dL   Nitrite, UA Negative Negative   Leukocytes, UA Negative Negative  POCT urine pregnancy     Status: None   Collection Time: 10/29/18  9:52 AM  Result Value Ref Range   Preg Test, Ur Negative Negative    Assessment/Plan:  Right lower quadrant abdominal pain Worsening of chronic abdominal pain.  Patient does have tenderness to palpation of her right lower quadrant and right-sided CVA tenderness.  Does appear to have previous normal ultrasounds, but given worsening, and last ultrasound greater than 5 years ago, will repeat.  She does not have cervical motion tenderness and was recently treated for chlamydia, although could consider PID as cause.  Doubt pyelonephritis as patient without fever, UA negative for infection, also more chronic in nature.  Doubt kidney stone as is chronic in nature, although patient does have microscopic hematuria on UA, but is currently menstruating.  Urine pregnancy test negative.  Could also consider ruptured appendicitis causing scarring in the abdomen.  Will start with BMP, CBC, pelvic ultrasound.  If this is unrevealing, could consider proceeding with CT.  Could also consider somatic pain syndrome in the setting of history of MDD and PTSD.  Reassured that she is having normal bowel movements and bladder habits.  Doubt endometriosis as it is not cyclical in nature per patient's report.  Patient advised to follow-up in 2 weeks or sooner to continue to discuss work-up for this.  Advised to continue to take ibuprofen as needed for  pain.  Fatigue 2 months of worsening fatigue and weight gain.  Will obtain BMP, CBC, TSH.  Could also consider MDD and PTSD as cause, although patient states is doing well from the standpoint.  Would consider PHQ 9 and gad 7 at next visit if lab results unrevealing.  History of chlamydia Patient scheduled for test of cure, not indicated as patient not pregnant and reports compliance with medication, will test for test of recurrence and in setting of continued right lower quadrant pain.  Candida vaginitis Yeast on wet prep.  Sent Rx for fluconazole x1.     PATIENT EDUCATION PROVIDED: See AVS    Diagnosis and plan along with any newly  prescribed medication(s) were discussed in detail with this patient today. The patient verbalized understanding and agreed with the plan. Patient advised if symptoms worsen return to clinic or ER.   Health Maintainance:   Orders Placed This Encounter  Procedures   US Pelvic Complete With Transvaginal    [employer] well path Epic 178 lbs No needs Melody w/April ofc NEG7/10/20 Covid Test/No Public Trans/ No Fever, Cough, SOB/ No Close Contact w/pos Covid April ofc/ mask/ alone/ demo    Standing Status:   Future    Standing Expiration Date:   12/29/2019    Order Specific Question:   Reason for Exam (SYMPTOM  OR DIAGNOSIS REQUIRED)    Answer:   RLQ pain    Order Specific Question:   Preferred imaging location?    Answer:   [facility]   Basic Metabolic Panel   CBC with Differential   TSH   POCT urinalysis dipstick   POCT urine pregnancy   POCT Wet Prep [facility] [place]    Meds ordered this encounter  Medications   fluconazole (DIFLUCAN) 150 MG tablet    Sig: Take 1 tablet (150 mg total) by mouth once for 1 dose.    Dispense:  1 tablet    Refill:  0     Luis AbedBailey Laurianne Floresca, DO 10/29/2018, 10:49 AM PGY-2 [facility]

## 2018-10-29 NOTE — Assessment & Plan Note (Addendum)
Worsening of chronic abdominal pain.  Patient does have tenderness to palpation of her right lower quadrant and right-sided CVA tenderness.  Does appear to have previous normal ultrasounds, but given worsening, and last ultrasound greater than 5 years ago, will repeat.  She does not have cervical motion tenderness and was recently treated for chlamydia, although could consider PID as cause.  Doubt pyelonephritis as patient without fever, UA negative for infection, also more chronic in nature.  Doubt kidney stone as is chronic in nature, although patient does have microscopic hematuria on UA, but is currently menstruating.  Urine pregnancy test negative.  Could also consider ruptured appendicitis causing scarring in the abdomen.  Will start with BMP, CBC, pelvic ultrasound.  If this is unrevealing, could consider proceeding with CT.  Could also consider somatic pain syndrome in the setting of history of MDD and PTSD.  Reassured that she is having normal bowel movements and bladder habits.  Doubt endometriosis as it is not cyclical in nature per patient's report.  Patient advised to follow-up in 2 weeks or sooner to continue to discuss work-up for this.  Advised to continue to take ibuprofen as needed for pain.

## 2018-10-29 NOTE — Assessment & Plan Note (Addendum)
Patient scheduled for test of cure, not indicated as patient not pregnant and reports compliance with medication, will test for test of recurrence and in setting of continued right lower quadrant pain.

## 2018-10-29 NOTE — Assessment & Plan Note (Signed)
2 months of worsening fatigue and weight gain.  Will obtain BMP, CBC, TSH.  Could also consider MDD and PTSD as cause, although patient states is doing well from the standpoint.  Would consider PHQ 9 and gad 7 at next visit if lab results unrevealing.

## 2018-10-29 NOTE — Patient Instructions (Addendum)
Thank you for coming to see me today. It was a pleasure. Today we talked about:   Your pain: We will get an ultrasound.  You can continue to use ibuprofen as needed.  Your fatigue: We will check labs.  You have a yeast infection: I have sent medicine to your pharmacy, take one pill one time.    Your STD testing: We will let you know results.  Please follow-up with your primary doctor or me in 2 weeks or sooner as needed.  If you have any questions or concerns, please do not hesitate to call the office at [phone].  Best,   [place] Constable, DO

## 2018-10-29 NOTE — Assessment & Plan Note (Signed)
Yeast on wet prep.  Sent Rx for fluconazole x1.

## 2018-10-30 LAB — CBC WITH DIFFERENTIAL/PLATELET
Basophils Absolute: 0 10*3/uL (ref 0.0–0.2)
Basos: 0 %
EOS (ABSOLUTE): 0.1 10*3/uL (ref 0.0–0.4)
Eos: 0 %
Hematocrit: 40.1 % (ref 34.0–46.6)
Hemoglobin: 13.1 g/dL (ref 11.1–15.9)
Immature Grans (Abs): 0 10*3/uL (ref 0.0–0.1)
Immature Granulocytes: 0 %
Lymphocytes Absolute: 2.9 10*3/uL (ref 0.7–3.1)
Lymphs: 25 %
MCH: 28 pg (ref 26.6–33.0)
MCHC: 32.7 g/dL (ref 31.5–35.7)
MCV: 86 fL (ref 79–97)
Monocytes Absolute: 0.6 10*3/uL (ref 0.1–0.9)
Monocytes: 5 %
Neutrophils Absolute: 7.8 10*3/uL — ABNORMAL HIGH (ref 1.4–7.0)
Neutrophils: 70 %
Platelets: 206 10*3/uL (ref 150–450)
RBC: 4.68 x10E6/uL (ref 3.77–5.28)
RDW: 12.8 % (ref 11.7–15.4)
WBC: 11.4 10*3/uL — ABNORMAL HIGH (ref 3.4–10.8)

## 2018-10-30 LAB — BASIC METABOLIC PANEL
BUN/Creatinine Ratio: 18 (ref 9–23)
BUN: 11 mg/dL (ref 6–20)
CO2: 19 mmol/L — ABNORMAL LOW (ref 20–29)
Calcium: 9.3 mg/dL (ref 8.7–10.2)
Chloride: 105 mmol/L (ref 96–106)
Creatinine, Ser: 0.61 mg/dL (ref 0.57–1.00)
GFR calc Af Amer: 137 mL/min/{1.73_m2} (ref >59)
GFR calc non Af Amer: 119 mL/min/{1.73_m2} (ref >59)
Glucose: 83 mg/dL (ref 65–99)
Potassium: 4.5 mmol/L (ref 3.5–5.2)
Sodium: 138 mmol/L (ref 134–144)

## 2018-10-30 LAB — TSH: TSH: 0.62 u[IU]/mL (ref 0.450–4.500)

## 2018-10-31 ENCOUNTER — Telehealth: Payer: Self-pay

## 2018-10-31 LAB — CERVICOVAGINAL ANCILLARY ONLY
Chlamydia: NEGATIVE
Neisseria Gonorrhea: NEGATIVE

## 2018-10-31 NOTE — Telephone Encounter (Signed)
Patient informed of normal results.

## 2018-10-31 NOTE — Telephone Encounter (Signed)
Informed her of normal results.

## 2018-10-31 NOTE — Telephone Encounter (Signed)
LVM for pt to return call to our office. Ottis Stain, CMA

## 2018-10-31 NOTE — Telephone Encounter (Signed)
-----   Message from Cleophas Dunker, DO sent at 10/30/2018  9:13 PM EDT ----- Please let patient know that her labs are normal.

## 2018-11-03 ENCOUNTER — Telehealth: Payer: Self-pay

## 2018-11-03 NOTE — Telephone Encounter (Signed)
Melody with [facility] leaves VM on nurse line stating the patient has an apt tomorrow with them. Melody states the diagnoses for patient ultrasound is not sufficient enough. Melody states the diagnoses is "pain," however they need to know if [place] was ordered to rule out ovarian cyst/and or fibroids. Please call her back with this information.   [phone] ext [phone]  Will forward to [facility] since [place] is out today.

## 2018-11-04 ENCOUNTER — Telehealth: Payer: Self-pay

## 2018-11-04 ENCOUNTER — Other Ambulatory Visit: Payer: Medicaid Other

## 2018-11-04 DIAGNOSIS — F341 Dysthymic disorder: Secondary | ICD-10-CM | POA: Diagnosis not present

## 2018-11-04 NOTE — Telephone Encounter (Signed)
Pt informed. Suzanne Ellis T Colinda Barth, CMA  

## 2018-11-04 NOTE — Telephone Encounter (Signed)
-----   Message from Cleophas Dunker, DO sent at 11/04/2018  7:40 AM EDT ----- Please inform patient of negative result

## 2018-11-10 ENCOUNTER — Other Ambulatory Visit: Payer: Medicaid Other

## 2018-11-12 ENCOUNTER — Ambulatory Visit
Admission: RE | Admit: 2018-11-12 | Discharge: 2018-11-12 | Disposition: A | Discharge status: Home / Self Care | Payer: Medicaid Other | Source: Ambulatory Visit | Attending: Family Medicine | Admitting: Family Medicine

## 2018-11-12 DIAGNOSIS — R1031 Right lower quadrant pain: Secondary | ICD-10-CM | POA: Diagnosis not present

## 2018-11-20 ENCOUNTER — Ambulatory Visit: Payer: Medicaid Other | Admitting: Neurology

## 2018-11-21 DIAGNOSIS — F411 Generalized anxiety disorder: Secondary | ICD-10-CM | POA: Diagnosis not present

## 2018-11-21 DIAGNOSIS — F4310 Post-traumatic stress disorder, unspecified: Secondary | ICD-10-CM | POA: Diagnosis not present

## 2018-11-21 DIAGNOSIS — F341 Dysthymic disorder: Secondary | ICD-10-CM | POA: Diagnosis not present
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
## 2018-11-26 ENCOUNTER — Encounter: Payer: Self-pay | Admitting: Neurology

## 2018-11-26 ENCOUNTER — Other Ambulatory Visit: Payer: Self-pay

## 2018-11-26 ENCOUNTER — Ambulatory Visit (INDEPENDENT_AMBULATORY_CARE_PROVIDER_SITE_OTHER): Payer: Medicaid Other | Admitting: Neurology

## 2018-11-26 DIAGNOSIS — G43009 Migraine without aura, not intractable, without status migrainosus: Secondary | ICD-10-CM | POA: Diagnosis not present

## 2018-11-26 MED ORDER — SUMATRIPTAN SUCCINATE 100 MG PO TABS: ORAL_TABLET | ORAL | 3 refills | Status: DC

## 2018-11-26 MED ORDER — ONDANSETRON HCL 4 MG PO TABS: 4.0000 mg | ORAL_TABLET | Freq: Three times a day (TID) | ORAL | 3 refills | Status: DC | PRN

## 2018-11-26 NOTE — Patient Instructions (Signed)
1.  Continue the Effexor as it is also used to prevent migraines.  If headaches not improved in the next 3 weeks, then contact the prescribing doctor to see if the dose can be increased to 75mg  daily 2.  At earliest onset of migraine, take sumatriptan 100mg .  May repeat dose after 2 hours if needed (maximum 2 tablets in 24 hours) 3.  Use ondansetron 4mg  for nausea 4.  Limit use of pain relievers to no more than 2 days out of week to prevent risk of rebound or medication-overuse headache. 5.  Follow up in 4 months.

## 2018-12-09 ENCOUNTER — Other Ambulatory Visit: Payer: Self-pay

## 2018-12-09 ENCOUNTER — Other Ambulatory Visit: Payer: Self-pay | Admitting: Family Medicine

## 2018-12-09 ENCOUNTER — Other Ambulatory Visit (HOSPITAL_COMMUNITY)
Admission: RE | Admit: 2018-12-09 | Discharge: 2018-12-09 | Disposition: A | Discharge status: Home / Self Care | Payer: Medicaid Other | Source: Ambulatory Visit | Attending: Family Medicine | Admitting: Family Medicine

## 2018-12-09 ENCOUNTER — Encounter: Payer: Self-pay | Admitting: Family Medicine

## 2018-12-09 ENCOUNTER — Ambulatory Visit (INDEPENDENT_AMBULATORY_CARE_PROVIDER_SITE_OTHER): Payer: Medicaid Other | Admitting: Family Medicine

## 2018-12-09 VITALS — BP 122/72 | HR 104

## 2018-12-09 DIAGNOSIS — Z202 Contact with and (suspected) exposure to infections with a predominantly sexual mode of transmission: Secondary | ICD-10-CM | POA: Diagnosis not present

## 2018-12-09 DIAGNOSIS — N898 Other specified noninflammatory disorders of vagina: Secondary | ICD-10-CM

## 2018-12-09 LAB — POCT WET PREP (WET MOUNT)
Clue Cells Wet Prep Whiff POC: POSITIVE
Trichomonas Wet Prep HPF POC: ABSENT

## 2018-12-09 MED ORDER — FLUCONAZOLE 150 MG PO TABS: 150.0000 mg | ORAL_TABLET | Freq: Once | ORAL | 0 refills | Status: AC

## 2018-12-09 MED ORDER — METRONIDAZOLE 500 MG PO TABS: 500.0000 mg | ORAL_TABLET | Freq: Two times a day (BID) | ORAL | 0 refills | Status: DC

## 2018-12-09 NOTE — Progress Notes (Signed)
Subjective: Chief Complaint  Patient presents with  . BV   HPI: Suzanne Ellis is a 33 y.o. presenting to clinic today to discuss the following:  Vaginal Discharge Patient endorses 3 days of odor and vaginal discharge. No other symptoms. She has been using metronidazole gel a couple of times and says she thinks it makes her bleed. No pain, nausea, vomiting, fever, or chills. Just vaginal discharge and odor. She requests to be tested for STDs as well.    ROS noted in HPI.    Social History   Tobacco Use  Smoking Status Former Smoker  . Packs/day: 0.50  . Years: 10.00  . Pack years: 5.00  . Types: Cigarettes  Smokeless Tobacco Never Used    Objective: BP 122/72   Pulse (!) 104   SpO2 99%  Vitals and nursing notes reviewed  Physical Exam Vitals signs reviewed. Exam conducted with a chaperone present.  Constitutional:      General: She is not in acute distress.    Appearance: Normal appearance. She is not ill-appearing.  Genitourinary:    General: Normal vulva.     Exam position: Supine.     Pubic Area: No rash.      Labia:        Right: No rash, tenderness, lesion or injury.        Left: No rash, tenderness, lesion or injury.      Vagina: No signs of injury. Vaginal discharge and bleeding present. No erythema or tenderness.     Cervix: Normal.     Uterus: Normal.      Adnexa: Right adnexa normal and left adnexa normal.  Skin:    General: Skin is warm and dry.     Findings: No erythema, lesion or rash.  Neurological:     Mental Status: She is alert.    Results for orders placed or performed in visit on 12/09/18 (from the past 72 hour(s))  POCT Wet Prep [facility] [place])     Status: Abnormal   Collection Time: 12/09/18  3:00 PM  Result Value Ref Range   Source Wet Prep POC VAG    WBC, Wet Prep HPF POC 1-3    Bacteria Wet Prep HPF POC Moderate (A) Few   Clue Cells Wet Prep HPF POC Moderate (A) None   Clue Cells Wet Prep Whiff POC Positive Whiff    Yeast Wet  Prep HPF POC None None   Trichomonas Wet Prep HPF POC Absent Absent    Assessment/Plan:  Vaginal discharge Patient presents with 3 days of vaginal discharge and abnormal odor. Positive for BV on wet prep - Follow up GC/CC, HIV, and RPR - Metronidazole 500mg  BID for 7 days - Diflucan 150mg  once to prevent yeast infection form antibiotic use   PATIENT EDUCATION PROVIDED: See AVS    Diagnosis and plan along with any newly prescribed medication(s) were discussed in detail with this patient today. The patient verbalized understanding and agreed with the plan. Patient advised if symptoms worsen return to clinic or ER.     Orders Placed This Encounter  Procedures  . HIV antibody (with reflex)  . RPR  . POCT Wet Prep [facility])    Meds ordered this encounter  Medications  . metroNIDAZOLE (FLAGYL) 500 MG tablet    Sig: Take 1 tablet (500 mg total) by mouth 2 (two) times daily.    Dispense:  21 tablet    Refill:  0  . fluconazole (DIFLUCAN) 150  MG tablet    Sig: Take 1 tablet (150 mg total) by mouth once for 1 dose.    Dispense:  1 tablet    Refill:  0     Jules Schick, DO 12/09/2018, 2:50 PM PGY-3 [facility]

## 2018-12-09 NOTE — Patient Instructions (Signed)

## 2018-12-09 NOTE — Assessment & Plan Note (Signed)
Patient presents with 3 days of vaginal discharge and abnormal odor. Positive for BV on wet prep - Follow up GC/CC, HIV, and RPR - Metronidazole 500mg  BID for 7 days - Diflucan 150mg  once to prevent yeast infection form antibiotic use

## 2018-12-11 LAB — CERVICOVAGINAL ANCILLARY ONLY
Chlamydia: NEGATIVE
Neisseria Gonorrhea: NEGATIVE

## 2018-12-23 ENCOUNTER — Telehealth: Payer: Self-pay

## 2018-12-23 NOTE — Telephone Encounter (Signed)
Patient calls nurse line stating she has completed course of treatment for bacterial vaginosis, however she is still experiencing symptoms. Patient stated she still has a thick discharge with a foul odor. Patient was adamant that she hasn't been sexually active, no new soaps/dtergents, and she has not had a period. Patient would prefer not to come in again, hoping to have another round of flagyl called in. Will forward to provider who saw patient.

## 2018-12-24 ENCOUNTER — Other Ambulatory Visit: Payer: Self-pay | Admitting: Family Medicine

## 2018-12-24 MED ORDER — METRONIDAZOLE 500 MG PO TABS: 500.0000 mg | ORAL_TABLET | Freq: Two times a day (BID) | ORAL | 0 refills | Status: DC

## 2018-12-24 NOTE — Telephone Encounter (Signed)
Patient informed of another round of medication to her pharmacy.

## 2018-12-24 NOTE — Progress Notes (Unsigned)
Patient called LVM stating she is still having symptoms. Will treat again with Metronidazole and if refractory again I will have her return to clinic.

## 2019-01-07 ENCOUNTER — Telehealth: Payer: Self-pay | Admitting: Family Medicine

## 2019-01-07 ENCOUNTER — Other Ambulatory Visit: Payer: Self-pay

## 2019-01-07 ENCOUNTER — Encounter (HOSPITAL_COMMUNITY): Payer: Self-pay

## 2019-01-07 ENCOUNTER — Ambulatory Visit (HOSPITAL_COMMUNITY)
Admission: EM | Admit: 2019-01-07 | Discharge: 2019-01-07 | Disposition: A | Discharge status: Home / Self Care | Payer: Medicaid Other | Attending: Family Medicine | Admitting: Family Medicine

## 2019-01-07 DIAGNOSIS — R519 Headache, unspecified: Secondary | ICD-10-CM | POA: Diagnosis not present

## 2019-01-07 MED ORDER — DEXAMETHASONE SODIUM PHOSPHATE 10 MG/ML IJ SOLN
10.0000 mg | Freq: Once | INTRAMUSCULAR | Status: AC
  Administered 2019-01-07: 20:00:00 10 mg via INTRAMUSCULAR

## 2019-01-07 MED ORDER — METOCLOPRAMIDE HCL 5 MG/ML IJ SOLN
5.0000 mg | Freq: Once | INTRAMUSCULAR | Status: AC
  Administered 2019-01-07: 5 mg via INTRAMUSCULAR

## 2019-01-07 MED ORDER — KETOROLAC TROMETHAMINE 60 MG/2ML IM SOLN
60.0000 mg | Freq: Once | INTRAMUSCULAR | Status: AC
  Administered 2019-01-07: 20:00:00 60 mg via INTRAMUSCULAR

## 2019-01-07 MED ORDER — DEXAMETHASONE SODIUM PHOSPHATE 10 MG/ML IJ SOLN
INTRAMUSCULAR | Status: AC
  Filled 2019-01-07: qty 1

## 2019-01-07 MED ORDER — KETOROLAC TROMETHAMINE 60 MG/2ML IM SOLN
INTRAMUSCULAR | Status: AC
  Filled 2019-01-07: qty 2

## 2019-01-07 MED ORDER — METOCLOPRAMIDE HCL 5 MG/ML IJ SOLN
INTRAMUSCULAR | Status: AC
  Filled 2019-01-07: qty 2

## 2019-01-07 NOTE — ED Triage Notes (Signed)
Pt presents with ongoing migraine that is causing neck pain, nausea and vomiting X 3 days.

## 2019-01-07 NOTE — Discharge Instructions (Signed)
Please seek prompt medical care if: You have: Trouble walking or weakness in your arms and legs. Clear or bloody fluid coming from your nose or ears. Changes in your seeing (vision). Jerky movements that you cannot control (seizure). Your symptoms get worse. You lose balance. Your speech is slurred. You pass out. You are sleepier and have trouble staying awake. The black centers of your eyes (pupils) change in size.  These symptoms may be an emergency. Do not wait to see if the symptoms will go away. Get medical help right away. Call your local emergency services. Do not drive yourself to the hospital.

## 2019-01-07 NOTE — Telephone Encounter (Signed)
[facility] Visit  Patient consented to have virtual visit. Method of visit: Telephone  Encounter participants: Patient: Suzanne Ellis - Driving home from work Provider: Matilde Haymaker - located at [facility] Others (if applicable): none  Chief Complaint: Severe headache for 3 days  HPI: Headache with vision changes and nausea Ms. Hockman reports that she has been having a migraine for the past 3 days.  She has history of migraines and reports that this feels worse than her typical migraine.  She is currently having pain around the base of her neck, the top of her head and her forehead.  The pain covers her entire head and is not isolated to a single side.  She also notes occasional blurred vision during the past 3 days.  In the past day, she has begun to experience nausea but has not had any episodes of vomiting.  She has tried taking sumatriptan as prescribed for the past 2 days without any pain relief.  She is calling to see if she can be prescribed any different medication to help stop this headache.  She is unsure if she has had any fevers in the past several days but has had multiple periods of feeling sweaty and hot where she needed to turn the fan on.  She cannot say with confidence that this feels similar to previous episodes of migraine.  She reports that she was driving home from work which she left early due to her significant discomfort.  ROS: per HPI  Pertinent PMHx: History of migraine  Exam:  Respiratory: Breathing comfortably on room air.  Able to complete long sentences without effort.  Assessment/Plan:  Headache with vision changes and nausea Differential includes migraine headache, tension headache, cluster headache, and possible infection.  Based on her history, this may be a bad presentation of her typical migraine though it is very difficult to be certain over the phone.  Her symptoms of vision changes, neck pain and nausea  make me concerned about a possible meningitis.  Meningitis does seem unlikely based on the fact that she was at work today and was driving home at the time of our phone call.  She was told that she should be assessed at an urgent care so that she could be seen in person to evaluate whether there is any true concern for infectious cause of her ongoing severe headache.  If possible source of infection can be ruled out Fioricet may be helpful for abortive treatment of this migraine.  Time spent during visit with patient: 15 minutes

## 2019-01-10 NOTE — ED Provider Notes (Signed)
[facility]   [id number] 01/07/19 Arrival Time: 1854  ASSESSMENT & PLAN:  1. Bad headache   Intractable migraine.  Given: Meds ordered this encounter  Medications   ketorolac (TORADOL) injection 60 mg   metoCLOPramide (REGLAN) injection 5 mg   dexamethasone (DECADRON) injection 10 mg   Normal neurological exam. Afebrile without nuchal rigidity. Discussed. Current presentation and symptoms are consistent with prior migraines and are not consistent with SAH, ICH, meningitis, or temporal arteritis. Without fever, focal neuro logical deficits, nuchal rigidity, or change in vision. No indication for urgent neurodiagnostic workup at this time. Discussed.  Recommend: Follow-up Information    [facility] EMERGENCY DEPARTMENT.   Specialty: Emergency Medicine Why: If symptoms worsen in any way. Contact information: [address] [id number] mc [place] [address] [phone]         Ensure adequate hydration and rest.  Reviewed expectations re: course of current medical issues. Questions answered. Outlined signs and symptoms indicating need for more acute intervention. Patient verbalized understanding. After Visit Summary given.   SUBJECTIVE: History from: patient. Patient is able to give a clear and coherent history.  Suzanne Ellis is a 33 y.o. female who presents with complaint of a migraine headache. Onset gradual, over the past 2-3 days. Location: frontal with some radiation to occipital area; also with posterior neck discomfort. History of headaches: yes; migraines with similar symptoms. Precipitating factors include: none which have been determined. Associated symptoms: Preceding aura: no. Nausea/vomiting: mild to moderate nausea with occasional emesis. Increased sensitivity to light and to noises: mild. Fever: no. Sinus pressure/congestion: no. Extremity weakness: no. Home treatment has included Imitrex oral with  little improvement. Current headache has limited normal daily activities. Denies depression, dizziness, loss of balance, muscle weakness, numbness of extremities, speech difficulties and vision problems. No head injury reported. Ambulatory without difficulty. No recent travel. Not the worst HA of her life.  ROS: As per HPI. All other systems negative.    OBJECTIVE:  Vitals:   01/07/19 1909  BP: 134/86  Pulse: 90  Resp: 16  Temp: 98.6 F (37 C)  TempSrc: Temporal  SpO2: 95%    General appearance: alert; NAD; prefers darkened room; appears fatigued HENT: normocephalic; atraumatic Eyes: PERRLA; EOMI; conjunctivae normal Neck: supple with FROM Lungs: clear to auscultation bilaterally; unlabored respirations Heart: regular rate and rhythm Extremities: no edema; symmetrical with no gross deformities Skin: warm and dry Neurologic: alert; speech is fluent and clear without dysarthria or aphasia; CN 2-12 grossly intact; no facial droop; normal gait; normal symmetric reflexes; normal extremity strength and sensation throughout; bilateral upper and lower extremity sensation is grossly intact with 5/5 symmetric strength Psychological: alert and cooperative; normal mood and affect  Allergies  Allergen Reactions   Lamictal [Lamotrigine] Rash    Past Medical History:  Diagnosis Date   Arnold-Chiari malformation, 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   CHLAMYDTRACHOMATIS INFECTION LOWER GU SITES 07/28/2008   Qualifier: Diagnosis of  By: Jake Shark RN, Amy     No pertinent past medical history    Social History   Socioeconomic History   Marital status: Single    Spouse name: Not on file   Number of children: 3   Years of education: 14   Highest education level: Associate degree: academic program  Occupational History    Employer: [employer]   Occupation: bartender    Comment: Kick Administrator, sports  Social Needs   Financial resource strain: Not on file   Food insecurity  Worry: Not on file     Inability: Not on file   Transportation needs    Medical: Not on file    Non-medical: Not on file  Tobacco Use   Smoking status: Former Smoker    Packs/day: 0.50    Years: 10.00    Pack years: 5.00    Types: Cigarettes   Smokeless tobacco: Never Used  Substance and Sexual Activity   Alcohol use: Yes    Comment: occasionally    Drug use: No   Sexual activity: Never    Birth control/protection: None, Injection  Lifestyle   Physical activity    Days per week: Not on file    Minutes per session: Not on file   Stress: Not on file  Relationships   Social connections    Talks on phone: Not on file    Gets together: Not on file    Attends religious service: Not on file    Active member of club or organization: Not on file    Attends meetings of clubs or organizations: Not on file    Relationship status: Not on file   Intimate partner violence    Fear of current or ex partner: Not on file    Emotionally abused: Not on file    Physically abused: Not on file    Forced sexual activity: Not on file  Other Topics Concern   Not on file  Social History Narrative   Lives at home w/ her sister and 3 daughters   Right-handed   Caffeine: 0-1 per day   Associates degree   Family History  Problem Relation Age of Onset   Hypertension Mother    Cancer Mother        lung   Migraines Mother    Anesthesia problems Other        MGF allergic to a common type of anesthesia   Cancer Maternal Grandmother        skin   Diabetes Maternal Grandmother    Cervical cancer Sister    Migraines Sister    Seizures Sister    Hypotension Neg Hx    Malignant hyperthermia Neg Hx    Pseudochol deficiency Neg Hx    Past Surgical History:  Procedure Laterality Date   ARM WOUND REPAIR / [place]     termination     WISDOM TOOTH EXTRACTION       Vanessa Kick, MD 01/10/19 [phone]

## 2019-01-19 ENCOUNTER — Telehealth: Payer: Self-pay | Admitting: Family Medicine

## 2019-01-19 DIAGNOSIS — R52 Pain, unspecified: Secondary | ICD-10-CM

## 2019-01-19 NOTE — Telephone Encounter (Signed)
FPTS After Hours Emergency Line Note  Ms. Stempel calls the after-hours line due to new onset body aches after attending a Halloween party with about 12 other people 2 days ago.  She denies cough and shortness of breath but does endorse postnasal drainage.  She is worried that she could also have an infection such as meningitis or strep throat since she recently had a headache which was treated by urgent care on 10/21.  I reassured patient that meningitis and strep throat are unlikely given her symptoms, but you will put in the order for her to get tested for Covid at the drive-through site at [facility].  Instructions were given for this testing.  If she tests negative, she can call and make an appointment for further assessment at our clinic.  She expressed understanding with this plan.  Amanda C. Shan Levans, MD PGY-3, [facility] 01/19/2019 3:49 AM

## 2019-01-20 ENCOUNTER — Other Ambulatory Visit: Payer: Self-pay

## 2019-01-20 DIAGNOSIS — Z20828 Contact with and (suspected) exposure to other viral communicable diseases: Secondary | ICD-10-CM | POA: Diagnosis not present

## 2019-01-20 DIAGNOSIS — Z20822 Contact with and (suspected) exposure to covid-19: Secondary | ICD-10-CM

## 2019-01-20 NOTE — Telephone Encounter (Signed)
Thank you Page.  She may return to work on Friday, 11/6 if her COVID result is negative.  If it is positive, she will need a new note from [place] to extend her out of work time.

## 2019-01-20 NOTE — Telephone Encounter (Signed)
Patient calls nurse line stating she went to get tested for covid today. Patient stated the results will not be back for 3-4 days. Patient needs a note for work and school. Please advise.

## 2019-01-21 ENCOUNTER — Telehealth: Payer: Self-pay | Admitting: Family Medicine

## 2019-01-21 ENCOUNTER — Encounter: Payer: Self-pay | Admitting: Family Medicine

## 2019-01-21 LAB — NOVEL CORONAVIRUS, NAA: SARS-CoV-2, NAA: DETECTED — AB

## 2019-01-21 NOTE — Telephone Encounter (Addendum)
Two letters written for patient. Patient will access though mychart.

## 2019-01-21 NOTE — Telephone Encounter (Signed)
FPTS After Hours Emergency Line Note  [name] calls to ask for the results of her 2 children's Covid test after she tested positive.  She was informed that both of her children have tested negative.  She also asked how long she should expect to have symptoms and what symptoms should prompt her to go to the emergency department.  We discussed that she is in a low risk group so hopefully her symptoms should last for less than 2 weeks, although there is a wide spectrum of symptoms.  She was also told that she should go to the emergency department if she experiences shortness of breath or chest pain.  I will provide her a work note to span her length of quarantine as well.  She was encouraged to call our clinic if she has any further questions.  Suzanne Maclellan C. Shan Levans, MD PGY-3, [facility] 01/21/2019 9:27 PM

## 2019-01-29 ENCOUNTER — Ambulatory Visit: Payer: Medicaid Other

## 2019-01-30 ENCOUNTER — Ambulatory Visit (INDEPENDENT_AMBULATORY_CARE_PROVIDER_SITE_OTHER): Payer: Medicaid Other | Admitting: *Deleted

## 2019-01-30 ENCOUNTER — Other Ambulatory Visit: Payer: Self-pay

## 2019-01-30 DIAGNOSIS — Z309 Encounter for contraceptive management, unspecified: Secondary | ICD-10-CM

## 2019-01-30 LAB — POCT URINE PREGNANCY: Preg Test, Ur: NEGATIVE

## 2019-01-30 MED ORDER — MEDROXYPROGESTERONE ACETATE 150 MG/ML IM SUSY: PREFILLED_SYRINGE | Freq: Once | INTRAMUSCULAR | Status: DC

## 2019-01-30 MED ORDER — MEDROXYPROGESTERONE ACETATE 150 MG/ML IM SUSY
150.0000 mg | PREFILLED_SYRINGE | Freq: Once | INTRAMUSCULAR | Status: AC
  Administered 2019-01-30: 12:00:00 150 mg via INTRAMUSCULAR

## 2019-02-02 ENCOUNTER — Encounter: Payer: Self-pay | Admitting: Family Medicine

## 2019-02-02 ENCOUNTER — Other Ambulatory Visit: Payer: Self-pay | Admitting: Family Medicine

## 2019-02-02 MED ORDER — METRONIDAZOLE 500 MG PO TABS: 500.0000 mg | ORAL_TABLET | Freq: Two times a day (BID) | ORAL | 0 refills | Status: DC

## 2019-02-02 MED ORDER — FLUCONAZOLE 150 MG PO TABS: 150.0000 mg | ORAL_TABLET | Freq: Once | ORAL | 0 refills | Status: AC

## 2019-02-02 NOTE — Progress Notes (Signed)
Patient left me a message via MyChart saying she has another case of symptomatic BV that I had just finished treating her for. Sending in another round of Metronidazole and Diflucan for subsequent yeast infection 2/2 antibiotics.   If she has another case of BV I recommended to patient via MyChart to see OB/GYN.

## 2019-02-03 ENCOUNTER — Other Ambulatory Visit: Payer: Self-pay | Admitting: Family Medicine

## 2019-02-03 DIAGNOSIS — N898 Other specified noninflammatory disorders of vagina: Secondary | ICD-10-CM

## 2019-02-03 NOTE — Progress Notes (Signed)
Patient with another round of vaginal discharge consistent with BV symtpoms she has had in the past. She has had 3 episodes of BV in the past 9 months all treated with metronidazole but soon return. Sending referral to OB/GYN to see if they have any recommendations about preventing recurring BV.

## 2019-03-23 ENCOUNTER — Other Ambulatory Visit: Payer: Self-pay

## 2019-03-23 ENCOUNTER — Ambulatory Visit (INDEPENDENT_AMBULATORY_CARE_PROVIDER_SITE_OTHER): Payer: Medicaid Other | Admitting: Obstetrics & Gynecology

## 2019-03-23 ENCOUNTER — Other Ambulatory Visit (HOSPITAL_COMMUNITY)
Admission: RE | Admit: 2019-03-23 | Discharge: 2019-03-23 | Disposition: A | Discharge status: Home / Self Care | Payer: Medicaid Other | Source: Ambulatory Visit | Attending: Obstetrics & Gynecology | Admitting: Obstetrics & Gynecology

## 2019-03-23 ENCOUNTER — Encounter: Payer: Self-pay | Admitting: Obstetrics & Gynecology

## 2019-03-23 VITALS — BP 106/71 | HR 98 | Ht 65.0 in | Wt 180.1 lb

## 2019-03-23 DIAGNOSIS — B9689 Other specified bacterial agents as the cause of diseases classified elsewhere: Secondary | ICD-10-CM | POA: Diagnosis not present

## 2019-03-23 DIAGNOSIS — Z01419 Encounter for gynecological examination (general) (routine) without abnormal findings: Secondary | ICD-10-CM | POA: Insufficient documentation

## 2019-03-23 DIAGNOSIS — N760 Acute vaginitis: Secondary | ICD-10-CM | POA: Diagnosis not present

## 2019-03-23 MED ORDER — CLINDAMYCIN PHOSPHATE 100 MG VA SUPP: 100.0000 mg | Freq: Every day | VAGINAL | 0 refills | Status: DC

## 2019-03-23 NOTE — Progress Notes (Signed)
Patient ID: Suzanne Ellis, female   DOB: [birth year], 34 y.o.   MRN: [id number]  Chief Complaint  Patient presents with  . Gynecologic Exam    HPI Suzanne Ellis is a 34 y.o. female.  [id number] No LMP recorded. Patient has had an injection. She had LSIL pap 2018, repeat pap 2019 was negative. She has been treated multiple times for BV with metronidazole. She may have yeast sx today. HPI  Past Medical History:  Diagnosis Date  . 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 CHLAMYDTRACHOMATIS INFECTION LOWER GU SITES 07/28/2008   Qualifier: Diagnosis of  By: Erin Hearing RN, Amy    . No pertinent past medical history     Past Surgical History:  Procedure Laterality Date  . ARM WOUND REPAIR / CLOSURE    . DILATION AND CURETTAGE OF UTERUS    . termination    . WISDOM TOOTH EXTRACTION      Family History  Problem Relation Age of Onset  . Hypertension Mother   . Cancer Mother        lung  . Migraines Mother   . Anesthesia problems Other        MGF allergic to a common type of anesthesia  . Cancer Maternal Grandmother        skin  . Diabetes Maternal Grandmother   . Cervical cancer Sister   . Migraines Sister   . Seizures Sister   . Hypotension Neg Hx   . Malignant hyperthermia Neg Hx   . Pseudochol deficiency Neg Hx     Social History Social History   Tobacco Use  . Smoking status: Former Smoker    Packs/day: 0.50    Years: 10.00    Pack years: 5.00    Types: Cigarettes  . Smokeless tobacco: Never Used  Substance Use Topics  . Alcohol use: Yes    Comment: occasionally   . Drug use: No    Allergies  Allergen Reactions  . Lamictal [Lamotrigine] Rash    Current Outpatient Medications  Medication Sig Dispense Refill  . HYDROcodone-acetaminophen (NORCO) 5-325 MG tablet Take 1-2 tablets by mouth every 6 (six) hours as needed. 15 tablet 0  . hydrOXYzine (ATARAX/VISTARIL) 10 MG tablet     . medroxyPROGESTERone (DEPO-PROVERA) 150 MG/ML injection Inject 150 mg  into the muscle every 3 (three) months.    . Multiple Vitamin (MULTIVITAMIN) tablet Take 1 tablet by mouth daily.    . SUMAtriptan (IMITREX) 100 MG tablet Take 1 tablet earliest onset of migraine.  May repeat in 2 hours if headache persists or recurs.  Maximum 2 tablets in 24 hours 10 tablet 3  . venlafaxine XR (EFFEXOR-XR) 37.5 MG 24 hr capsule TK 1 C PO QD    . baclofen (LIORESAL) 10 MG tablet Take 1 tablet (10 mg total) by mouth 3 (three) times daily. (Patient not taking: Reported on 11/26/2018) 30 each 0  . clindamycin (CLEOCIN) 100 MG vaginal suppository Place 1 suppository (100 mg total) vaginally at bedtime. 3 suppository 0  . ibuprofen (ADVIL,MOTRIN) 200 MG tablet Take 400 mg by mouth every 6 (six) hours as needed for moderate pain.     . naproxen (NAPROSYN) 500 MG tablet Take 1 tablet (500 mg total) by mouth 2 (two) times daily with a meal. (Patient not taking: Reported on 11/26/2018) 30 tablet 0  . naproxen (NAPROSYN) 500 MG tablet Take 1 tablet (500 mg total) by mouth 2 (two) times daily with a meal. (Patient not taking: Reported  on 11/26/2018) 30 tablet 0  . ondansetron (ZOFRAN) 4 MG tablet Take 1 tablet (4 mg total) by mouth every 8 (eight) hours as needed for nausea or vomiting. 20 tablet 3  . topiramate (TOPAMAX) 50 MG tablet Take 1 tablet (50 mg total) by mouth at bedtime. (Patient not taking: Reported on 11/26/2018) 30 tablet 3  . varenicline (CHANTIX STARTING MONTH PAK) 0.5 MG X 11 & 1 MG X 42 tablet Take one 0.5 mg tablet by mouth once daily for 3 days, then increase to one 0.5 mg tablet twice daily for 4 days, then increase to one 1 mg tablet twice daily. (Patient not taking: Reported on 03/23/2019) 53 tablet 0   No current facility-administered medications for this visit.    Review of Systems Review of Systems  Constitutional: Negative.   Respiratory: Negative.   Gastrointestinal: Negative.   Genitourinary: Positive for vaginal discharge. Negative for menstrual problem.    Blood  pressure 106/71, pulse 98, height 5\' 5"  (1.651 m), weight 180 lb 1.6 oz (81.7 kg).  Physical Exam Physical Exam Constitutional:      Appearance: She is normal weight. She is not ill-appearing.  Cardiovascular:     Rate and Rhythm: Normal rate.  Pulmonary:     Effort: Pulmonary effort is normal.  Abdominal:     Tenderness: There is no abdominal tenderness.  Genitourinary:    General: Normal vulva.     Vagina: Vaginal discharge (white) present.     Comments: cervix normal Neurological:     Mental Status: She is alert.     Data Reviewed Pap results  Assessment H/O BV recurrent F/u for LSIL pap  Plan If pap is negative then routine f/u 3-5 years Sent Clindamycin vag suppository Rx for recurrent sx     03/23/2019, 11:59 AM

## 2019-03-23 NOTE — Patient Instructions (Signed)

## 2019-03-24 LAB — CERVICOVAGINAL ANCILLARY ONLY
Bacterial Vaginitis (gardnerella): POSITIVE — AB
Candida Glabrata: NEGATIVE
Candida Vaginitis: POSITIVE — AB
Comment: NEGATIVE
Comment: NEGATIVE
Comment: NEGATIVE

## 2019-03-26 LAB — CYTOLOGY - PAP
Comment: NEGATIVE
Comment: NEGATIVE
HPV 16: NEGATIVE
HPV 18 / 45: NEGATIVE
High risk HPV: POSITIVE — AB

## 2019-03-30 ENCOUNTER — Ambulatory Visit: Payer: Medicaid Other | Admitting: Family Medicine

## 2019-04-01 NOTE — BH Specialist Note (Signed)
Integrated Behavioral Health via Telemedicine Phone Visit  04/01/2019 Suzanne Ellis [id number]  Number of [place] visits: 1 Session Start time: 9:54  Session End time: 10:20 Total time: 26  Referring Provider: Emeterio Reeve, MD Type of Visit: Phone Patient/Family location: Home [facility] Provider location: [facility] All persons participating in visit: Patient Suzanne Ellis and [place]    Confirmed patient's address: Yes  Confirmed patient's phone number: Yes  Any changes to demographics: No   Confirmed patient's insurance: Yes  Any changes to patient's insurance: No   Discussed confidentiality: Yes   I connected with Lesle Reek by a video enabled telemedicine application and verified that I am speaking with the correct person using two identifiers.     I discussed the limitations of evaluation and management by telemedicine and the availability of in person appointments.  I discussed that the purpose of this visit is to provide behavioral health care while limiting exposure to the novel coronavirus.   Discussed there is a possibility of technology failure and discussed alternative modes of communication if that failure occurs.  I discussed that engaging in this video visit, they consent to the provision of behavioral healthcare and the services will be billed under their insurance.  Patient and/or legal guardian expressed understanding and consented to video visit: Yes   PRESENTING CONCERNS: Patient and/or family reports the following symptoms/concerns: Pt states her primary symptoms are fatigue and invasive thoughts, followed by depression, sleep difficulty, low self esteem, lack of concentration, anxiety, worry, difficulty relaxing, irritability and dread, with increase in symptoms attributed to being unmedicated for one month. Pt has not received a call-back from [facility] for a follow-up appointment to refill her medication.  Duration of problem:  Ongoing, with increase while unmedicated in past month; Severity of problem: moderate  STRENGTHS (Protective Factors/Coping Skills): Good self advocacy; open to treatment; uses self-coping skills from previous therapy  GOALS ADDRESSED: Patient will: 1.  Reduce symptoms of: anxiety and insomnia  2.  Demonstrate ability to: Improve medication compliance  INTERVENTIONS: Interventions utilized:  Medication Monitoring and Psychoeducation and/or Health Education Standardized Assessments completed: Last PHQ9/GAD7 within past 2 weeks  ASSESSMENT: Patient currently experiencing Bipolar affective disorder, mixed, unspecified.   Patient may benefit from psychoeducation and brief therapeutic interventions regarding coping with symptoms of depression and anxiety .  PLAN: 1. Follow up with behavioral health clinician on : Two weeks 2. Behavioral recommendations:  -Take BH medication as prescribed -Continue using daily self-coping strategies that have helped in the past 3. Referral(s): [facility] (In Clinic) and Psychiatrist  I discussed the assessment and treatment plan with the patient and/or parent/guardian. They were provided an opportunity to ask questions and all were answered. They agreed with the plan and demonstrated an understanding of the instructions.   They were advised to call back or seek an in-person evaluation if the symptoms worsen or if the condition fails to improve as anticipated.  Caroleen Hamman [facility]  Depression screen [facility] 2/9 03/23/2019 12/09/2018 10/29/2018 09/12/2018 04/23/2018  Decreased Interest 1 0 0 0 1  Down, Depressed, Hopeless 2 0 0 0 1  PHQ - 2 Score 3 0 0 0 2  Altered sleeping 2 - - - -  Tired, decreased energy 2 - - - -  Change in appetite 0 - - - -  Feeling bad or failure about yourself  2 - - - -  Trouble concentrating 2 - - - -  Moving slowly or fidgety/restless  1 - - - -  Suicidal thoughts 0 - - - -  PHQ-9 Score 12 - - - -  Some  recent data might be hidden   GAD 7 : Generalized Anxiety Score 03/23/2019  Nervous, Anxious, on Edge 3  Control/stop worrying 2  Worry too much - different things 2  Trouble relaxing 2  Restless 1  Easily annoyed or irritable 2  Afraid - awful might happen 2  Total GAD 7 Score 14

## 2019-04-02 ENCOUNTER — Ambulatory Visit (INDEPENDENT_AMBULATORY_CARE_PROVIDER_SITE_OTHER): Payer: Medicaid Other | Admitting: Clinical

## 2019-04-02 ENCOUNTER — Telehealth: Payer: Medicaid Other | Admitting: Physician Assistant

## 2019-04-02 ENCOUNTER — Telehealth: Payer: Self-pay

## 2019-04-02 DIAGNOSIS — N898 Other specified noninflammatory disorders of vagina: Secondary | ICD-10-CM

## 2019-04-02 DIAGNOSIS — F3160 Bipolar disorder, current episode mixed, unspecified: Secondary | ICD-10-CM

## 2019-04-02 MED ORDER — FLUCONAZOLE 150 MG PO TABS: 150.0000 mg | ORAL_TABLET | Freq: Once | ORAL | 0 refills | Status: AC

## 2019-04-02 MED ORDER — METRONIDAZOLE 500 MG PO TABS: 500.0000 mg | ORAL_TABLET | Freq: Two times a day (BID) | ORAL | 0 refills | Status: AC

## 2019-04-02 NOTE — Progress Notes (Signed)
We are sorry that you are not feeling well. Here is how we plan to help! Based on what you shared with me it looks like you: may have vaginosis secondary to year and bacteria.   Vaginosis is an inflammation of the vagina that can result in discharge, itching and pain. The cause is usually a change in the normal balance of vaginal bacteria or an infection. Vaginosis can also result from reduced estrogen levels after menopause.  The most common causes of vaginosis are:   Bacterial vaginosis which results from an overgrowth of one on several organisms that are normally present in your vagina.   Yeast infections which are caused by a naturally occurring fungus called candida.   Vaginal atrophy (atrophic vaginosis) which results from the thinning of the vagina from reduced estrogen levels after menopause.   Trichomoniasis which is caused by a parasite and is commonly transmitted by sexual intercourse.  Factors that increase your risk of developing vaginosis include: Marland Kitchen Medications, such as antibiotics and steroids . Uncontrolled diabetes . Use of hygiene products such as bubble bath, vaginal spray or vaginal deodorant . Douching . Wearing damp or tight-fitting clothing . Using an intrauterine device (IUD) for birth control . Hormonal changes, such as those associated with pregnancy, birth control pills or menopause . Sexual activity . Having a sexually transmitted infection  Your treatment plan is Metronidazole or Flagyl 500mg  twice a day for 7 days.  I have electronically sent this prescription into the pharmacy that you have chosen. I have also prescribed a tablet of Diflucan.   Be sure to take all of the medication as directed. Stop taking any medication if you develop a rash, tongue swelling or shortness of breath. Mothers who are breast feeding should consider pumping and discarding their breast milk while on these antibiotics. However, there is no consensus that infant exposure at these  doses would be harmful.  Remember that medication creams can weaken latex condoms.   HOME CARE:  Good hygiene may prevent some types of vaginosis from recurring and may relieve some symptoms:  . Avoid baths, hot tubs and whirlpool spas. Rinse soap from your outer genital area after a shower, and dry the area well to prevent irritation. Don't use scented or harsh soaps, such as those with deodorant or antibacterial action. Marland Kitchen Avoid irritants. These include scented tampons and pads. . Wipe from front to back after using the toilet. Doing so avoids spreading fecal bacteria to your vagina.  Other things that may help prevent vaginosis include:  Marland Kitchen Don't douche. Your vagina doesn't require cleansing other than normal bathing. Repetitive douching disrupts the normal organisms that reside in the vagina and can actually increase your risk of vaginal infection. Douching won't clear up a vaginal infection. . Use a latex condom. Both female and female latex condoms may help you avoid infections spread by sexual contact. . Wear cotton underwear. Also wear pantyhose with a cotton crotch. If you feel comfortable without it, skip wearing underwear to bed. Yeast thrives in Marland Kitchen Your symptoms should improve in the next day or two.  GET HELP RIGHT AWAY IF:  . You have pain in your lower abdomen ( pelvic area or over your ovaries) . You develop nausea or vomiting . You develop a fever . Your discharge changes or worsens . You have persistent pain with intercourse . You develop shortness of breath, a rapid pulse, or you faint.  These symptoms could be signs of problems or infections  that need to be evaluated by a medical provider now.  MAKE SURE YOU    Understand these instructions.  Will watch your condition.  Will get help right away if you are not doing well or get worse.  Your e-visit answers were reviewed by a board certified advanced clinical practitioner to complete your  personal care plan. Depending upon the condition, your plan could have included both over the counter or prescription medications. Please review your pharmacy choice to make sure that you have choses a pharmacy that is open for you to pick up any needed prescription, Your safety is important to [place]. If you have drug allergies check your prescription carefully.   You can use MyChart to ask questions about today's visit, request a non-urgent call back, or ask for a work or school excuse for 24 hours related to this e-Visit. If it has been greater than 24 hours you will need to follow up with your provider, or enter a new e-Visit to address those concerns. You will get a MyChart message within the next two days asking about your experience. I hope that your e-visit has been valuable and will speed your recovery.  Approximately 5 minutes was spent documenting and reviewing patient's chart.

## 2019-04-02 NOTE — Telephone Encounter (Signed)
Pt of Dr. Debroah Loop left VM on nurse line requesting a call back to discuss symptoms that have not improved with prescription medication.

## 2019-04-03 NOTE — Telephone Encounter (Signed)
I called Suzanne Ellis and since she left her message she had an Evisit for same symptoms. They prescribed flagyl and diflucan. I advised to follow their recommendations; but I would recommend take the flagyl first and then the diflucan. I also advised her if her symptoms are not relieved; to let us know. She voices understanding. Legrand Como

## 2019-04-06 ENCOUNTER — Other Ambulatory Visit: Payer: Self-pay | Admitting: Obstetrics & Gynecology

## 2019-04-06 ENCOUNTER — Telehealth: Payer: Self-pay

## 2019-04-06 DIAGNOSIS — B373 Candidiasis of vulva and vagina: Secondary | ICD-10-CM

## 2019-04-06 DIAGNOSIS — B3731 Acute candidiasis of vulva and vagina: Secondary | ICD-10-CM

## 2019-04-06 MED ORDER — FLUCONAZOLE 150 MG PO TABS: 150.0000 mg | ORAL_TABLET | Freq: Once | ORAL | 0 refills | Status: AC

## 2019-04-06 NOTE — Telephone Encounter (Addendum)
-----   Message from Adam Phenix, MD sent at 04/06/2019  1:48 AM EST ----- LSIL pap needs colposcopy  Scheduled appt for 05/07/19 @ 0815.  Attempted to call pt x 3, the first two times the phone would pick up and then it would disconnect.  Called third time and LM that I apologize for the disconnection if she could please call the office for results and f/u.  Mychart message sent.  Addison Naegeli, RN 04/06/19

## 2019-04-06 NOTE — Progress Notes (Signed)
Diflucan rx for yeast

## 2019-04-13 ENCOUNTER — Other Ambulatory Visit: Payer: Self-pay

## 2019-04-13 ENCOUNTER — Ambulatory Visit (INDEPENDENT_AMBULATORY_CARE_PROVIDER_SITE_OTHER): Payer: Medicaid Other | Admitting: Family Medicine

## 2019-04-13 ENCOUNTER — Encounter: Payer: Self-pay | Admitting: Family Medicine

## 2019-04-13 VITALS — BP 112/60 | HR 105 | Ht 65.0 in | Wt 180.0 lb

## 2019-04-13 DIAGNOSIS — F3175 Bipolar disorder, in partial remission, most recent episode depressed: Secondary | ICD-10-CM | POA: Diagnosis not present

## 2019-04-13 MED ORDER — VENLAFAXINE HCL ER 37.5 MG PO CP24: 37.5000 mg | ORAL_CAPSULE | Freq: Every day | ORAL | 3 refills | Status: DC

## 2019-04-13 NOTE — Progress Notes (Signed)
Subjective: Chief Complaint  Patient presents with  . Medication Management     HPI: Suzanne Ellis is a 34 y.o. presenting to clinic today to discuss the following:  1 Bipolar Disorder Was getting Effexor through [facility], was advised that wouldn't get refill until she is seen there, but has been unable to get an appointment right away.  Has not been able to see a psychiatrist or a Veterinary surgeon.  Has been without Effexor since December, she is unsure if it was working or not.  Overall, she feels worse since being without Effexor.  States that she is incredibly fatigued and has noticed a worsening since being off of her medication in the last month.  She is asking if there is any way she can get a refill while she tries to get in to either [place] or another provider for her psychiatric care and counseling.    Office Visit from 04/13/2019 in [facility]  PHQ-9 Total Score  15      Health Maintenance: Due for flu shot, declines today     ROS noted in HPI. Chief complaint noted.  Other Pertinent PMH: Bipolar disorder, migraine with aura, Arnold-Chiari malformation type I, PTSD Past Medical, Surgical, Social, and Family History Reviewed & Updated per EMR.      Social History   Tobacco Use  Smoking Status Former Smoker  . Packs/day: 0.50  . Years: 10.00  . Pack years: 5.00  . Types: Cigarettes  Smokeless Tobacco Never Used   Smoking status noted.    Objective: BP 112/60   Pulse (!) 105   Ht 5\' 5"  (1.651 m)   Wt 180 lb (81.6 kg)   SpO2 97%   BMI 29.95 kg/m  Vitals and nursing notes reviewed  Physical Exam:  General: 34 y.o. female in NAD Lungs: Breathing comfortably on room air Skin: warm and dry Psych: Depressed mood, flat affect, intermittently closing eyes for long periods of time during her appointment in her children's appointment, denies SI   No results found for this or any previous visit (from the past 72 hour(s)).   Assessment/Plan:  Bipolar disorder Clearly not well controlled.  I do think it is best if patient follows with a psychiatrist and receives formal counseling, but understand that it is more dangerous for her at this time to be without her medication as she is not doing well.  No evidence of acute mania.  She is not a danger to herself or her children at this time.  Discussed with her that she should go home and call [name] immediately to get the first available appointment, also referred to chronic care management to look for assistance with possibly sending her to another location.  Patient given a list of psychiatrists and therapists in the area as well to call and shop around on her own.  I have refilled her Effexor for 3 months to help bridge the gap in case she cannot be seen in a timely manner.  Suicide precautions discussed with the patient, she voiced understanding.  Advised to come to ED immediately if she has suicidal ideation.     PATIENT EDUCATION PROVIDED: See AVS    Diagnosis and plan along with any newly prescribed medication(s) were discussed in detail with this patient today. The patient verbalized understanding and agreed with the plan. Patient advised if symptoms worsen return to clinic or ER.   Health Maintainance:   Orders Placed This Encounter  Procedures  .  Ambulatory referral to Chronic Care Management Services    Referral Priority:   Routine    Referral Type:   Consultation    Referral Reason:   Care Coordination    Number of Visits Requested:   1    Meds ordered this encounter  Medications  . venlafaxine XR (EFFEXOR-XR) 37.5 MG 24 hr capsule    Sig: Take 1 capsule (37.5 mg total) by mouth daily with breakfast.    Dispense:  30 capsule    Refill:  [place], DO 04/14/2019, 10:59 AM PGY-2 [place]

## 2019-04-13 NOTE — Patient Instructions (Signed)
Psychiatry Resource List (Adults and Children) Most of these providers will take Medicaid. please consult your insurance for a complete and updated list of available providers. When calling to make an appointment have your insurance information available to confirm you are covered.  [facility]:   [place]: [address]     [phone] [place]: [address],        [phone] [place]: [address],    [phone] [place]: [address],                   [phone]  [facility]  (Psychiatry & counseling ; adults & children ; will take Medicaid) [address], [place], [place]        [phone]   [facility]  (Psychiatry only; Adults only, will take Medicaid)  [address], [place], [place] [address]       [phone]   [facility] (Psychiatry & counseling ; adults & children ; will take Medicaid [address]  [place] [address]   [phone]    (Spanish therapist)  [facility]  Psychiatry & counseling; Adults and children;  [address]    [place], [place] [address]    [phone]  [facility] (Psychiatry & counseling; adults & children; Medicare no Medicaid)  [address]   [place], [place]  [address]      [phone]    [facility] (up to age 21)  Psychiatry & counseling ,will take Medicaid, must do counseling to receive psychiatry services  [address] [place] [address]        [phone]  [facility] (Psychiatry & counseling; adults & children; will take Medicaid) [address],  [place], [place]  [phone]  [name]---  Walk-in Mon-Fri, 8:30-5:00 (will take Medicaid)  [address], [place], [place]  [phone]    [facility] --- Walk-In Mon-Friday 8am-3pm ( will take Medicaid, Psychiatry, Adults & children,  [address], [place], [place]   [phone]   [facility]--, Walk-in M-F 8am-12pm and 1pm -3pm   (Counseling, Psychiatry, will take Medicaid, adults & children)  [address], , [place]  [phone]    

## 2019-04-14 ENCOUNTER — Ambulatory Visit: Payer: Medicaid Other | Admitting: Licensed Clinical Social Worker

## 2019-04-14 ENCOUNTER — Encounter: Payer: Self-pay | Admitting: Family Medicine

## 2019-04-14 DIAGNOSIS — F4310 Post-traumatic stress disorder, unspecified: Secondary | ICD-10-CM

## 2019-04-14 DIAGNOSIS — F418 Other specified anxiety disorders: Secondary | ICD-10-CM

## 2019-04-14 NOTE — BH Specialist Note (Signed)
Pt not seen today: Visit was scheduled to follow up regarding pt's BH medication, but this visit was unnecessary, as pt was seen by PCP on 1/28, and PCP refilled BH medication for 3 months. Pt will re-establish with [name] for ongoing BH medication management.

## 2019-04-14 NOTE — Assessment & Plan Note (Addendum)
Clearly not well controlled.  I do think it is best if patient follows with a psychiatrist and receives formal counseling, but understand that it is more dangerous for her at this time to be without her medication as she is not doing well.  No evidence of acute mania.  She is not a danger to herself or her children at this time.  Discussed with her that she should go home and call [name] immediately to get the first available appointment, also referred to chronic care management to look for assistance with possibly sending her to another location.  Patient given a list of psychiatrists and therapists in the area as well to call and shop around on her own.  I have refilled her Effexor for 3 months to help bridge the gap in case she cannot be seen in a timely manner.  Suicide precautions discussed with the patient, she voiced understanding.  Advised to come to ED immediately if she has suicidal ideation.

## 2019-04-14 NOTE — Patient Instructions (Signed)
Licensed Clinical Social Worker Visit Information Ms. Meckley  it was nice speaking with you. Please call me directly if you have questions [phone] Goals we discussed today:  Goals Addressed            This Visit's Progress   . Connect with Mental Health provider       Current Barriers:  . Patient with Anxiety, Depression, and Bipolar Disorder needs support and care coordination to reestablish care with [facility]. PCP provided patient with 3 months of medication until established with mental health provider.  . Patient seen by [facility] in the past however having difficulty reconnecting  Clinical Social Work Goal(s):  Marland Kitchen Over the next 10 days, Patient will contact [name] to set up appointment for ongoing medication needs.  Interventions:  . Assessed patient's understanding, education, previous treatment and care  needs  . Provide phone number to call [facility] and discussed other options . Other interventions include: Solution-Focused Strategies   Patient Self Care Activities & Deficits:  . Patient will call [name] [phone] to schedule an appointment  . Patient is motivated for treatment   Initial goal documentation        Materials provided:  Ms. Talbot was given information about Care Management services today including:  1. Care Management services include personalized support from designated clinical staff supervised by her physician, including individualized plan of care and coordination with other care providers 2. 24/7 contact [phone] for assistance for urgent and routine care needs. 3. Care Management services at any time by phone call to the office staff.  Patient agreed to services and verbal consent obtained.    The patient verbalized understanding of instructions provided today and declined a print copy of patient instruction materials.  Follow up plan:  F/U schedule with LCSW in two weeks Feb. 10th at 10:00  Soundra Pilon, LCSW

## 2019-04-14 NOTE — Chronic Care Management (AMB) (Signed)
Care Management  Social Work Note  04/14/2019 Name: Suzanne Ellis MRN: [id number] DOB: 10-Oct-1985  Suzanne Ellis is a 34 y.o. year old female who sees Suzanne Ellis, Suzanne Labs, MD for primary care. The CCM team was consulted for assistance with Mental Health Resources to reconnect with psychiatry for medication management .   SDOH (Social Determinants of Health) screening performed today: . See Care Plan for related entries.   Outpatient Encounter Medications as of 04/14/2019  Medication Sig  . baclofen (LIORESAL) 10 MG tablet Take 1 tablet (10 mg total) by mouth 3 (three) times daily. (Patient not taking: Reported on 11/26/2018)  . clindamycin (CLEOCIN) 100 MG vaginal suppository Place 1 suppository (100 mg total) vaginally at bedtime.  Marland Kitchen HYDROcodone-acetaminophen (NORCO) 5-325 MG tablet Take 1-2 tablets by mouth every 6 (six) hours as needed.  . hydrOXYzine (ATARAX/VISTARIL) 10 MG tablet   . ibuprofen (ADVIL,MOTRIN) 200 MG tablet Take 400 mg by mouth every 6 (six) hours as needed for moderate pain.   . medroxyPROGESTERone (DEPO-PROVERA) 150 MG/ML injection Inject 150 mg into the muscle every 3 (three) months.  . Multiple Vitamin (MULTIVITAMIN) tablet Take 1 tablet by mouth daily.  . naproxen (NAPROSYN) 500 MG tablet Take 1 tablet (500 mg total) by mouth 2 (two) times daily with a meal. (Patient not taking: Reported on 11/26/2018)  . naproxen (NAPROSYN) 500 MG tablet Take 1 tablet (500 mg total) by mouth 2 (two) times daily with a meal. (Patient not taking: Reported on 11/26/2018)  . ondansetron (ZOFRAN) 4 MG tablet Take 1 tablet (4 mg total) by mouth every 8 (eight) hours as needed for nausea or vomiting.  . SUMAtriptan (IMITREX) 100 MG tablet Take 1 tablet earliest onset of migraine.  May repeat in 2 hours if headache persists or recurs.  Maximum 2 tablets in 24 hours  . topiramate (TOPAMAX) 50 MG tablet Take 1 tablet (50 mg total) by mouth at bedtime. (Patient not taking: Reported on 11/26/2018)  .  varenicline (CHANTIX STARTING MONTH PAK) 0.5 MG X 11 & 1 MG X 42 tablet Take one 0.5 mg tablet by mouth once daily for 3 days, then increase to one 0.5 mg tablet twice daily for 4 days, then increase to one 1 mg tablet twice daily. (Patient not taking: Reported on 03/23/2019)  . venlafaxine XR (EFFEXOR-XR) 37.5 MG 24 hr capsule Take 1 capsule (37.5 mg total) by mouth daily with breakfast.   No facility-administered encounter medications on file as of 04/14/2019.   Goals Addressed            This Visit's Progress   . Connect with Mental Health provider       Current Barriers:  . Patient with Anxiety, Depression, and Bipolar Disorder needs support and care coordination to reestablish care with Suzanne Ellis. PCP provided patient with 3 months of medication until established with mental health provider.  . Patient seen by Suzanne Ellis in the past however having difficulty reconnecting  Clinical Social Work Goal(s):  Marland Kitchen Over the next 10 days, Patient will contact Suzanne Ellis to set up appointment for ongoing medication needs.  Interventions:  . Assessed patient's understanding, education, previous treatment and care  needs  . Provide phone number to call Suzanne Ellis and discussed other options . Other interventions include: Solution-Focused Strategies   Patient Self Care Activities & Deficits:  . Patient will call Suzanne Ellis to schedule an appointment  . Patient is motivated for treatment   Initial goal documentation  Plan:  1. Patient will call Suzanne Ellis to schedule an appointment  2. LCSW will F/U with patient in two weeks to see if she was able to re-establish with Suzanne Ellis, Suzanne Ellis   [phone] 4:11 PM

## 2019-04-16 ENCOUNTER — Ambulatory Visit: Payer: Medicaid Other | Admitting: Clinical

## 2019-04-17 ENCOUNTER — Other Ambulatory Visit: Payer: Self-pay | Admitting: Obstetrics & Gynecology

## 2019-04-17 NOTE — Telephone Encounter (Signed)
Would anyone be able to assist Suzanne Ellis with this?  I am not sure how to help her set up MyChart.  Thanks.

## 2019-04-20 ENCOUNTER — Telehealth: Payer: Medicaid Other | Admitting: Physician Assistant

## 2019-04-20 DIAGNOSIS — N898 Other specified noninflammatory disorders of vagina: Secondary | ICD-10-CM | POA: Diagnosis not present

## 2019-04-20 MED ORDER — FLUCONAZOLE 150 MG PO TABS: 150.0000 mg | ORAL_TABLET | Freq: Once | ORAL | 0 refills | Status: AC

## 2019-04-20 MED ORDER — METRONIDAZOLE 500 MG PO TABS: 500.0000 mg | ORAL_TABLET | Freq: Two times a day (BID) | ORAL | 0 refills | Status: AC

## 2019-04-20 NOTE — Progress Notes (Signed)
We are sorry that you are not feeling well. Here is how we plan to help! Based on what you shared with me it looks like you: May have a vaginosis due to bacteria  Vaginosis is an inflammation of the vagina that can result in discharge, itching and pain. The cause is usually a change in the normal balance of vaginal bacteria or an infection. Vaginosis can also result from reduced estrogen levels after menopause.  The most common causes of vaginosis are:   Bacterial vaginosis which results from an overgrowth of one on several organisms that are normally present in your vagina.   Yeast infections which are caused by a naturally occurring fungus called candida.   Vaginal atrophy (atrophic vaginosis) which results from the thinning of the vagina from reduced estrogen levels after menopause.   Trichomoniasis which is caused by a parasite and is commonly transmitted by sexual intercourse.  Factors that increase your risk of developing vaginosis include: Marland Kitchen Medications, such as antibiotics and steroids . Uncontrolled diabetes . Use of hygiene products such as bubble bath, vaginal spray or vaginal deodorant . Douching . Wearing damp or tight-fitting clothing . Using an intrauterine device (IUD) for birth control . Hormonal changes, such as those associated with pregnancy, birth control pills or menopause . Sexual activity . Having a sexually transmitted infection  Your treatment plan is Metronidazole or Flagyl 500mg  twice a day for 7 days.  I have electronically sent this prescription into the pharmacy that you have chosen. I have also send a prescription for a 150 mg tablet of Diflucan if you experience symptoms of a yeast infection after taking the antibiotic.   Be sure to take all of the medication as directed. Stop taking any medication if you develop a rash, tongue swelling or shortness of breath. Mothers who are breast feeding should consider pumping and discarding their breast milk while on  these antibiotics. However, there is no consensus that infant exposure at these doses would be harmful.  Remember that medication creams can weaken latex condoms. Marland Kitchen   HOME CARE:  Good hygiene may prevent some types of vaginosis from recurring and may relieve some symptoms:  . Avoid baths, hot tubs and whirlpool spas. Rinse soap from your outer genital area after a shower, and dry the area well to prevent irritation. Don't use scented or harsh soaps, such as those with deodorant or antibacterial action. Marland Kitchen Avoid irritants. These include scented tampons and pads. . Wipe from front to back after using the toilet. Doing so avoids spreading fecal bacteria to your vagina.  Other things that may help prevent vaginosis include:  Marland Kitchen Don't douche. Your vagina doesn't require cleansing other than normal bathing. Repetitive douching disrupts the normal organisms that reside in the vagina and can actually increase your risk of vaginal infection. Douching won't clear up a vaginal infection. . Use a latex condom. Both female and female latex condoms may help you avoid infections spread by sexual contact. . Wear cotton underwear. Also wear pantyhose with a cotton crotch. If you feel comfortable without it, skip wearing underwear to bed. Yeast thrives in [employer] Your symptoms should improve in the next day or two.  GET HELP RIGHT AWAY IF:  . You have pain in your lower abdomen ( pelvic area or over your ovaries) . You develop nausea or vomiting . You develop a fever . Your discharge changes or worsens . You have persistent pain with intercourse . You develop shortness of breath, a  rapid pulse, or you faint.  These symptoms could be signs of problems or infections that need to be evaluated by a medical provider now.  MAKE SURE YOU    Understand these instructions.  Will watch your condition.  Will get help right away if you are not doing well or get worse.  Your e-visit answers were  reviewed by a board certified advanced clinical practitioner to complete your personal care plan. Depending upon the condition, your plan could have included both over the counter or prescription medications. Please review your pharmacy choice to make sure that you have choses a pharmacy that is open for you to pick up any needed prescription, Your safety is important to [place]. If you have drug allergies check your prescription carefully.   You can use MyChart to ask questions about today's visit, request a non-urgent call back, or ask for a work or school excuse for 24 hours related to this e-Visit. If it has been greater than 24 hours you will need to follow up with your provider, or enter a new e-Visit to address those concerns. You will get a MyChart message within the next two days asking about your experience. I hope that your e-visit has been valuable and will speed your recovery.  Approximately 5 minutes was spent documenting and reviewing patient's chart.

## 2019-04-21 ENCOUNTER — Other Ambulatory Visit: Payer: Self-pay

## 2019-04-21 ENCOUNTER — Ambulatory Visit (INDEPENDENT_AMBULATORY_CARE_PROVIDER_SITE_OTHER): Payer: Medicaid Other

## 2019-04-21 DIAGNOSIS — Z3042 Encounter for surveillance of injectable contraceptive: Secondary | ICD-10-CM

## 2019-04-21 MED ORDER — MEDROXYPROGESTERONE ACETATE 150 MG/ML IM SUSP
150.0000 mg | Freq: Once | INTRAMUSCULAR | Status: AC
  Administered 2019-04-21: 11:00:00 150 mg via INTRAMUSCULAR

## 2019-04-21 NOTE — Progress Notes (Signed)
Patient here today for Depo Provera injection and is within her dates.    Depo given in [facility] today.  Site unremarkable & patient tolerated injection.   Pt scheduled follow-up appointment with Dr. Frances Furbish for contraception management. She will decide at that point if she wants to continue depo provera injections.   Veronda Prude, RN

## 2019-04-28 ENCOUNTER — Ambulatory Visit: Payer: Medicaid Other | Admitting: Family Medicine

## 2019-04-29 ENCOUNTER — Other Ambulatory Visit: Payer: Self-pay

## 2019-04-29 ENCOUNTER — Telehealth: Payer: Medicaid Other

## 2019-04-29 ENCOUNTER — Ambulatory Visit: Payer: Self-pay | Admitting: Licensed Clinical Social Worker

## 2019-04-29 NOTE — Chronic Care Management (AMB) (Signed)
  Social Work Care Management  Unsuccessful Phone Outreach   04/29/2019 Name: JOVON STREETMAN MRN: [id number] DOB: 10/17/1985  Referred by: Lennox Solders, MD,  Reason for referral : Care Coordination (to connect to mental health provider)   SHANLEY FURLOUGH is a 34 y.o. year old female who sees [name], [name], MD for primary care.    Follow up called to patient to assess needs and barriers reference reconnecting to [facility] for medication management and mental health needs. Telephone outreach was unsuccessful. A HIPPA compliant phone message was left for the patient providing contact information and requesting a return call.  Plan:LCSW will wait for return call.   Sammuel Hines, LCSW Clinical Social Worker [facility]   [phone] 10:10 AM

## 2019-05-07 ENCOUNTER — Ambulatory Visit: Payer: Medicaid Other | Admitting: Obstetrics & Gynecology

## 2019-05-18 ENCOUNTER — Ambulatory Visit: Payer: Medicaid Other | Admitting: Obstetrics & Gynecology

## 2019-05-18 ENCOUNTER — Encounter: Payer: Self-pay | Admitting: Obstetrics & Gynecology

## 2019-06-15 ENCOUNTER — Telehealth: Payer: Medicaid Other | Admitting: Emergency Medicine

## 2019-06-15 DIAGNOSIS — N760 Acute vaginitis: Secondary | ICD-10-CM

## 2019-06-15 MED ORDER — METRONIDAZOLE 500 MG PO TABS: 500.0000 mg | ORAL_TABLET | Freq: Two times a day (BID) | ORAL | 0 refills | Status: DC

## 2019-06-15 MED ORDER — FLUCONAZOLE 150 MG PO TABS: 150.0000 mg | ORAL_TABLET | Freq: Once | ORAL | 0 refills | Status: DC

## 2019-06-15 NOTE — Progress Notes (Signed)
We are sorry that you are not feeling well. Here is how we plan to help! Based on what you shared with me it looks like you: May have a vaginosis due to bacteria or fungus  Vaginosis is an inflammation of the vagina that can result in discharge, itching and pain. The cause is usually a change in the normal balance of vaginal bacteria or an infection. Vaginosis can also result from reduced estrogen levels after menopause.  The most common causes of vaginosis are:   Bacterial vaginosis which results from an overgrowth of one on several organisms that are normally present in your vagina.   Yeast infections which are caused by a naturally occurring fungus called candida.   Vaginal atrophy (atrophic vaginosis) which results from the thinning of the vagina from reduced estrogen levels after menopause.   Trichomoniasis which is caused by a parasite and is commonly transmitted by sexual intercourse.  Factors that increase your risk of developing vaginosis include: Marland Kitchen Medications, such as antibiotics and steroids . Uncontrolled diabetes . Use of hygiene products such as bubble bath, vaginal spray or vaginal deodorant . Douching . Wearing damp or tight-fitting clothing . Using an intrauterine device (IUD) for birth control . Hormonal changes, such as those associated with pregnancy, birth control pills or menopause . Sexual activity . Having a sexually transmitted infection  Your treatment plan is metronidazole 50 mg twice a day for 7 days and diflucan 150 mg by mouth once. Be sure to take all of the medication as directed. Stop taking any medication if you develop a rash, tongue swelling or shortness of breath. Mothers who are breast feeding should consider pumping and discarding their breast milk while on these antibiotics. However, there is no consensus that infant exposure at these doses would be harmful.  Remember that medication creams can weaken latex condoms. Marland Kitchen   HOME CARE:  Good  hygiene may prevent some types of vaginosis from recurring and may relieve some symptoms:  . Avoid baths, hot tubs and whirlpool spas. Rinse soap from your outer genital area after a shower, and dry the area well to prevent irritation. Don't use scented or harsh soaps, such as those with deodorant or antibacterial action. Marland Kitchen Avoid irritants. These include scented tampons and pads. . Wipe from front to back after using the toilet. Doing so avoids spreading fecal bacteria to your vagina.  Other things that may help prevent vaginosis include:  Marland Kitchen Don't douche. Your vagina doesn't require cleansing other than normal bathing. Repetitive douching disrupts the normal organisms that reside in the vagina and can actually increase your risk of vaginal infection. Douching won't clear up a vaginal infection. . Use a latex condom. Both female and female latex condoms may help you avoid infections spread by sexual contact. . Wear cotton underwear. Also wear pantyhose with a cotton crotch. If you feel comfortable without it, skip wearing underwear to bed. Yeast thrives in [employer] Your symptoms should improve in the next day or two.  GET HELP RIGHT AWAY IF:  . You have pain in your lower abdomen ( pelvic area or over your ovaries) . You develop nausea or vomiting . You develop a fever . Your discharge changes or worsens . You have persistent pain with intercourse . You develop shortness of breath, a rapid pulse, or you faint.  These symptoms could be signs of problems or infections that need to be evaluated by a medical provider now.  MAKE SURE YOU    Understand  these instructions.  Will watch your condition.  Will get help right away if you are not doing well or get worse.  Your e-visit answers were reviewed by a board certified advanced clinical practitioner to complete your personal care plan. Depending upon the condition, your plan could have included both over the counter or prescription  medications. Please review your pharmacy choice to make sure that you have choses a pharmacy that is open for you to pick up any needed prescription, Your safety is important to [place]. If you have drug allergies check your prescription carefully.   You can use MyChart to ask questions about today's visit, request a non-urgent call back, or ask for a work or school excuse for 24 hours related to this e-Visit. If it has been greater than 24 hours you will need to follow up with your provider, or enter a new e-Visit to address those concerns. You will get a MyChart message within the next two days asking about your experience. I hope that your e-visit has been valuable and will speed your recovery.  Greater than 5 but less than 10 minutes spent researching, coordinating, and implementing care for this patient today

## 2019-06-17 MED ORDER — FLUCONAZOLE 150 MG PO TABS: 150.0000 mg | ORAL_TABLET | Freq: Once | ORAL | 0 refills | Status: AC

## 2019-06-17 MED ORDER — METRONIDAZOLE 500 MG PO TABS: 500.0000 mg | ORAL_TABLET | Freq: Two times a day (BID) | ORAL | 0 refills | Status: DC

## 2019-06-17 NOTE — Addendum Note (Signed)
Addended by: Jannifer Rodney A on: 06/17/2019 06:36 PM   Modules accepted: Orders

## 2019-06-24 ENCOUNTER — Other Ambulatory Visit: Payer: Medicaid Other

## 2019-06-24 ENCOUNTER — Ambulatory Visit: Payer: Medicaid Other | Attending: Internal Medicine

## 2019-06-24 DIAGNOSIS — Z20822 Contact with and (suspected) exposure to covid-19: Secondary | ICD-10-CM

## 2019-06-25 LAB — NOVEL CORONAVIRUS, NAA: SARS-CoV-2, NAA: NOT DETECTED

## 2019-06-25 LAB — SARS-COV-2, NAA 2 DAY TAT

## 2019-06-26 ENCOUNTER — Encounter: Payer: Self-pay | Admitting: Family Medicine

## 2019-06-26 ENCOUNTER — Other Ambulatory Visit: Payer: Self-pay | Admitting: Family

## 2019-06-26 ENCOUNTER — Telehealth: Payer: Self-pay | Admitting: *Deleted

## 2019-06-26 NOTE — Telephone Encounter (Signed)
Pt needs a letter for work.  She started to feel bad last Friday 06/19/19, was sent home from work on Monday (06/22/19) and tested for covid on 06/24/19.  She was informed today that she was negative.  She needs a letter excusing her from work 06/22/19 - 06/26/19 and allow her to go back on Monday.  To PCP.  Pt has mychart and will print letter from that. Jone Baseman, CMA

## 2019-06-29 ENCOUNTER — Telehealth: Payer: Medicaid Other | Admitting: Physician Assistant

## 2019-06-29 DIAGNOSIS — J069 Acute upper respiratory infection, unspecified: Secondary | ICD-10-CM | POA: Diagnosis not present

## 2019-06-29 NOTE — Progress Notes (Signed)
We are sorry you are not feeling well.  Here is how we plan to help!  Unfortunately, I cannot write a work note for days that you have already missed but I can write you a work note until 4.12.2021.  Based on what you have shared with me, it looks like you may have a viral upper respiratory infection.  Upper respiratory infections are caused by a large number of viruses; however, rhinovirus is the most common cause.   Symptoms vary from person to person, with common symptoms including sore throat, cough, fatigue or lack of energy and feeling of general discomfort.  A low-grade fever of up to 100.4 may present, but is often uncommon.  Symptoms vary however, and are closely related to a person's age or underlying illnesses.  The most common symptoms associated with an upper respiratory infection are nasal discharge or congestion, cough, sneezing, headache and pressure in the ears and face.  These symptoms usually persist for about 3 to 10 days, but can last up to 2 weeks.  It is important to know that upper respiratory infections do not cause serious illness or complications in most cases.    Upper respiratory infections can be transmitted from person to person, with the most common method of transmission being a person's hands.  The virus is able to live on the skin and can infect other persons for up to 2 hours after direct contact.  Also, these can be transmitted when someone coughs or sneezes; thus, it is important to cover the mouth to reduce this risk.  To keep the spread of the illness at bay, good hand hygiene is very important.  This is an infection that is most likely caused by a virus. There are no specific treatments other than to help you with the symptoms until the infection runs its course.  We are sorry you are not feeling well.  Here is how we plan to help!   For nasal congestion, you may use an oral decongestants such as Mucinex D or if you have glaucoma or high blood pressure use plain  Mucinex.  Saline nasal spray or nasal drops can help and can safely be used as often as needed for congestion.    If you do not have a history of heart disease, hypertension, diabetes or thyroid disease, prostate/bladder issues or glaucoma, you may also use Sudafed to treat nasal congestion.  It is highly recommended that you consult with a pharmacist or your primary care physician to ensure this medication is safe for you to take.     If you have a cough, you may use cough suppressants such as Delsym and Robitussin.  If you have glaucoma or high blood pressure, you can also use Coricidin HBP.    If you have a sore or scratchy throat, use a saltwater gargle-  to  teaspoon of salt dissolved in a 4-ounce to 8-ounce glass of warm water.  Gargle the solution for approximately 15-30 seconds and then spit.  It is important not to swallow the solution.  You can also use throat lozenges/cough drops and Chloraseptic spray to help with throat pain or discomfort.  Warm or cold liquids can also be helpful in relieving throat pain.  For headache, pain or general discomfort, you can use Ibuprofen or Tylenol as directed.   Some authorities believe that zinc sprays or the use of Echinacea may shorten the course of your symptoms.   HOME CARE . Only take medications as instructed by  your medical team. . Be sure to drink plenty of fluids. Water is fine as well as fruit juices, sodas and electrolyte beverages. You may want to stay away from caffeine or alcohol. If you are nauseated, try taking small sips of liquids. How do you know if you are getting enough fluid? Your urine should be a pale yellow or almost colorless. . Get rest. . Taking a steamy shower or using a humidifier may help nasal congestion and ease sore throat pain. You can place a towel over your head and breathe in the steam from hot water coming from a faucet. . Using a saline nasal spray works much the same way. . Cough drops, hard candies and sore  throat lozenges may ease your cough. . Avoid close contacts especially the very young and the elderly . Cover your mouth if you cough or sneeze . Always remember to wash your hands.   GET HELP RIGHT AWAY IF: . You develop worsening fever. . If your symptoms do not improve within 10 days . You develop yellow or green discharge from your nose over 3 days. . You have coughing fits . You develop a severe head ache or visual changes. . You develop shortness of breath, difficulty breathing or start having chest pain . Your symptoms persist after you have completed your treatment plan  MAKE SURE YOU   Understand these instructions.  Will watch your condition.  Will get help right away if you are not doing well or get worse.  Your e-visit answers were reviewed by a board certified advanced clinical practitioner to complete your personal care plan. Depending upon the condition, your plan could have included both over the counter or prescription medications. Please review your pharmacy choice. If there is a problem, you may call our nursing hot line at and have the prescription routed to another pharmacy. Your safety is important to [place]. If you have drug allergies check your prescription carefully.   You can use MyChart to ask questions about today's visit, request a non-urgent call back, or ask for a work or school excuse for 24 hours related to this e-Visit. If it has been greater than 24 hours you will need to follow up with your provider, or enter a new e-Visit to address those concerns. You will get an e-mail in the next two days asking about your experience.  I hope that your e-visit has been valuable and will speed your recovery. Thank you for using e-visits.  Greater than 5 minutes, yet less than 10 minutes of time have been spend researching, coordinating, and implementing care for this patient today.

## 2019-06-29 NOTE — Telephone Encounter (Signed)
Letter created by PCP. Jone Baseman, CMA

## 2019-07-02 ENCOUNTER — Telehealth: Payer: Self-pay | Admitting: Family Medicine

## 2019-07-02 DIAGNOSIS — Z01818 Encounter for other preprocedural examination: Secondary | ICD-10-CM | POA: Diagnosis not present

## 2019-07-02 NOTE — Telephone Encounter (Signed)
Clinical info completed on Medical Clearance form.  Place form in Dr. Starr Sinclair box for completion.  Gildardo Tickner Zimmerman Rumple, CMA

## 2019-07-02 NOTE — Telephone Encounter (Signed)
**  URGENT** Pt is bringing in papers for Medical Clearance for Surgery and Anesthesia. She is needing them filled out and fax over today at [phone].

## 2019-07-03 ENCOUNTER — Telehealth: Payer: Medicaid Other | Admitting: Nurse Practitioner

## 2019-07-03 DIAGNOSIS — R112 Nausea with vomiting, unspecified: Secondary | ICD-10-CM

## 2019-07-03 NOTE — Progress Notes (Signed)
We are sorry that you are not feeling well. Here is how we plan to help!  Based on what you have shared with me it looks like you have a Virus that is irritating your GI tract.  Vomiting is the forceful emptying of a portion of the stomach's content through the mouth.  Although nausea and vomiting can make you feel miserable, it's important to remember that these are not diseases, but rather symptoms of an underlying illness.  When we treat short term symptoms, we always caution that any symptoms that persist should be fully evaluated in a medical office.  HOME CARE:  Drink clear liquids.  This is very important! Dehydration (the lack of fluid) can lead to a serious complication.  Start off with 1 tablespoon every 5 minutes for 8 hours.  You may begin eating bland foods after 8 hours without vomiting.  Start with saltine crackers, white bread, rice, mashed potatoes, applesauce.  After 48 hours on a bland diet, you may resume a normal diet.  Try to go to sleep.  Sleep often empties the stomach and relieves the need to vomit.  GET HELP RIGHT AWAY IF:   Your symptoms do not improve or worsen within 2 days after treatment.  You have a fever for over 3 days.  You cannot keep down fluids after trying the medication.  MAKE SURE YOU:   Understand these instructions.  Will watch your condition.  Will get help right away if you are not doing well or get worse.   Thank you for choosing an e-visit. Your e-visit answers were reviewed by a board certified advanced clinical practitioner to complete your personal care plan. Depending upon the condition, your plan could have included both over the counter or prescription medications. Please review your pharmacy choice. Be sure that the pharmacy you have chosen is open so that you can pick up your prescription now.  If there is a problem you may message your provider in MyChart to have the prescription routed to another pharmacy. Your safety is  important to [place]. If you have drug allergies check your prescription carefully.  For the next 24 hours, you can use MyChart to ask questions about today's visit, request a non-urgent call back, or ask for a work or school excuse from your e-visit provider. You will get an e-mail in the next two days asking about your experience. I hope that your e-visit has been valuable and will speed your recovery.  I have spent at least 5 minutes reviewing and documenting in the patient's chart.

## 2019-07-06 NOTE — Telephone Encounter (Signed)
Patient calls Friday checking on forms. PCP was out to study for boards and I was unable to have a preceptor sign, as it was a 2 page detailed history of patient.

## 2019-07-07 NOTE — Telephone Encounter (Signed)
I left a voice message on Suzanne Ellis's phone number saying that I had completed the first form, but it appears that the second form is asking for an examination for the purpose of clearance for surgery.  Since I have not seen her in quite some time, and her last visit with with a different provider at this clinic was not about surgical clearance, she will either need to make an appointment for me to fill this form out, or she can ask her surgery office if a physician there can complete this appointment.  I encouraged her to let us know what she would like to do and to make an appointment with me if necessary.

## 2019-07-08 NOTE — Telephone Encounter (Signed)
Form is in nurse room in purple folder. If she calls back, patient needs an apt for the remainder of the form to be completed.

## 2019-07-09 ENCOUNTER — Telehealth: Payer: Medicaid Other | Admitting: Physician Assistant

## 2019-07-09 DIAGNOSIS — N760 Acute vaginitis: Secondary | ICD-10-CM | POA: Diagnosis not present

## 2019-07-09 MED ORDER — FLUCONAZOLE 150 MG PO TABS: 150.0000 mg | ORAL_TABLET | Freq: Once | ORAL | 0 refills | Status: AC

## 2019-07-09 MED ORDER — METRONIDAZOLE 500 MG PO TABS: 500.0000 mg | ORAL_TABLET | Freq: Two times a day (BID) | ORAL | 0 refills | Status: DC

## 2019-07-09 NOTE — Progress Notes (Signed)
We are sorry that you are not feeling well.   It looks like you have symptoms of vaginosis on a monthly basis. I see where you have been treated for possible BV in Jan, Feb, March and now April.  There are many possible reasons as to why this is happening: this may be an non-resolving infection, this could be misdiagnosed and we are treating the incorrect problem, or you have recurrent infections.  Either way, you need to be seen in person by your GYN or PCP for a physical evaluation and lab work.    I will treat you today, but please schedule an appointment to follow-up in person for this problem.  This is perfect timing, as Dr. Roselie Awkward would like to follow-up with you for a colposcopy. Please contact their office.   Thank you for your understanding.   Also, read below to make sure you are performing proper self-care.  Based on what you shared with me it looks like you: May have a vaginosis due to bacteria  Vaginosis is an inflammation of the vagina that can result in discharge, itching and pain. The cause is usually a change in the normal balance of vaginal bacteria or an infection. Vaginosis can also result from reduced estrogen levels after menopause.  The most common causes of vaginosis are:   Bacterial vaginosis which results from an overgrowth of one on several organisms that are normally present in your vagina.   Yeast infections which are caused by a naturally occurring fungus called candida.   Vaginal atrophy (atrophic vaginosis) which results from the thinning of the vagina from reduced estrogen levels after menopause.   Trichomoniasis which is caused by a parasite and is commonly transmitted by sexual intercourse.  Factors that increase your risk of developing vaginosis include: Marland Kitchen Medications, such as antibiotics and steroids . Uncontrolled diabetes . Use of hygiene products such as bubble bath, vaginal spray or vaginal deodorant . Douching . Wearing damp or tight-fitting  clothing . Using an intrauterine device (IUD) for birth control . Hormonal changes, such as those associated with pregnancy, birth control pills or menopause . Sexual activity . Having a sexually transmitted infection  Your treatment plan is Metronidazole or Flagyl 500mg  twice a day for 7 days.  I have electronically sent this prescription into the pharmacy that you have chosen.  I have also prescribed Diflucan for yeast vaginitis.   Be sure to take all of the medication as directed. Stop taking any medication if you develop a rash, tongue swelling or shortness of breath. Mothers who are breast feeding should consider pumping and discarding their breast milk while on these antibiotics. However, there is no consensus that infant exposure at these doses would be harmful.  Remember that medication creams can weaken latex condoms. Marland Kitchen   HOME CARE:  Good hygiene may prevent some types of vaginosis from recurring and may relieve some symptoms:  . Avoid baths, hot tubs and whirlpool spas. Rinse soap from your outer genital area after a shower, and dry the area well to prevent irritation. Don't use scented or harsh soaps, such as those with deodorant or antibacterial action. Marland Kitchen Avoid irritants. These include scented tampons and pads. . Wipe from front to back after using the toilet. Doing so avoids spreading fecal bacteria to your vagina.  Other things that may help prevent vaginosis include:  Marland Kitchen Don't douche. Your vagina doesn't require cleansing other than normal bathing. Repetitive douching disrupts the normal organisms that reside in the vagina  and can actually increase your risk of vaginal infection. Douching won't clear up a vaginal infection. . Use a latex condom. Both female and female latex condoms may help you avoid infections spread by sexual contact. . Wear cotton underwear. Also wear pantyhose with a cotton crotch. If you feel comfortable without it, skip wearing underwear to bed. Yeast  thrives in [employer] Your symptoms should improve in the next day or two.  GET HELP RIGHT AWAY IF:  . You have pain in your lower abdomen ( pelvic area or over your ovaries) . You develop nausea or vomiting . You develop a fever . Your discharge changes or worsens . You have persistent pain with intercourse . You develop shortness of breath, a rapid pulse, or you faint.  These symptoms could be signs of problems or infections that need to be evaluated by a medical provider now.  MAKE SURE YOU    Understand these instructions.  Will watch your condition.  Will get help right away if you are not doing well or get worse.  Your e-visit answers were reviewed by a board certified advanced clinical practitioner to complete your personal care plan. Depending upon the condition, your plan could have included both over the counter or prescription medications. Please review your pharmacy choice to make sure that you have choses a pharmacy that is open for you to pick up any needed prescription, Your safety is important to [place]. If you have drug allergies check your prescription carefully.   You can use MyChart to ask questions about today's visit, request a non-urgent call back, or ask for a work or school excuse for 24 hours related to this e-Visit. If it has been greater than 24 hours you will need to follow up with your provider, or enter a new e-Visit to address those concerns. You will get a MyChart message within the next two days asking about your experience. I hope that your e-visit has been valuable and will speed your recovery.  Greater than 5 minutes, yet less than 10 minutes of time have been spent researching, coordinating and implementing care for this patient today.

## 2019-07-14 ENCOUNTER — Ambulatory Visit (INDEPENDENT_AMBULATORY_CARE_PROVIDER_SITE_OTHER): Payer: Medicaid Other | Admitting: Family Medicine

## 2019-07-14 ENCOUNTER — Other Ambulatory Visit: Payer: Self-pay

## 2019-07-14 ENCOUNTER — Encounter: Payer: Self-pay | Admitting: Family Medicine

## 2019-07-14 DIAGNOSIS — Z01818 Encounter for other preprocedural examination: Secondary | ICD-10-CM | POA: Diagnosis not present

## 2019-07-14 DIAGNOSIS — R87612 Low grade squamous intraepithelial lesion on cytologic smear of cervix (LGSIL): Secondary | ICD-10-CM | POA: Diagnosis not present

## 2019-07-14 DIAGNOSIS — F17200 Nicotine dependence, unspecified, uncomplicated: Secondary | ICD-10-CM

## 2019-07-14 DIAGNOSIS — F418 Other specified anxiety disorders: Secondary | ICD-10-CM | POA: Diagnosis not present

## 2019-07-14 NOTE — Assessment & Plan Note (Signed)
Stopped smoking one year ago

## 2019-07-14 NOTE — Assessment & Plan Note (Signed)
No increased risk or any modifiable risk factors Ok for surgery

## 2019-07-14 NOTE — Progress Notes (Signed)
    SUBJECTIVE:   CHIEF COMPLAINT / HPI:   PREOP EVALUATION Having liposuction under general anesthesia.   See forms  No history of cardiac or pulmonary disease No fhx of anesthesia problems Had arm surgery with anesthesia no problem Stopped smoking one year ago   PERTINENT  PMH / PSH: Bipolar and depression - currently stable   OBJECTIVE:   BP 110/62   Pulse 87   Wt 179 lb 3.2 oz (81.3 kg)   SpO2 99%   BMI 29.82 kg/m   Heart - Regular rate and rhythm.  No murmurs, gallops or rubs.    Lungs:  Normal respiratory effort, chest expands symmetrically. Lungs are clear to auscultation, no crackles or wheezes. Neck:  No deformities, thyromegaly, masses, or tenderness noted.   Supple with full range of motion without pain. Abdomen: soft and non-tender without masses, organomegaly or hernias noted.  No guarding or rebound Extremities:  No cyanosis, edema, or deformity noted with good range of motion of all major joints.   Psych:  Cognition and judgment appear intact. Alert, communicative  and cooperative with normal attention span and concentration. No apparent delusions, illusions, hallucinations    ASSESSMENT/PLAN:   Abnormal Pap smear of cervix Being seen regularly by Gyn  TOBACCO DEPENDENCE Stopped smoking one year ago  Preop examination No increased risk or any modifiable risk factors Ok for surgery   Depression with anxiety Seems to be controlled, currently off medications      Carney Living, MD [facility]

## 2019-07-14 NOTE — Assessment & Plan Note (Signed)
Being seen regularly by [facility]

## 2019-07-14 NOTE — Patient Instructions (Signed)
Nice to meet you  I am sorry for the delay and confusion.  I will give feedback about our communication  You should check with your pcp as to the need for a Pap smear

## 2019-07-15 ENCOUNTER — Encounter: Payer: Self-pay | Admitting: Family Medicine

## 2019-07-15 NOTE — Assessment & Plan Note (Signed)
Seems to be controlled, currently off medications

## 2019-07-20 ENCOUNTER — Telehealth: Payer: Self-pay | Admitting: Clinical

## 2019-07-20 NOTE — Telephone Encounter (Signed)
Follow up with pt: Left HIPPA-compliant message to call back Virdell Hoiland from [facility] at [phone] (main office) or [phone] (Jailey Booton's office).   

## 2019-08-27 ENCOUNTER — Other Ambulatory Visit: Payer: Self-pay | Admitting: Family Medicine

## 2019-08-27 ENCOUNTER — Telehealth: Payer: Medicaid Other | Admitting: Emergency Medicine

## 2019-08-27 ENCOUNTER — Ambulatory Visit (INDEPENDENT_AMBULATORY_CARE_PROVIDER_SITE_OTHER): Payer: Medicaid Other | Admitting: Family Medicine

## 2019-08-27 ENCOUNTER — Encounter: Payer: Self-pay | Admitting: Family Medicine

## 2019-08-27 ENCOUNTER — Other Ambulatory Visit: Payer: Self-pay

## 2019-08-27 ENCOUNTER — Telehealth: Payer: Self-pay | Admitting: Family Medicine

## 2019-08-27 ENCOUNTER — Other Ambulatory Visit (HOSPITAL_COMMUNITY)
Admission: RE | Admit: 2019-08-27 | Discharge: 2019-08-27 | Disposition: A | Discharge status: Home / Self Care | Payer: Medicaid Other | Source: Ambulatory Visit | Attending: Family Medicine | Admitting: Family Medicine

## 2019-08-27 VITALS — BP 102/62 | HR 85 | Ht 65.0 in | Wt 172.0 lb

## 2019-08-27 DIAGNOSIS — N760 Acute vaginitis: Secondary | ICD-10-CM | POA: Diagnosis not present

## 2019-08-27 DIAGNOSIS — Z3042 Encounter for surveillance of injectable contraceptive: Secondary | ICD-10-CM | POA: Insufficient documentation

## 2019-08-27 DIAGNOSIS — N898 Other specified noninflammatory disorders of vagina: Secondary | ICD-10-CM | POA: Insufficient documentation

## 2019-08-27 DIAGNOSIS — B9689 Other specified bacterial agents as the cause of diseases classified elsewhere: Secondary | ICD-10-CM | POA: Diagnosis not present

## 2019-08-27 DIAGNOSIS — Z7689 Persons encountering health services in other specified circumstances: Secondary | ICD-10-CM | POA: Diagnosis not present

## 2019-08-27 DIAGNOSIS — Z309 Encounter for contraceptive management, unspecified: Secondary | ICD-10-CM | POA: Diagnosis not present

## 2019-08-27 LAB — POCT WET PREP (WET MOUNT)
Clue Cells Wet Prep Whiff POC: POSITIVE
Trichomonas Wet Prep HPF POC: ABSENT

## 2019-08-27 LAB — POCT URINE PREGNANCY: Preg Test, Ur: NEGATIVE

## 2019-08-27 MED ORDER — MEDROXYPROGESTERONE ACETATE 150 MG/ML IM SUSP: 150.0000 mg | Freq: Once | INTRAMUSCULAR | Status: DC

## 2019-08-27 MED ORDER — MEDROXYPROGESTERONE ACETATE 150 MG/ML IM SUSY
150.0000 mg | PREFILLED_SYRINGE | Freq: Once | INTRAMUSCULAR | Status: AC
  Administered 2019-08-27: 150 mg via INTRAMUSCULAR

## 2019-08-27 MED ORDER — METRONIDAZOLE 500 MG PO TABS: 500.0000 mg | ORAL_TABLET | Freq: Two times a day (BID) | ORAL | 0 refills | Status: AC

## 2019-08-27 NOTE — Assessment & Plan Note (Signed)
Patient has recovered well postoperatively following abdominal liposuction.  Surgical scars on abdomen are healing well.  Patient denies abdominal pain. Medically cleared to return to work from Tuesday 15th June per patient's wishes.

## 2019-08-27 NOTE — Progress Notes (Signed)
E-Visits are not used to request refills. After reviewing your chart, I can see that your medication was called in by your physician to the [facility] on [address]. If it did not go through, please call your doctor tomorrow to follow up or to request additional medication.  Please contact your doctor as soon as possible to manage your prescription. Greater than 5 but less than 10 minutes spent researching, coordinating, and implementing care for this patient today

## 2019-08-27 NOTE — Progress Notes (Signed)
Depo given in RUOQ today.  Site unremarkable & patient tolerated injection.    Next injection due 11/12/19 - 11/26/19.  Reminder card given.    Froilan Mclean, CMA  

## 2019-08-27 NOTE — Telephone Encounter (Signed)
Called patient to inform her of wet prep result however no answer. Did not leave VM due to sensitive information. Will try again later.

## 2019-08-27 NOTE — Telephone Encounter (Signed)
Patient calls nruse line returning provider phone call. I advised patient she tested positive for BV and Flagyl has been sent to her pharmacy. Directions of use given and patient advised other tests should be back in the next couple of days.

## 2019-08-27 NOTE — Assessment & Plan Note (Addendum)
Upreg negative, patient received Depo today.  Provided extensive counseling on risks and benefits of Depo particularly long-term effects of Depo use such as bone density loss, increased risk of breast cancer etc.  Recommended that patient follows up quickly in 1 to 2 months prior to when her next Depo is due and we will talk about other forms of contraception. Would not recommend patient to continue depo further due to long-term risks.

## 2019-08-27 NOTE — Patient Instructions (Signed)
Great to see you today.  We did some swabs from your vagina for your vaginal discharge.  I will be in touch with the results and if you need antibiotics or antifungal medication I will prescribe this.  I given you a letter to return to work next Tuesday 15th.  I am happy to prescribe you the Depo today but given that you have been on this long-term I would like you to come in in 1 to 2 months just for a birth control session where we can discuss further options.  Long-term side effects are as we discussed today such as bone loss, weight gain and mood disturbance.  Please take these into consideration before you choose birth control and see the information about the Depo below.   Please do not hesitate to contact the clinic if you have further questions.  I would be happy to take you on is my patient as Dr. Frances Furbish is leaving the clinic.  Best wishes,  Dr. Allena Katz    Medroxyprogesterone injection [Contraceptive] What is this medicine? MEDROXYPROGESTERONE (me DROX ee proe JES te rone) contraceptive injections prevent pregnancy. They provide effective birth control for 3 months. Depo-subQ Provera 104 is also used for treating pain related to endometriosis. This medicine may be used for other purposes; ask your health care provider or pharmacist if you have questions. COMMON BRAND NAME(S): Depo-Provera, Depo-subQ Provera 104 What should I tell my health care provider before I take this medicine? They need to know if you have any of these conditions  frequently drink alcohol  asthma  blood vessel disease or a history of a blood clot in the lungs or legs  bone disease such as osteoporosis  breast cancer  diabetes  eating disorder (anorexia nervosa or bulimia)  high blood pressure  HIV infection or AIDS  kidney disease  liver disease  mental depression  migraine  seizures (convulsions)  stroke  tobacco smoker  vaginal bleeding  an unusual or allergic reaction to  medroxyprogesterone, other hormones, medicines, foods, dyes, or preservatives  pregnant or trying to get pregnant  breast-feeding How should I use this medicine? Depo-Provera Contraceptive injection is given into a muscle. Depo-subQ Provera 104 injection is given under the skin. These injections are given by a health care professional. You must not be pregnant before getting an injection. The injection is usually given during the first 5 days after the start of a menstrual period or 6 weeks after delivery of a baby. Talk to your pediatrician regarding the use of this medicine in children. Special care may be needed. These injections have been used in female children who have started having menstrual periods. Overdosage: If you think you have taken too much of this medicine contact a poison control center or emergency room at once. NOTE: This medicine is only for you. Do not share this medicine with others. What if I miss a dose? Try not to miss a dose. You must get an injection once every 3 months to maintain birth control. If you cannot keep an appointment, call and reschedule it. If you wait longer than 13 weeks between Depo-Provera contraceptive injections or longer than 14 weeks between Depo-subQ Provera 104 injections, you could get pregnant. Use another method for birth control if you miss your appointment. You may also need a pregnancy test before receiving another injection. What may interact with this medicine? Do not take this medicine with any of the following medications:  bosentan This medicine may also interact with the  following medications:  aminoglutethimide  antibiotics or medicines for infections, especially rifampin, rifabutin, rifapentine, and griseofulvin  aprepitant  barbiturate medicines such as phenobarbital or primidone  bexarotene  carbamazepine  medicines for seizures like ethotoin, felbamate, oxcarbazepine, phenytoin, topiramate  modafinil  St. John's  wort This list may not describe all possible interactions. Give your health care provider a list of all the medicines, herbs, non-prescription drugs, or dietary supplements you use. Also tell them if you smoke, drink alcohol, or use illegal drugs. Some items may interact with your medicine. What should I watch for while using this medicine? This drug does not protect you against HIV infection (AIDS) or other sexually transmitted diseases. Use of this product may cause you to lose calcium from your bones. Loss of calcium may cause weak bones (osteoporosis). Only use this product for more than 2 years if other forms of birth control are not right for you. The longer you use this product for birth control the more likely you will be at risk for weak bones. Ask your health care professional how you can keep strong bones. You may have a change in bleeding pattern or irregular periods. Many females stop having periods while taking this drug. If you have received your injections on time, your chance of being pregnant is very low. If you think you may be pregnant, see your health care professional as soon as possible. Tell your health care professional if you want to get pregnant within the next year. The effect of this medicine may last a long time after you get your last injection. What side effects may I notice from receiving this medicine? Side effects that you should report to your doctor or health care professional as soon as possible:  allergic reactions like skin rash, itching or hives, swelling of the face, lips, or tongue  breast tenderness or discharge  breathing problems  changes in vision  depression  feeling faint or lightheaded, falls  fever  pain in the abdomen, chest, groin, or leg  problems with balance, talking, walking  unusually weak or tired  yellowing of the eyes or skin Side effects that usually do not require medical attention (report to your doctor or health care  professional if they continue or are bothersome):  acne  fluid retention and swelling  headache  irregular periods, spotting, or absent periods  temporary pain, itching, or skin reaction at site where injected  weight gain This list may not describe all possible side effects. Call your doctor for medical advice about side effects. You may report side effects to FDA at 1-800-FDA-1088. Where should I keep my medicine? This does not apply. The injection will be given to you by a health care professional. NOTE: This sheet is a summary. It may not cover all possible information. If you have questions about this medicine, talk to your doctor, pharmacist, or health care provider.  2020 Elsevier/Gold Standard (2008-03-26 18:37:56)

## 2019-08-27 NOTE — Progress Notes (Signed)
° ° °  SUBJECTIVE:   CHIEF COMPLAINT / HPI:   Suzanne Ellis is a 34 yr old female who presents today for vaginal discharge  Vaginal discharge Patient endorses regular bacterial vaginosis infections requiring treatment. Over the last few days has noticed white discharge which smells " funny".  She thinks that she had BV. Noticed specks of blood yesterday on her underwear and usually has irregular bleeding with the Depo. Last Depo was in February.  Patient was last sexually active in April when she used protection.  Her last menstrual period was years ago as she uses the Depo long-term.  Previously has been diagnosed with STDs including chlamydia, gonorrhea and trichomonas which were adequately treated. 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  Depo Patient missed her February appointment for Depo.  She used Depo on and off since she was 34 years old.  She has tried other forms of birth control and did not like them.  She is particularly conscious of devices such as IUD and Nexplanon and has had bad stories about the complications.  She wants to avoid them if it were possible.  Would like to have some sort of contraception today before leaving clinic in case she is to have sexual intercourse in the near future.  Liposuction surgery Patient recently had abdominal liposuction surgery in [place] on 5th May.  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  She has recovered well postoperatively and would like to return to both of her jobs.  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   PERTINENT  PMH / PSH: Liposuction surgery   OBJECTIVE:   BP 102/62    Pulse 85    Ht 5\' 5"  (1.651 m)    Wt 172 lb (78 kg)    BMI 28.62 kg/m   General: Alert, pleasant, no acute distress Cardio: Warm and well-perfused Pulm: No respiratory distress Abdomen: Bowel sounds normal. Abdomen soft and non-tender.  4-5 small surgical scars noted across the peripheral parts of the abdomen and  back.  Surgical scars are healing well.  No bleeding. Neuro: Cranial nerves grossly intact  Pelvic exam chaperoned by CMA Jessica Some tenderness on insertion of speculum.  Cervix visualized.  GC chlamydia and wet mount swabs taken. Internal exam with mild discomfort.  ASSESSMENT/PLAN:   Recurrent bacterial vaginosis Wet prep taken today showing clue cells and positive whiff test.  Tried to contact patient of results and however went to voicemail.  Will call again later and send meds to the pharmacy.  Will notify patient of gonorrhea chlamydia test when available.  Return to work exam Patient has recovered well postoperatively following abdominal liposuction.  Surgical scars on abdomen are healing well.  Patient denies abdominal pain. Medically cleared to return to work from Tuesday 15th June per patient's wishes.  Encounter for Depo-Provera contraception Upreg negative, patient received Depo today.  Provided extensive counseling on risks and benefits of Depo particularly long-term effects of Depo use such as bone density loss, increased risk of breast cancer etc.  Recommended that patient follows up quickly in 1 to 2 months prior to when her next Depo is due and we will talk about other forms of contraception. Would not recommend patient to continue depo further due to long-term risks.      17th June, MD [facility]

## 2019-08-27 NOTE — Assessment & Plan Note (Addendum)
Wet prep taken today showing clue cells and positive whiff test.  Tried to contact patient of results and however went to voicemail.  Will call again later and send meds to the pharmacy.  Will notify patient of gonorrhea chlamydia test when available.

## 2019-08-28 ENCOUNTER — Other Ambulatory Visit: Payer: Self-pay | Admitting: Family Medicine

## 2019-08-28 LAB — CERVICOVAGINAL ANCILLARY ONLY
Chlamydia: NEGATIVE
Comment: NEGATIVE
Comment: NORMAL
Neisseria Gonorrhea: NEGATIVE

## 2019-08-28 MED ORDER — FLUCONAZOLE 150 MG PO TABS: 150.0000 mg | ORAL_TABLET | Freq: Once | ORAL | 0 refills | Status: AC

## 2019-08-28 NOTE — Telephone Encounter (Signed)
Called patient to inform her of her results. Re-iterated what Idalia Needle had said earlier this morning. Unfortunately she did not receive my VM. Pt would like diflucan just incase she gets yeast infection. I said this would be fine and sent this to her pharmacy.

## 2019-09-14 ENCOUNTER — Telehealth: Payer: Self-pay

## 2019-09-14 NOTE — Telephone Encounter (Signed)
Please call patient and ask how many days she had taken as she was initially treated on June 10 and should have been completed on the 17th.  The Diflucan was a one time dose so should have been completed.  She will likely have to make an appointment with PCP to see is she is still symptomatic.  Dana Allan, MD [facility]

## 2019-09-14 NOTE — Telephone Encounter (Signed)
Patient calls nurse line regarding receiving additional medication for BV and yeast. Patient is requesting diflucan and flagyl. Patient states that she left her medications in the car and medication melted. Patient would like refills. Patient would rather not come in if possible. Please advise.  Forwarding to PCP   Veronda Prude, RN

## 2019-09-15 NOTE — Telephone Encounter (Signed)
noted 

## 2019-09-15 NOTE — Telephone Encounter (Signed)
Called and left HIPPA compliant VM for patient to return call to office to schedule appointment.   Veronda Prude, RN

## 2019-09-22 NOTE — Progress Notes (Signed)
    SUBJECTIVE:   CHIEF COMPLAINT / HPI:   [id number] with bv on 6/10.  Only completed 3 days of treatment before pills were left out in car and she threw them away.  Symptoms never full resolved. Complains of feelilng like she constantly has BV.  Denies changes to underwear, laundry, soaps.     PERTINENT  PMH / PSH: hx of bv infections.   OBJECTIVE:   BP 102/62   Pulse 96   Ht 5\' 5"  (1.651 m)   Wt 164 lb 8 oz (74.6 kg)   SpO2 99%   BMI 27.37 kg/m   Gen: alert, no acute distress. Appears stated age.  GU: normal appearing vulva and vaginal walls. Normal appearing cervix. Small amount of white discharge seen near cervix.   ASSESSMENT/PLAN:   Recurrent bacterial vaginosis Wet prep shows clue cells.  Gave pt rx for 7d of metronidazole.  Discussed tips to help prevent bv including proper underwear, avoiding unprotected intercourse with multiple partners. Gave rx for fluconazole for yeast infection post treatment.      , MD [facility]

## 2019-09-23 ENCOUNTER — Ambulatory Visit (INDEPENDENT_AMBULATORY_CARE_PROVIDER_SITE_OTHER): Payer: Medicaid Other | Admitting: Family Medicine

## 2019-09-23 ENCOUNTER — Other Ambulatory Visit (HOSPITAL_COMMUNITY)
Admission: RE | Admit: 2019-09-23 | Discharge: 2019-09-23 | Disposition: A | Discharge status: Home / Self Care | Payer: Medicaid Other | Source: Ambulatory Visit | Attending: Family Medicine | Admitting: Family Medicine

## 2019-09-23 ENCOUNTER — Telehealth: Payer: Self-pay

## 2019-09-23 ENCOUNTER — Other Ambulatory Visit: Payer: Self-pay

## 2019-09-23 VITALS — BP 102/62 | HR 96 | Ht 65.0 in | Wt 164.5 lb

## 2019-09-23 DIAGNOSIS — Z113 Encounter for screening for infections with a predominantly sexual mode of transmission: Secondary | ICD-10-CM

## 2019-09-23 DIAGNOSIS — N760 Acute vaginitis: Secondary | ICD-10-CM

## 2019-09-23 DIAGNOSIS — B9689 Other specified bacterial agents as the cause of diseases classified elsewhere: Secondary | ICD-10-CM | POA: Diagnosis not present

## 2019-09-23 LAB — POCT WET PREP (WET MOUNT)
Clue Cells Wet Prep Whiff POC: POSITIVE
Trichomonas Wet Prep HPF POC: ABSENT

## 2019-09-23 MED ORDER — FLUCONAZOLE 150 MG PO TABS: 150.0000 mg | ORAL_TABLET | Freq: Once | ORAL | 0 refills | Status: AC

## 2019-09-23 MED ORDER — METRONIDAZOLE 500 MG PO TABS: 500.0000 mg | ORAL_TABLET | Freq: Two times a day (BID) | ORAL | 0 refills | Status: DC

## 2019-09-23 NOTE — Telephone Encounter (Signed)
Patient LVM on nurse line requesting returned call from provider regarding lab results. Patient had wet prep this morning in office visit with Dr. Constance Goltz.   Forwarding to Dr. Constance Goltz

## 2019-09-23 NOTE — Patient Instructions (Signed)
I will call you with the results of your tests later this afternoon. We can discuss options for treatment at that time based on the results.

## 2019-09-24 LAB — CERVICOVAGINAL ANCILLARY ONLY
Chlamydia: NEGATIVE
Comment: NEGATIVE
Comment: NEGATIVE
Comment: NORMAL
Neisseria Gonorrhea: NEGATIVE
Trichomonas: NEGATIVE

## 2019-09-24 NOTE — Telephone Encounter (Signed)
I talked with patient yesterday afternoon and we discussed her results.

## 2019-09-24 NOTE — Assessment & Plan Note (Signed)
Wet prep shows clue cells.  Gave pt rx for 7d of metronidazole.  Discussed tips to help prevent bv including proper underwear, avoiding unprotected intercourse with multiple partners. Gave rx for fluconazole for yeast infection post treatment.

## 2019-09-25 ENCOUNTER — Telehealth: Payer: Self-pay | Admitting: *Deleted

## 2019-09-25 NOTE — Telephone Encounter (Signed)
-----   Message from Sandre Kitty, MD sent at 09/25/2019  7:58 AM EDT ----- Negative for gonorrhea and chlamydia.  Will have staff call pt to update her with negative results. She is already aware of the positive BV.

## 2019-09-25 NOTE — Telephone Encounter (Signed)
PT informed of below.Suzanne Ellis, CMA

## 2019-10-13 ENCOUNTER — Telehealth: Payer: Self-pay | Admitting: Family Medicine

## 2019-10-13 ENCOUNTER — Other Ambulatory Visit: Payer: Self-pay

## 2019-10-13 NOTE — Telephone Encounter (Signed)
Called patient to inform her refills for previous antibiotics not appropriate. She has clinic visit on 30th July to discuss recurrent BV and birth control. Patient happy with this plan.

## 2019-10-16 ENCOUNTER — Ambulatory Visit: Payer: Medicaid Other | Admitting: Family Medicine

## 2019-10-23 ENCOUNTER — Encounter: Payer: Self-pay | Admitting: Family Medicine

## 2019-10-23 ENCOUNTER — Other Ambulatory Visit: Payer: Self-pay

## 2019-10-23 ENCOUNTER — Ambulatory Visit (INDEPENDENT_AMBULATORY_CARE_PROVIDER_SITE_OTHER): Payer: Medicaid Other | Admitting: Family Medicine

## 2019-10-23 ENCOUNTER — Other Ambulatory Visit: Payer: Self-pay | Admitting: Family Medicine

## 2019-10-23 VITALS — BP 108/60 | HR 68 | Ht 65.0 in | Wt 167.0 lb

## 2019-10-23 DIAGNOSIS — F3175 Bipolar disorder, in partial remission, most recent episode depressed: Secondary | ICD-10-CM

## 2019-10-23 DIAGNOSIS — N760 Acute vaginitis: Secondary | ICD-10-CM

## 2019-10-23 DIAGNOSIS — B9689 Other specified bacterial agents as the cause of diseases classified elsewhere: Secondary | ICD-10-CM | POA: Diagnosis not present

## 2019-10-23 LAB — POCT WET PREP (WET MOUNT)
Clue Cells Wet Prep Whiff POC: POSITIVE
Trichomonas Wet Prep HPF POC: ABSENT
WBC, Wet Prep HPF POC: >20

## 2019-10-23 MED ORDER — FLUCONAZOLE 150 MG PO TABS: 150.0000 mg | ORAL_TABLET | Freq: Once | ORAL | 0 refills | Status: AC

## 2019-10-23 MED ORDER — METRONIDAZOLE 500 MG PO TABS: 500.0000 mg | ORAL_TABLET | Freq: Two times a day (BID) | ORAL | 0 refills | Status: DC

## 2019-10-23 MED ORDER — VENLAFAXINE HCL ER 37.5 MG PO CP24: 37.5000 mg | ORAL_CAPSULE | Freq: Every day | ORAL | 0 refills | Status: DC

## 2019-10-23 NOTE — Patient Instructions (Signed)
°  Great to see you today. I am sorry you keep getting BV. I am recommended boric acid supppository which you can buy off amazon:  NutraBlast Boric Acid Suppositories 600mg  w/ Vaginal Applicator, 30 Suppositories & 15 Applicators - Feminine Care - Made in  pH-D Feminine Health - 600 mg Boric Acid Suppositories - Woman Owned - for Vaginal Odor Use - 36 Count  It should be 600mg . Please try these and see if it helps. I will refer you to GYN to see if they have any suggestions.  Please follow up with me if you have any further questions  Dr [place]

## 2019-10-25 NOTE — Progress Notes (Signed)
    SUBJECTIVE:   CHIEF COMPLAINT / HPI:   Suzanne Ellis is a 34 yr old female who presents for recurrent BV  Recurrent BV Treated for BV and yeast infection 1 month ago in clinic. She finished the course of Metronidazole and Fluconazole however her symptoms of vaginal discharge quickly returned. She has foul smelling discharge which is frothy white and sometimes also "clumpy white" too. This is at least her third BV infection this year and she is fed up of the symptoms. Denies being sexually active. LMP unknown, she is on depo.   PERTINENT  PMH / PSH: BV, yeast, bipolar   OBJECTIVE:   BP 108/60   Pulse 68   Ht 5\' 5"  (1.651 m)   Wt 167 lb (75.8 kg)   SpO2 98%   BMI 27.79 kg/m   General: Alert and cooperative and appears to be in no acute distress Cardio: well perfused  Pulm: Normal respiratory effort Abdomen: Bowel sounds normal. Abdomen soft and non-tender.  Neuro: Cranial nerves grossly intact  Pelvic exam chaperoned by CMA Jessica. Cervix easily visualized. White discharge noted. Wet prep swabs taken.  ASSESSMENT/PLAN:   Recurrent bacterial vaginosis Wet prep positive for BV and yeast infection. Prescribed Metronidazole 500mg  BID for 7 days and 150mg  Fluconazole one time dose. Recommended patient that she can buy boric acid suppositories 600mg  from [facility] which may provide some relief from her recurrent symptoms of BV. I have also referred her to GYN for recurrent BV infections. Counseled patient on safe sex-wearing condoms, avoiding tight underwear/leggings and avoiding douching etc.     , MD [facility]

## 2019-10-25 NOTE — Assessment & Plan Note (Signed)
Wet prep positive for BV and yeast infection. Prescribed Metronidazole 500mg  BID for 7 days and 150mg  Fluconazole one time dose. Recommended patient that she can buy boric acid suppositories 600mg  from [facility] which may provide some relief from her recurrent symptoms of BV. I have also referred her to GYN for recurrent BV infections. Counseled patient on safe sex-wearing condoms, avoiding tight underwear/leggings and avoiding douching etc.

## 2019-11-12 ENCOUNTER — Ambulatory Visit: Payer: Medicaid Other | Admitting: Family Medicine

## 2019-12-07 ENCOUNTER — Ambulatory Visit (INDEPENDENT_AMBULATORY_CARE_PROVIDER_SITE_OTHER): Payer: Medicaid Other | Admitting: Family Medicine

## 2019-12-07 ENCOUNTER — Other Ambulatory Visit: Payer: Self-pay

## 2019-12-07 ENCOUNTER — Encounter: Payer: Self-pay | Admitting: Family Medicine

## 2019-12-07 VITALS — BP 120/72 | HR 85 | Wt 168.4 lb

## 2019-12-07 DIAGNOSIS — F3175 Bipolar disorder, in partial remission, most recent episode depressed: Secondary | ICD-10-CM | POA: Diagnosis not present

## 2019-12-07 DIAGNOSIS — R8290 Unspecified abnormal findings in urine: Secondary | ICD-10-CM

## 2019-12-07 DIAGNOSIS — Z302 Encounter for sterilization: Secondary | ICD-10-CM | POA: Diagnosis not present

## 2019-12-07 DIAGNOSIS — Z309 Encounter for contraceptive management, unspecified: Secondary | ICD-10-CM | POA: Diagnosis present

## 2019-12-07 LAB — POCT URINALYSIS DIP (MANUAL ENTRY)
Bilirubin, UA: NEGATIVE
Blood, UA: NEGATIVE
Glucose, UA: NEGATIVE mg/dL
Leukocytes, UA: NEGATIVE
Nitrite, UA: NEGATIVE
Spec Grav, UA: >=1.03 — AB (ref 1.010–1.025)
Urobilinogen, UA: 1 E.U./dL
pH, UA: 5.5 (ref 5.0–8.0)

## 2019-12-07 LAB — POCT URINE PREGNANCY: Preg Test, Ur: NEGATIVE

## 2019-12-07 MED ORDER — MEDROXYPROGESTERONE ACETATE 150 MG/ML IM SUSY
150.0000 mg | PREFILLED_SYRINGE | Freq: Once | INTRAMUSCULAR | Status: AC
  Administered 2019-12-07: 150 mg via INTRAMUSCULAR

## 2019-12-07 MED ORDER — VENLAFAXINE HCL ER 75 MG PO CP24: 75.0000 mg | ORAL_CAPSULE | Freq: Every day | ORAL | 0 refills | Status: DC

## 2019-12-07 NOTE — Patient Instructions (Signed)
Enid Cutter to see you today!! We gave you a depo injection today. We both decided that getting your tubes tied is the best decision for birth control. I have referred you OBGYN.  Your urine looked clear, there is no infection. Reduce the amount red bull you drink.  I have increased the dose of your effexor. See you how you feel with this and follow up with me in 1 months time.  Best wishes,  Dr Allena Katz

## 2019-12-08 DIAGNOSIS — Z302 Encounter for sterilization: Secondary | ICD-10-CM | POA: Insufficient documentation

## 2019-12-08 NOTE — Progress Notes (Signed)
    SUBJECTIVE:   CHIEF COMPLAINT / HPI:   Suzanne Ellis is a 34 yr old female who presents today for birth control counseling  Birth counseling  Pt would like to discuss birth control options. She has been on the depo for 15 years. She is against devices inside her body and does not want the IUD or nexplanon.  Abnormal smelling urine Pt endorses a few day history of dysuria and "unusual" smelling urine. Denies fevers, frequency, hematuria, flank pain or abdominal pain. Has been drinking many cans of red bull a day as it helps with staying awake for work.  PERTINENT  PMH / PSH: migraine, PID, BV  OBJECTIVE:   BP 120/72   Pulse 85   Wt 168 lb 6.4 oz (76.4 kg)   SpO2 97%   BMI 28.02 kg/m    General: Alert, no acute distress, well appearing  Cardio: Well perfused  Pulm: Normal respiratory effort Abdomen: Bowel sounds normal. Abdomen, non distended soft and non-tender.  Extremities: No peripheral edema. Neuro: Cranial nerves grossly intact  ASSESSMENT/PLAN:   Encounter for tubal ligation Counseled pt on long term side effects on depo such a loss of bone density and recommended against long term depo injections. Discussed other options such as IUD, tubal ligation etc. Pt does not wish to have further children. She is happy to have a tubal ligation. I explained risk and benefits. She chose to proceed with this. I have referred pt to OBGYN. U preg negative today. I gave depo injection today which should cover her until she gets her tubal ligation. Pt was happy with this plan.  Foul smelling urine UA negative for nitrites or leukocytes. UTI unlikely. Smell of urine is likely due to drinking multiple cans of red bull as it contains caffeine. Counseled patient to reduce quantity of these drinks and stay adequately hydrated. Follow up if persistent symptoms.     Towanda Octave, MD PGY-2 [facility]

## 2019-12-08 NOTE — Assessment & Plan Note (Signed)
UA negative for nitrites or leukocytes. UTI unlikely. Smell of urine is likely due to drinking multiple cans of red bull as it contains caffeine. Counseled patient to reduce quantity of these drinks and stay adequately hydrated. Follow up if persistent symptoms.

## 2019-12-08 NOTE — Assessment & Plan Note (Signed)
Counseled pt on long term side effects on depo such a loss of bone density and recommended against long term depo injections. Discussed other options such as IUD, tubal ligation etc. Pt does not wish to have further children. She is happy to have a tubal ligation. I explained risk and benefits. She chose to proceed with this. I have referred pt to OBGYN. U preg negative today. I gave depo injection today which should cover her until she gets her tubal ligation. Pt was happy with this plan.

## 2020-01-04 ENCOUNTER — Ambulatory Visit: Payer: Medicaid Other

## 2020-01-29 ENCOUNTER — Other Ambulatory Visit (HOSPITAL_COMMUNITY)
Admission: RE | Admit: 2020-01-29 | Discharge: 2020-01-29 | Disposition: A | Discharge status: Home / Self Care | Payer: Medicaid Other | Source: Ambulatory Visit | Attending: Family Medicine | Admitting: Family Medicine

## 2020-01-29 ENCOUNTER — Other Ambulatory Visit: Payer: Self-pay | Admitting: Family Medicine

## 2020-01-29 ENCOUNTER — Other Ambulatory Visit: Payer: Self-pay | Admitting: Student in an Organized Health Care Education/Training Program

## 2020-01-29 ENCOUNTER — Ambulatory Visit (INDEPENDENT_AMBULATORY_CARE_PROVIDER_SITE_OTHER): Payer: Medicaid Other | Admitting: Student in an Organized Health Care Education/Training Program

## 2020-01-29 ENCOUNTER — Encounter: Payer: Self-pay | Admitting: Student in an Organized Health Care Education/Training Program

## 2020-01-29 ENCOUNTER — Other Ambulatory Visit: Payer: Self-pay

## 2020-01-29 VITALS — BP 110/75 | HR 96 | Ht 65.0 in | Wt 169.4 lb

## 2020-01-29 DIAGNOSIS — F3175 Bipolar disorder, in partial remission, most recent episode depressed: Secondary | ICD-10-CM

## 2020-01-29 DIAGNOSIS — Z113 Encounter for screening for infections with a predominantly sexual mode of transmission: Secondary | ICD-10-CM

## 2020-01-29 DIAGNOSIS — R8290 Unspecified abnormal findings in urine: Secondary | ICD-10-CM

## 2020-01-29 DIAGNOSIS — F332 Major depressive disorder, recurrent severe without psychotic features: Secondary | ICD-10-CM

## 2020-01-29 LAB — POCT URINALYSIS DIP (MANUAL ENTRY)
Bilirubin, UA: NEGATIVE
Blood, UA: NEGATIVE
Glucose, UA: NEGATIVE mg/dL
Ketones, POC UA: NEGATIVE mg/dL
Leukocytes, UA: NEGATIVE
Nitrite, UA: POSITIVE — AB
Protein Ur, POC: NEGATIVE mg/dL
Spec Grav, UA: 1.02 (ref 1.010–1.025)
Urobilinogen, UA: 0.2 E.U./dL
pH, UA: 5.5 (ref 5.0–8.0)

## 2020-01-29 LAB — POCT WET PREP (WET MOUNT)
Clue Cells Wet Prep Whiff POC: NEGATIVE
Trichomonas Wet Prep HPF POC: ABSENT

## 2020-01-29 MED ORDER — CEPHALEXIN 500 MG PO CAPS: 500.0000 mg | ORAL_CAPSULE | Freq: Four times a day (QID) | ORAL | 0 refills | Status: AC

## 2020-01-29 MED ORDER — BUPROPION HCL ER (SR) 100 MG PO TB12: 100.0000 mg | ORAL_TABLET | Freq: Two times a day (BID) | ORAL | 0 refills | Status: DC

## 2020-01-29 MED ORDER — FLUCONAZOLE 150 MG PO TABS: 150.0000 mg | ORAL_TABLET | Freq: Once | ORAL | 0 refills | Status: AC

## 2020-01-29 NOTE — Progress Notes (Signed)
Dr. Dareen Piano consulted me due to pt endorsing SI.  S: Pt reported she has thoughts of not wanting to be here. Pt denied plan and denied intent. Pt reported protective factors include her 3 children.  Pt and Dr. Shawnee Knapp discussed safety planning which included listening to music, watching holiday films and suicide hotline number was given.  Pt reported she feels comfortable engaging in these coping strategies.   O: Pt tearful upon presentation. Pt engaged and oriented x4.  A: Pt experiencing passive SI related to current life stressors.  P: Pt given suicide hotline and [facility] info.  Pt f/u with Dr. Shawnee Knapp 11/16 at 9am.

## 2020-01-29 NOTE — Assessment & Plan Note (Signed)
Patient denies any history of mania. Unsure of bipolar dx. She has been on venlafaxine for extended time without incidence and endorses some improvement. She has SI today and is seeking help. Met with our psychologist today and set up apt Tue at [place] for follow up. - prescribed wellbutrin as well - follow up with PCP in 2 weeks

## 2020-01-29 NOTE — Progress Notes (Signed)
Treating UTI and yeast with keflex and diflucan

## 2020-01-29 NOTE — Patient Instructions (Signed)
It was a pleasure to see you today!  To summarize our discussion for this visit:  Today we are checking for causes of your urine and pelvic symptoms such as infection. I will notify you with the results.   I'm glad that you came to me with concerns about your mental well-being. You're going to start counseling here next week and we discussed medications to also help.  Some additional health maintenance measures we should update are: Health Maintenance Due  Topic Date Due  . COVID-19 Vaccine (2 - Moderna 2-dose series) 10/17/2019  . INFLUENZA VACCINE  10/18/2019  .    Call the clinic at [phone] if your symptoms worsen or you have any concerns.   Thank you for allowing me to take part in your care,  Dr. Jamelle Rushing   Pelvic Inflammatory Disease  Pelvic inflammatory disease (PID) is an infection in some or all of the female reproductive organs. PID can be in the womb (uterus), ovaries, fallopian tubes, or nearby tissues that are inside the lower belly area (pelvis). PID can lead to problems if it is not treated. What are the causes?  Germs (bacteria) that are spread during sex. This is the most common cause.  Germs in the vagina that are not spread during sex.  Germs that travel up from the vagina or cervix to the reproductive organs after: ? The birth of a baby. ? A miscarriage. ? An abortion. ? Pelvic surgery. ? Insertion of an intrauterine device (IUD). ? A sexual assault. What increases the risk?  Being younger than 35 years old.  Having sex at a young age.  Having a history of STI (sexually transmitted infection) or PID.  Not using barrier birth control, such as condoms.  Having a lot of sex partners.  Having sex with someone who has symptoms of an STI.  Using a douche.  Having an IUD put in place. What are the signs or symptoms?  Pain in the belly area.  Fever.  Chills.  Discharge from the vagina that is not normal.  Bleeding from the  womb that is not normal.  Pain soon after the end of a menstrual period.  Pain when you pee (urinate).  Pain with sex.  Feeling sick to your stomach (nauseous) or throwing up (vomiting). How is this treated?  Antibiotic medicines. In very bad cases, these may be given through an IV tube.  Surgery. This is rare.  Efforts to stop the spread of the infection. Sex partners may need to be treated. It may take weeks until you feel all better. Your doctor may test you for infection again after you finish treatment. You should also be checked for HIV (human immunodeficiency virus). Follow these instructions at home:  Take over-the-counter and prescription medicines only as told by your doctor.  If you were prescribed an antibiotic medicine, take it as told by your doctor. Do not stop taking it even if you start to feel better.  Do not have sex until treatment is done or as told by your doctor.  Tell your sex partner if you have PID. Your partner may need to be treated.  Keep all follow-up visits as told by your doctor. This is important. Contact a doctor if:  You have more fluid or fluid that is not normal coming from your vagina.  Your pain does not improve.  You throw up.  You have a fever.  You cannot take your medicines.  Your partner has an STI.  You have pain when you pee. Get help right away if:  You have more pain in the belly area.  You have chills.  You are not better in 72 hours with treatment. Summary  Pelvic inflammatory disease (PID) is caused by an infection in some or all of the female reproductive organs.  PID is a serious infection.  This infection is most often treated with antibiotics.  Do not have sex until treatment is done or as told by your doctor. This information is not intended to replace advice given to you by your health care provider. Make sure you discuss any questions you have with your health care provider. Document Revised:  11/21/2017 Document Reviewed: 11/27/2017 Elsevier Patient Education  2020 [employer].

## 2020-01-29 NOTE — Progress Notes (Deleted)
   SUBJECTIVE:   CHIEF COMPLAINT / HPI: issues with uterus Pressure when urinating and has strong odor, STD check   ***  Denies mania   PERTINENT  PMH / PSH: PID, BTL, abnormal pap smear  OBJECTIVE:   There were no vitals taken for this visit.  ***  ASSESSMENT/PLAN:   No problem-specific Assessment & Plan notes found for this encounter.   tues 9am  Leeroy Bock, DO [facility]

## 2020-01-29 NOTE — Assessment & Plan Note (Signed)
Suspicious for chronic PID symptoms.  R/o active infections Pelvic exam with gc/chlamydia, wet prep swabs, HIV, RPR Urinalysis to r/o UTI

## 2020-01-29 NOTE — Progress Notes (Signed)
   SUBJECTIVE:   CHIEF COMPLAINT / HPI: urine abnormalities, STD screen  Urine concerns- strong odor for several months despite increasing fluid intake. Associated with pelvic and lower back discomfort. Has discomfort in her pelvis when urinating but has no dysuria. Tylenol did not help.  Denies vomiting,  Endorses nausea, and diarrhea intermittently 1x/week. Not sexually active. August was last time for intercourse. No unprotected sex since last checked for STDs.  Relevant hx: Has h/o chlamydia and gonorrhea, trichomonis.  PID, BTL, abnormal pap smear Discharge- malodorous but no increase in volume. Denies itching.  LMP- very long time ago. Does not get periods on depo. Last shot sept 20th.   BBL and lipo 07/22/2019.  Depression- no counseling or psychiatrist now. Currently on venlafaxine for 2 years and increased dosage 2 months ago with some improvent.  Current thoughts of killing herself. No plan.   OBJECTIVE:   BP 110/75   Pulse 96   Ht $R'5\' 5"'AF$  (1.651 m)   Wt 169 lb 6.4 oz (76.8 kg)   SpO2 99%   BMI 28.19 kg/m   General: NAD, pleasant, able to participate in exam Pelvic: positive mucous discharge. No other lesions or abnormalities. No adnexal masses. Positive cervical motion tenderness.  Abdomen: soft, nontender, nondistended, no hepatic or splenomegaly, +BS Extremities: no edema. WWP. Skin: warm and dry, no rashes noted Neuro: alert and oriented, no focal deficits Psych: Normal affect and mood  ASSESSMENT/PLAN:   Screen for STD (sexually transmitted disease) Suspicious for chronic PID symptoms.  R/o active infections Pelvic exam with gc/chlamydia, wet prep swabs, HIV, RPR Urinalysis to r/o UTI  MDD (major depressive disorder) Patient denies any history of mania. Unsure of bipolar dx. She has been on venlafaxine for extended time without incidence and endorses some improvement. She has SI today and is seeking help. Met with our psychologist today and set up apt Tue at  [place] for follow up. - prescribed wellbutrin as well - follow up with PCP in 2 weeks   Richarda Osmond, [place]

## 2020-01-30 LAB — HIV ANTIBODY (ROUTINE TESTING W REFLEX): HIV Screen 4th Generation wRfx: NONREACTIVE

## 2020-01-30 LAB — RPR: RPR Ser Ql: NONREACTIVE

## 2020-01-30 MED ORDER — BUPROPION HCL ER (SR) 100 MG PO TB12: 100.0000 mg | ORAL_TABLET | Freq: Two times a day (BID) | ORAL | 0 refills | Status: DC

## 2020-01-30 MED ORDER — VENLAFAXINE HCL ER 75 MG PO CP24
75.0000 mg | ORAL_CAPSULE | Freq: Every day | ORAL | 0 refills | Status: DC
Start: 2020-01-30 — End: 2020-03-24

## 2020-01-30 MED ORDER — METRONIDAZOLE 500 MG PO TABS
500.0000 mg | ORAL_TABLET | Freq: Two times a day (BID) | ORAL | 0 refills | Status: DC
Start: 2020-01-30 — End: 2020-02-01

## 2020-02-01 ENCOUNTER — Other Ambulatory Visit: Payer: Self-pay | Admitting: Family Medicine

## 2020-02-01 LAB — CERVICOVAGINAL ANCILLARY ONLY
Bacterial Vaginitis (gardnerella): POSITIVE — AB
Chlamydia: NEGATIVE
Comment: NEGATIVE
Comment: NEGATIVE
Comment: NEGATIVE
Comment: NORMAL
Neisseria Gonorrhea: NEGATIVE
Trichomonas: NEGATIVE

## 2020-02-01 MED ORDER — METRONIDAZOLE 500 MG PO TABS
500.0000 mg | ORAL_TABLET | Freq: Two times a day (BID) | ORAL | 0 refills | Status: DC
Start: 2020-02-01 — End: 2020-04-23

## 2020-02-02 ENCOUNTER — Telehealth: Payer: Self-pay | Admitting: Psychology

## 2020-02-02 ENCOUNTER — Ambulatory Visit: Payer: Medicaid Other | Admitting: Psychology

## 2020-02-02 NOTE — Telephone Encounter (Signed)
Called and LVM regarding missed appt.

## 2020-02-13 ENCOUNTER — Telehealth: Payer: Self-pay | Admitting: Family Medicine

## 2020-02-13 NOTE — Telephone Encounter (Signed)
Second attempt to contact patient after calling after hours line. Left HIPAA compliant VM.  Shirlean Mylar, MD [facility], PGY-2

## 2020-02-13 NOTE — Telephone Encounter (Signed)
Patient called after hours line. Attempted to call back x1 and left HIPAA compliant VM.   Shirlean Mylar, MD [facility], PGY-2

## 2020-02-15 NOTE — Progress Notes (Deleted)
     SUBJECTIVE:   CHIEF COMPLAINT / HPI:   Suzanne Ellis is a 34 y.o. female presents for follow up for depression  Depression Seen in clinic 2 weeks ago for SI. Pt usually takes Venlafaxine 75mg  daily. She was prescribed Wellbutrin 100mg  BID.  She was referred to Dr for therapy however did not follow up.  Reports low mood/depressive symptoms for ** .  Sleep ** Hobbies ** Guilt/worthlessness ** Energy ** Concentration ** Appetite ** Suicidal ideation **, thoughts of self harm/harm to others ***. Has/has not previously tried anti-depressants ** Has/has not had manic episodes in the past. ** Has/has not tried therapy before. **  Health maintenance  Covid vaccine: pending  Flu vaccine: pending  Pap: Up to date. 03/23/19  PERTINENT  PMH / PSH: Depression, bacterial vaginosis   OBJECTIVE:   There were no vitals taken for this visit.  General: Alert, no acute distress, well appearing, pleasant  Cardio: Normal S1 and S2, RRR, no r/m/g Pulm: CTAB, normal work of breathing Abdomen: Bowel sounds normal. Abdomen soft and non-tender.  Extremities: No peripheral edema.  Neuro: Cranial nerves grossly intact   ASSESSMENT/PLAN:   No problem-specific Assessment & Plan notes found for this encounter.     Shawnee Knapp, MD [facility]

## 2020-02-16 ENCOUNTER — Ambulatory Visit: Payer: Medicaid Other | Admitting: Family Medicine

## 2020-02-19 ENCOUNTER — Telehealth: Payer: Self-pay | Admitting: Psychology

## 2020-02-19 NOTE — Telephone Encounter (Signed)
Called pt and scheduled appt for 12/8 at 11AM. Pt denied SI.

## 2020-02-22 ENCOUNTER — Ambulatory Visit: Payer: Medicaid Other | Admitting: Obstetrics and Gynecology

## 2020-02-24 ENCOUNTER — Ambulatory Visit: Payer: Medicaid Other | Admitting: Psychology

## 2020-02-29 ENCOUNTER — Telehealth: Payer: Self-pay | Admitting: Family Medicine

## 2020-02-29 NOTE — Telephone Encounter (Signed)
**  After Hours/ Emergency Line Call**  Received a page to call [phone]) - [phone].  Patient: Suzanne Ellis  Caller: Self  Confirmed name & DOB of patient with caller  Subjective:  "I think I"m having a heart attack".  Sudden onset cramping in between shoulder blades that moved from there to right arm. Feels like a bad cramp and has been worsening over the last hour. Was not doing anything at work when this started. Has not tried anything to make it better. No other associated symptoms.  No anterior chest pain. Denies sweating, nausea, vomiting when this happened. Starting to feel nauseous. No dizziness, lightheaded, pain with breathing. No recent accidents, no new work outs. No family history of sudden cardiac death.   Objective:  Observations: sounds tearful over the phone. Normal respirations.   Assessment & Plan  Suzanne Ellis is a 34 y.o. female with PMHx s/f depression with anxiety, migraines, tobacco use, Depo-Provera for contraception who calls with the following complaints and concerns:    Sudden onset back pain between her shoulder blades that radiates to the right arm Patient is concern for having a heart attack.  I am less concerned for this given area of pain and lack of associated symptoms.  Additionally, patient does not have any further risk factors, no history of sudden cardiac death in her family, no history of hypertension or cardiac disease.  Her history is not consistent with a pulmonary embolism or aortic dissection.  Can also consider pancreatitis, but patient does deny any nausea vomiting.  Given sudden onset in nature, this is likely more so musculoskeletal.  I have offered patient an appointment to be seen in the office.  She has an appointment with Dr. Jolyne Loa on 02/22/2015.  She is requesting an appointment for this day, though there is room for her to be seen sooner. I have given her return precautions for the emergency department, which includes, any difficulty breathing  or pain with breathing, worsening pain, lightheadedness, diaphoresis, dizziness associated with anterior chest pain.   -- Red flags discussed.   -- Will forward to PCP.  Melene Plan, M.D.  02/29/2020 11:33 PM

## 2020-03-01 DIAGNOSIS — R0789 Other chest pain: Secondary | ICD-10-CM | POA: Diagnosis not present

## 2020-03-01 DIAGNOSIS — R079 Chest pain, unspecified: Secondary | ICD-10-CM | POA: Diagnosis not present

## 2020-03-01 DIAGNOSIS — R457 State of emotional shock and stress, unspecified: Secondary | ICD-10-CM | POA: Diagnosis not present

## 2020-03-01 DIAGNOSIS — R110 Nausea: Secondary | ICD-10-CM | POA: Diagnosis not present

## 2020-03-01 DIAGNOSIS — R202 Paresthesia of skin: Secondary | ICD-10-CM | POA: Diagnosis not present

## 2020-03-01 NOTE — Telephone Encounter (Signed)
Noted and agreed. Thanks Dr Selena Batten.

## 2020-03-02 ENCOUNTER — Other Ambulatory Visit: Payer: Self-pay

## 2020-03-02 ENCOUNTER — Ambulatory Visit (INDEPENDENT_AMBULATORY_CARE_PROVIDER_SITE_OTHER): Payer: Medicaid Other | Admitting: Family Medicine

## 2020-03-02 ENCOUNTER — Telehealth: Payer: Self-pay | Admitting: Family Medicine

## 2020-03-02 VITALS — BP 110/72 | HR 80 | Ht 65.0 in | Wt 167.4 lb

## 2020-03-02 DIAGNOSIS — T148XXA Other injury of unspecified body region, initial encounter: Secondary | ICD-10-CM

## 2020-03-02 MED ORDER — IBUPROFEN 800 MG PO TABS: 800.0000 mg | ORAL_TABLET | Freq: Three times a day (TID) | ORAL | 0 refills | Status: AC | PRN

## 2020-03-02 MED ORDER — BACLOFEN 10 MG PO TABS: 10.0000 mg | ORAL_TABLET | Freq: Three times a day (TID) | ORAL | 0 refills | Status: AC

## 2020-03-02 NOTE — Progress Notes (Signed)
Saw pt to check in about appt and SI.  Pt endorsed passive SI. Denied plan and intent.  Resources have been given at previous appt. F/u appt tomorrow with me   Royetta Asal, PhD., LMFT-A

## 2020-03-02 NOTE — Patient Instructions (Signed)
It was wonderful to see you today.  Please bring ALL of your medications with you to every visit.   Today we talked about:  Your back and shoulder pain may be due to muscle strain.  I have prescribed ibuprofen and baclofen.  Also consider using over-the-counter lidocaine patches on your right shoulder.  Below I have provided some exercises to help.  If it is not getting better within 1 to 2 weeks please follow-up.   Go to ED if you have any symptoms of chest pain, shortness of breath, nausea vomiting or fever.  Please be sure to schedule follow up at the front  desk before you leave today.   Please call the clinic at [phone] if your symptoms worsen or you have any concerns. It was our pleasure to serve you.  Dr. Salvadore Dom   Thoracic Strain Rehab Ask your health care provider which exercises are safe for you. Do exercises exactly as told by your health care provider and adjust them as directed. It is normal to feel mild stretching, pulling, tightness, or discomfort as you do these exercises. Stop right away if you feel sudden pain or your pain gets worse. Do not begin these exercises until told by your health care provider. Stretching and range-of-motion exercise This exercise warms up your muscles and joints and improves the movement and flexibility of your back and shoulders. This exercise also helps to relieve pain. Chest and spine stretch  1. Lie down on your back on a firm surface. 2. Roll a towel or a small blanket so it is about 4 inches (10 cm) in diameter. 3. Put the towel lengthwise under the middle of your back so it is under your spine, but not under your shoulder blades. 4. Put your hands behind your head and let your elbows fall to your sides. This will increase your stretch. 5. Take a deep breath (inhale). 6. Hold for __________ seconds. 7. Relax after you breathe out (exhale). Repeat __________ times. Complete this exercise __________ times a day. Strengthening  exercises These exercises build strength and endurance in your back and your shoulder blade muscles. Endurance is the ability to use your muscles for a long time, even after they get tired. Alternating arm and leg raises  1. Get on your hands and knees on a firm surface. If you are on a hard floor, you may want to use padding, such as an exercise mat, to cushion your knees. 2. Line up your arms and legs. Your hands should be directly below your shoulders, and your knees should be directly below your hips. 3. Lift your left leg behind you. At the same time, raise your right arm and straighten it in front of you. ? Do not lift your leg higher than your hip. ? Do not lift your arm higher than your shoulder. ? Keep your abdominal and back muscles tight. ? Keep your hips facing the ground. ? Do not arch your back. ? Keep your balance carefully, and do not hold your breath. 4. Hold for __________ seconds. 5. Slowly return to the starting position and repeat with your right leg and your left arm. Repeat __________ times. Complete this exercise __________ times a day. Straight arm rows This exercise is also called shoulder extension exercise. 1. Stand with your feet shoulder width apart. 2. Secure an exercise band to a stable object in front of you so the band is at or above shoulder height. 3. Hold one end of the exercise band  in each hand. 4. Straighten your elbows and lift your hands up to shoulder height. 5. Step back, away from the secured end of the exercise band, until the band stretches. 6. Squeeze your shoulder blades together and pull your hands down to the sides of your thighs. Stop when your hands are straight down by your sides. This is shoulder extension. Do not let your hands go behind your body. 7. Hold for __________ seconds. 8. Slowly return to the starting position. Repeat __________ times. Complete this exercise __________ times a day. Prone shoulder external rotation 1. Lie  on your abdomen on a firm bed so your left / right forearm hangs over the edge of the bed and your upper arm is on the bed, straight out from your body. This is the prone position. ? Your elbow should be bent. ? Your palm should be facing your feet. 2. If instructed, hold a __________ weight in your hand. 3. Squeeze your shoulder blade toward the middle of your back. Do not let your shoulder lift toward your ear. 4. Keep your elbow bent in a 90-degree angle (right angle) while you slowly move your forearm up toward the ceiling. Move your forearm up to the height of the bed, toward your head. This is external rotation. ? Your upper arm should not move. ? At the top of the movement, your palm should face the floor. 5. Hold for __________ seconds. 6. Slowly return to the starting position and relax your muscles. Repeat __________ times. Complete this exercise __________ times a day. Rowing scapular retraction This is an exercise in which the shoulder blades (scapulae) are pulled toward each other (retraction). 1. Sit in a stable chair without armrests, or stand up. 2. Secure an exercise band to a stable object in front of you so the band is at shoulder height. 3. Hold one end of the exercise band in each hand. Your palms should face down. 4. Bring your arms out straight in front of you. 5. Step back, away from the secured end of the exercise band, until the band stretches. 6. Pull the band backward. As you do this, bend your elbows and squeeze your shoulder blades together, but avoid letting the rest of your body move. Do not shrug your shoulders upward while you do this. 7. Stop when your elbows are at your sides or slightly behind your body. 8. Hold for __________ seconds. 9. Slowly straighten your arms to return to the starting position. Repeat __________ times. Complete this exercise __________ times a day. Posture and body mechanics Good posture and healthy body mechanics can help to  relieve stress in your body's tissues and joints. Body mechanics refers to the movements and positions of your body while you do your daily activities. Posture is part of body mechanics. Good posture means:  Your spine is in its natural S-curve position (neutral).  Your shoulders are pulled back slightly.  Your head is not tipped forward. Follow these guidelines to improve your posture and body mechanics in your everyday activities. Standing   When standing, keep your spine neutral and your feet about hip width apart. Keep a slight bend in your knees. Your ears, shoulders, and hips should line up with each other.  When you do a task in which you lean forward while standing in one place for a long time, place one foot up on a stable object that is 2-4 inches (5-10 cm) high, such as a footstool. This helps keep your spine neutral.  Sitting   When sitting, keep your spine neutral and keep your feet flat on the floor. Use a footrest, if necessary, and keep your thighs parallel to the floor. Avoid rounding your shoulders, and avoid tilting your head forward.  When working at a desk or a computer, keep your desk at a height where your hands are slightly lower than your elbows. Slide your chair under your desk so you are close enough to maintain good posture.  When working at a computer, place your monitor at a height where you are looking straight ahead and you do not have to tilt your head forward or downward to look at the screen. Resting When lying down and resting, avoid positions that are most painful for you.  If you have pain with activities such as sitting, bending, stooping, or squatting (flexion-basedactivities), lie in a position in which your body does not bend very much. For example, avoid curling up on your side with your arms and knees near your chest (fetal position).  If you have pain with activities such as standing for a long time or reaching with your arms  (extension-basedactivities), lie with your spine in a neutral position and bend your knees slightly. Try the following positions: ? Lie on your side with a pillow between your knees. ? Lie on your back with a pillow under your knees.  Lifting   When lifting objects, keep your feet at least shoulder width apart and tighten your abdominal muscles.  Bend your knees and hips and keep your spine neutral. It is important to lift using the strength of your legs, not your back. Do not lock your knees straight out.  Always ask for help to lift heavy or awkward objects. This information is not intended to replace advice given to you by your health care provider. Make sure you discuss any questions you have with your health care provider. Document Revised: 06/27/2018 Document Reviewed: 04/14/2018 Elsevier Patient Education  2020 [employer].

## 2020-03-02 NOTE — Telephone Encounter (Signed)
Patient's call was transferred to me. Hx of right shoulder pain radiating to her right arm. No chest pain. She will like to be seen today. 11 AM appointment made. She confirmed she can get here before 11.

## 2020-03-02 NOTE — Progress Notes (Signed)
    SUBJECTIVE:   CHIEF COMPLAINT / HPI:   [name] is a 34 yo F who presents for the issue below.  Right shoulder pain Experiencing shoulder pain for 3 days. Was standing at work when she felt a sudden sharp mid-back pain that radiated to her right shoulder blade and took her breath away. Right arm with following paresthesia feeling and like a bad cramp. 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 This has never happened before. She also feels like her arm is weak now. Took (2) 250 mg aspirins which helped. Patient was concerned that she was having a heart attack. Read on the Internet that heart attacks can present atypically.   Denies chest pain, dyspnea, low back pain, abdominal pain, or dysuria.   PERTINENT  PMH / PSH: Depression/anxiety  OBJECTIVE:   BP 110/72   Pulse 80   Ht 5\' 5"  (1.651 m)   Wt 167 lb 6.4 oz (75.9 kg)   SpO2 97%   BMI 27.86 kg/m   General: Appears well, no acute distress. Age appropriate. Cardiac: RRR, normal heart sounds, no murmurs Respiratory: CTAB, normal effort MSK/Extremities: Point tenderness over right shoulder blade. No hypertonic musculature appreciable with surrounding structures. Counterstrain technique performed with patient in the supine position. 4/10 pain reduction achieved.  Skin: Warm and dry, no rashes noted Neuro: alert and oriented, no focal deficits Psych: normal affect  ASSESSMENT/PLAN:  Muscle strain Acute x3 days. Point tenderness found over the infraspinatus muscle. Counterstrain performed for 90 seconds w/ 4/10 reduction achieve. Patient endorsed some relief following technique. Low suspicion for MI or aortic dissection w/ lack of concerning symptoms and stable vitals. Discussed return precautions with patient.  - ibuprofen (ADVIL) 800 MG tablet; Take 1 tablet (800 mg total) by mouth every 8 (eight) hours as needed for up to 10 days.  Dispense: 30 tablet; Refill: 0 - baclofen (LIORESAL)  10 MG tablet; Take 1 tablet (10 mg total) by mouth 3 (three) times daily for 10 days.  Dispense: 30 tablet; Refill: 0  , DO  [facility]

## 2020-03-03 ENCOUNTER — Ambulatory Visit: Payer: Medicaid Other

## 2020-03-03 ENCOUNTER — Ambulatory Visit: Payer: Medicaid Other | Admitting: Psychology

## 2020-03-24 ENCOUNTER — Other Ambulatory Visit: Payer: Self-pay | Admitting: Family Medicine

## 2020-03-24 DIAGNOSIS — F3175 Bipolar disorder, in partial remission, most recent episode depressed: Secondary | ICD-10-CM

## 2020-03-24 MED ORDER — VENLAFAXINE HCL ER 75 MG PO CP24: 75.0000 mg | ORAL_CAPSULE | Freq: Every day | ORAL | 0 refills | Status: DC

## 2020-03-24 MED ORDER — BUPROPION HCL ER (SR) 100 MG PO TB12
100.0000 mg | ORAL_TABLET | Freq: Two times a day (BID) | ORAL | 0 refills | Status: DC
Start: 2020-03-24 — End: 2020-04-23

## 2020-04-22 ENCOUNTER — Encounter: Payer: Self-pay | Admitting: Family Medicine

## 2020-04-22 DIAGNOSIS — F3175 Bipolar disorder, in partial remission, most recent episode depressed: Secondary | ICD-10-CM

## 2020-04-23 MED ORDER — METRONIDAZOLE 500 MG PO TABS
500.0000 mg | ORAL_TABLET | Freq: Two times a day (BID) | ORAL | 0 refills | Status: DC
Start: 2020-04-23 — End: 2020-08-10

## 2020-04-23 MED ORDER — VENLAFAXINE HCL ER 75 MG PO CP24: 75.0000 mg | ORAL_CAPSULE | Freq: Every day | ORAL | 0 refills | Status: DC

## 2020-04-23 MED ORDER — FLUCONAZOLE 150 MG PO TABS: 150.0000 mg | ORAL_TABLET | Freq: Once | ORAL | 0 refills | Status: AC

## 2020-04-23 MED ORDER — BUPROPION HCL ER (SR) 100 MG PO TB12
100.0000 mg | ORAL_TABLET | Freq: Two times a day (BID) | ORAL | 0 refills | Status: DC
Start: 2020-04-23 — End: 2020-08-07

## 2020-06-23 ENCOUNTER — Ambulatory Visit: Payer: Medicaid Other | Admitting: Family Medicine

## 2020-07-12 ENCOUNTER — Ambulatory Visit: Payer: Medicaid Other | Admitting: Family Medicine

## 2020-07-12 NOTE — Progress Notes (Deleted)
     SUBJECTIVE:   CHIEF COMPLAINT / HPI:   Suzanne Ellis is a 35 y.o. female presents for birth control counseling   Birth control counseling  Previously has tried depo for 15 years. Back in 12/06/20 referred to OBGYN for tubal ligation. Since then pt reports ***. Sexually active with ***. Using *** for birth control.  Flowsheet Row Office Visit from 10/23/2019 in [facility]  PHQ-9 Total Score 11       Health Maintenance Due  Topic  . COVID-19 Vaccine (2 - Moderna 3-dose series)      PERTINENT  PMH / PSH: bacterial vaginosis, migraine with aura, depression   OBJECTIVE:   There were no vitals taken for this visit.   General: Alert, no acute distress Cardio: Normal S1 and S2, RRR, no r/m/g Pulm: CTAB, normal work of breathing Abdomen: Bowel sounds normal. Abdomen soft and non-tender.  Extremities: No peripheral edema.  Neuro: Cranial nerves grossly intact   ASSESSMENT/PLAN:   No problem-specific Assessment & Plan notes found for this encounter.     Towanda Octave, MD PGY-2 [facility]

## 2020-08-02 ENCOUNTER — Ambulatory Visit: Payer: Medicaid Other | Admitting: Family Medicine

## 2020-08-07 ENCOUNTER — Other Ambulatory Visit: Payer: Self-pay | Admitting: Family Medicine

## 2020-08-07 ENCOUNTER — Encounter: Payer: Self-pay | Admitting: Family Medicine

## 2020-08-07 DIAGNOSIS — F3175 Bipolar disorder, in partial remission, most recent episode depressed: Secondary | ICD-10-CM

## 2020-08-08 MED ORDER — BUPROPION HCL ER (SR) 100 MG PO TB12: 100.0000 mg | ORAL_TABLET | Freq: Two times a day (BID) | ORAL | 0 refills | Status: DC

## 2020-08-08 MED ORDER — VENLAFAXINE HCL ER 75 MG PO CP24
75.0000 mg | ORAL_CAPSULE | Freq: Every day | ORAL | 0 refills | Status: DC
Start: 2020-08-08 — End: 2021-06-13

## 2020-08-10 NOTE — Telephone Encounter (Signed)
I have pended the medication that the patient is requesting for recurrent BV.   Veronda Prude, RN

## 2020-08-11 MED ORDER — METRONIDAZOLE 500 MG PO TABS: 500.0000 mg | ORAL_TABLET | Freq: Two times a day (BID) | ORAL | 0 refills | Status: DC

## 2020-09-14 ENCOUNTER — Encounter: Payer: Self-pay | Admitting: Family Medicine

## 2020-09-15 ENCOUNTER — Other Ambulatory Visit: Payer: Self-pay | Admitting: Family Medicine

## 2020-09-15 MED ORDER — FLUCONAZOLE 150 MG PO TABS: 150.0000 mg | ORAL_TABLET | Freq: Once | ORAL | 0 refills | Status: AC

## 2020-10-25 ENCOUNTER — Telehealth: Payer: Self-pay | Admitting: Family Medicine

## 2020-10-25 ENCOUNTER — Other Ambulatory Visit: Payer: Self-pay | Admitting: Family Medicine

## 2020-10-25 ENCOUNTER — Ambulatory Visit (INDEPENDENT_AMBULATORY_CARE_PROVIDER_SITE_OTHER): Payer: Medicaid Other | Admitting: Family Medicine

## 2020-10-25 DIAGNOSIS — Z5329 Procedure and treatment not carried out because of patient's decision for other reasons: Secondary | ICD-10-CM

## 2020-10-25 NOTE — Telephone Encounter (Signed)
Called patient regarding multiple no-shows however went to voicemail.  Left a HIPAA compliant voicemail, will try again later.

## 2020-10-25 NOTE — Progress Notes (Deleted)
     SUBJECTIVE:   CHIEF COMPLAINT / HPI:   Suzanne Ellis is a 35 y.o. female presents for concern for UTI   Urinary symptoms lower abdominal pain, dysuria, frequency, urgency or hematuria.      ***  Flowsheet Row Office Visit from 10/23/2019 in [facility]  PHQ-9 Total Score 11        Health Maintenance Due  Topic   COVID-19 Vaccine (2 - Moderna series)   INFLUENZA VACCINE       PERTINENT  PMH / PSH:   OBJECTIVE:   There were no vitals taken for this visit.   General: Alert, no acute distress Cardio: Normal S1 and S2, RRR, no r/m/g Pulm: CTAB, normal work of breathing Abdomen: Bowel sounds normal. Abdomen soft and non-tender.  Extremities: No peripheral edema.  Neuro: Cranial nerves grossly intact   ASSESSMENT/PLAN:   No problem-specific Assessment & Plan notes found for this encounter.     Towanda Octave, MD PGY-3 [facility]

## 2020-10-26 NOTE — Progress Notes (Signed)
No show

## 2020-10-31 ENCOUNTER — Encounter: Payer: Self-pay | Admitting: Family Medicine

## 2020-11-29 DIAGNOSIS — R300 Dysuria: Secondary | ICD-10-CM | POA: Diagnosis not present

## 2020-11-29 DIAGNOSIS — R39198 Other difficulties with micturition: Secondary | ICD-10-CM | POA: Diagnosis not present

## 2020-11-29 DIAGNOSIS — R319 Hematuria, unspecified: Secondary | ICD-10-CM | POA: Diagnosis not present

## 2020-11-29 DIAGNOSIS — R109 Unspecified abdominal pain: Secondary | ICD-10-CM | POA: Diagnosis not present

## 2020-11-30 ENCOUNTER — Telehealth: Payer: Self-pay

## 2020-11-30 NOTE — Telephone Encounter (Signed)
Transition Care Management Unsuccessful Follow-up Telephone Call  Date of discharge and from where:  11/29/2020-[facility]   Attempts:  1st Attempt  Reason for unsuccessful TCM follow-up call:  Left voice message

## 2020-12-01 DIAGNOSIS — N898 Other specified noninflammatory disorders of vagina: Secondary | ICD-10-CM | POA: Diagnosis not present

## 2020-12-01 DIAGNOSIS — R8290 Unspecified abnormal findings in urine: Secondary | ICD-10-CM | POA: Diagnosis not present

## 2020-12-01 DIAGNOSIS — Z113 Encounter for screening for infections with a predominantly sexual mode of transmission: Secondary | ICD-10-CM | POA: Diagnosis not present

## 2020-12-01 NOTE — Telephone Encounter (Signed)
Transition Care Management Follow-up Telephone Call Date of discharge and from where: 11/29/2020 from [facility] How have you been since you were released from the hospital? Pt stated that she is having the same issue. Pt was not able to stay at the ED on Tuesday due to work commitments. Pt is at [facility] now waiting on test results.  Any questions or concerns? No  Items Reviewed: Did the pt receive and understand the discharge instructions provided? Yes  Medications obtained and verified? Yes  Other? No  Any new allergies since your discharge? No  Dietary orders reviewed? No Do you have support at home? Yes   Functional Questionnaire: (I = Independent and D = Dependent) ADLs: I  Bathing/Dressing- I  Meal Prep- I  Eating- I  Maintaining continence- I  Transferring/Ambulation- I  Managing Meds- I  Follow up appointments reviewed:  PCP Hospital f/u appt confirmed? No   Specialist Hospital f/u appt confirmed? No   Are transportation arrangements needed? No  If their condition worsens, is the pt aware to call PCP or go to the Emergency Dept.? Yes Was the patient provided with contact information for the PCP's office or ED? Yes Was to pt encouraged to call back with questions or concerns? Yes

## 2020-12-02 ENCOUNTER — Telehealth: Payer: Self-pay | Admitting: *Deleted

## 2020-12-02 NOTE — Telephone Encounter (Signed)
Patient called about results from urgent care yesterday.  She states that she has tried contacting them multiple times today but since the PA she saw isn't in the office today, they were unable to relay them to her.  I informed patient of results and she has already picked up her flagyl for BV.  She is asking that PCP send in diflucan to go along with it and she often takes them both.  Patient would also like to speak with MD concerning the patient and irritation that she is having that feels different than her chronic BV.  Will forward to MD to advise. Signora Zucco,CMA

## 2020-12-06 ENCOUNTER — Other Ambulatory Visit: Payer: Self-pay | Admitting: Family Medicine

## 2020-12-06 MED ORDER — FLUCONAZOLE 150 MG PO TABS: 150.0000 mg | ORAL_TABLET | Freq: Once | ORAL | 0 refills | Status: AC

## 2020-12-06 NOTE — Telephone Encounter (Signed)
I have sent diflucan to the pharmacy. Pt will need a clinic to discuss any further medical problems. She has no showed multiple times and has been sent a letter of warning that she may be removed from the practice if she has further no shows. Please could you let the pt know and this policy. Thank you.

## 2021-01-05 ENCOUNTER — Ambulatory Visit: Payer: Medicaid Other | Admitting: Family Medicine

## 2021-01-06 ENCOUNTER — Telehealth: Payer: Self-pay | Admitting: Family Medicine

## 2021-01-06 NOTE — Telephone Encounter (Signed)
-----   Message from Shirlean Mylar, MD sent at 01/06/2021 10:44 AM EDT ----- Regarding: No show policy Hi Dr. Lum Babe,  This is a patient of Poonam's who has had 4 no shows in the last year (07/12/20, 08/02/20, 10/25/20, and 01/05/21), including a visit with me yesterday. She received a letter that was sent by Poonam on 10/25/20 as well as a MyChart message on 10/31/20 that if she missed another appointment, she would be subject to the no show policy. Given that she had appropriate warning about the no show policy and still had a no show yesterday, she does meet criteria to be dismissed from what I can tell. I do not know this patient and Poonam is of course out on maternity leave, so I wanted to ask you your thoughts on best next steps.  Thanks,  [employer]

## 2021-01-06 NOTE — Telephone Encounter (Signed)
I received a message from Dr. Chauncey Reading regarding this patient who missed her appointment on 01/05/21. Upon record review, she has missed more than three appointments within six months in addition to same-day cancelation. PCP sent her a certified no-show letter in August regarding her no-shows. She stated that she recently moved to [place] and has been forgetting her appointment lately.  I discussed dismissal with her since she met the requirement and reminded her of the policy regarding canceling appointments at least 24 hours ahead of time. She became irate and stated that she called and left a voicemail on the RN's phone yesterday to cancel and requested to speak to my boss. I gave her Dr. Verlon Au name, and she will contact the front office to connect with her. I advised her that I would check on the voicemail with RN as this might change the plan, but she immediately hung up on me. I checked with the RN team, who confirmed she never left a voicemail yesterday. I will send her a certified dismissal letter.

## 2021-01-06 NOTE — Telephone Encounter (Signed)
error 

## 2021-01-25 ENCOUNTER — Ambulatory Visit: Payer: Medicaid Other | Admitting: Family Medicine

## 2021-01-25 ENCOUNTER — Other Ambulatory Visit: Payer: Self-pay

## 2021-01-25 ENCOUNTER — Encounter: Payer: Self-pay | Admitting: Family Medicine

## 2021-01-25 VITALS — BP 126/79 | HR 96 | Ht 65.0 in | Wt 161.2 lb

## 2021-01-25 DIAGNOSIS — R8290 Unspecified abnormal findings in urine: Secondary | ICD-10-CM

## 2021-01-25 DIAGNOSIS — B9689 Other specified bacterial agents as the cause of diseases classified elsewhere: Secondary | ICD-10-CM | POA: Diagnosis not present

## 2021-01-25 DIAGNOSIS — N760 Acute vaginitis: Secondary | ICD-10-CM | POA: Diagnosis not present

## 2021-01-25 DIAGNOSIS — F418 Other specified anxiety disorders: Secondary | ICD-10-CM

## 2021-01-25 LAB — POCT URINALYSIS DIP (MANUAL ENTRY)
Blood, UA: NEGATIVE
Glucose, UA: NEGATIVE mg/dL
Nitrite, UA: POSITIVE — AB
Protein Ur, POC: 30 mg/dL — AB
Spec Grav, UA: 1.025 (ref 1.010–1.025)
Urobilinogen, UA: 2 E.U./dL — AB
pH, UA: 6 (ref 5.0–8.0)

## 2021-01-25 MED ORDER — FLUCONAZOLE 150 MG PO TABS: 150.0000 mg | ORAL_TABLET | Freq: Once | ORAL | 1 refills | Status: DC

## 2021-01-25 MED ORDER — CEPHALEXIN 500 MG PO CAPS: ORAL_CAPSULE | ORAL | 0 refills | Status: DC

## 2021-01-25 MED ORDER — METRONIDAZOLE 500 MG PO TABS: 500.0000 mg | ORAL_TABLET | Freq: Two times a day (BID) | ORAL | 0 refills | Status: DC

## 2021-01-25 NOTE — Assessment & Plan Note (Signed)
Long discussion with her.  She has complicated constellation of symptoms so I think accurate diagnosis would best be made by psychiatry and hopefully they can then address with the appropriate medications.  I will continue the venlafaxine at this time as she did see some improvement on it.  I have given her a list of resources for psychiatry.  If she has any problems setting up an appointment, she will let me know.

## 2021-01-25 NOTE — Assessment & Plan Note (Signed)
We will treat with full course of metronidazole.  She has used boric acid suppositories in the past with some success but she is never used them regularly as a prophylaxis.  We will start her on 1 vaginal tablet weekly and see how she does.  I would do this after she has completed her metronidazole.  If she continues to have recurrent, we might increase that to twice a week or try alternative prophylactic regimen.  She continues to have symptoms of recurrence, she will follow-up with me in the next few months.

## 2021-01-25 NOTE — Patient Instructions (Addendum)
I am sending in prescription for your urinary tract infection.  Keflex is also called cephalexin and you will take 1 tab 3 times a day for 7 days.  I have sent in some Diflucan to treat any yeast infection.  I would recommend taking the Diflucan after you complete the cephalexin.  I Am also sending in 7 days of metronidazole which you will take twice a day for the bacterial vaginosis.  Regarding the recurrent nature of your bacterial vaginosis, when she completed the treatment with the metronidazole and your symptoms are gone, please start taking 1 boric acid tablet per vagina weekly.  Let see if this will decrease the number of times you have this.  If you continue to have problems, please let me know.  As we discussed, I think your mental health issues are fairly severe and  also pretty complicated.  I would recommend you call one of the numbers below, find out who takes your insurance and who has appointments available that would work with your schedule.  Please get an appointment and be evaluated by psychiatry.  If you  having problems getting this set up, please let me know.     Psychiatry Resource List (Adults and Children) Most of these providers will take Medicaid. please consult your insurance for a complete and updated list of available providers. When calling to make an appointment have your insurance information available to confirm you are covered.   [facility]:Psychiatry and Counseling [address] [place], [place] [address] [phone]  [facility]  [address] [place], [place] [phone] Crisis [phone]   [facility]:   [facility]: [address]     [phone]   [facility]: [address] [place]. [place],        [phone] [place]: [phone],    [phone] [place]: [phone] [address],                   [phone] Children: [facility] [address]         [phone]  [facility] (virtual only) [phone]   [facility]  (Psychiatry only; Adults /children 12 and over, will take Medicaid)  [address] Laurell Josephs [address], [place], [place] [address]       [phone]   [facility] (Psychiatry & counseling ; adults & children ; will take Medicaid [address]  [place] [address]  Go on-line to complete referral ( [email or web address] [phone]    (Spanish speaking therapists)  [facility]  Psychiatry & counseling; Adults and children;  Call Registration prior to scheduling an appointment [phone] [address]    [place], [place] [address]    [phone]  [facility] (Psychiatry & counseling; adults & children; Medicare no Medicaid)  [address]   [place], [place]  [address]      [phone]    [facility] (up to age 17)  Psychiatry & counseling ,will take Medicaid, must do counseling to receive psychiatry services  [address] [place] [address]        [phone]  [facility] (Psychiatry & counseling; adults & children; will take Medicaid) Will need a referral from provider [address],  [place], [place]  [phone]   [facility] --- Walk-In Mon-Friday 8am-3pm ( will take  Medicaid, Psychiatry, Adults & children,  [address] [place], [place]   [phone]   [facility] [place]--, Walk-in M-F 8am-12pm and 1pm -3pm   (Counseling, Psychiatry, will take Medicaid, adults & children)  [address], [place], [place]  [phone]

## 2021-01-25 NOTE — Progress Notes (Signed)
    CHIEF COMPLAINT / HPI: #1.  Vaginal discharge with a fishy odor.  States she has had this multiple times in the past.  It usually clears up with metronidazole but then it recurs.  It seems like it recurs every 4 to 6 weeks.  She does not use douche. 2.  Having some bladder discomfort and pressure.  Feels like a urinary tract infection.  No fever.  Urine is also been somewhat darker than normal. 3.  Mental health issues: She notes that over the last 4 years she has not done as well as previously.  Last year her PCP placed her on venlafaxine and that helped initially but now it does not seem to be helping as much.  Her symptoms are: Anxiety.  Difficulty completing projects.  Sometimes intrusive thoughts and sometimes racing thoughts.  Insomnia some nights with average of 4 to 6 hours of sleep.  Occasionally she have a night where she sleeps not at all.  Then she will have a several days where she sleeps excessively 10 or more hours.  This is interfering with her job, interfering with doing things like homework with her 71-year-old son.  She says she has been diagnosed in the past with multiple different diagnoses including bipolar, anxiety, depression.  She has not had any recent suicidal ideation.   PERTINENT  PMH / PSH: I have reviewed the patient's medications, allergies, past medical and surgical history, smoking status and updated in the EMR as appropriate.   OBJECTIVE:  BP 126/79   Pulse 96   Ht 5\' 5"  (1.651 m)   Wt 161 lb 3.2 oz (73.1 kg)   LMP 01/16/2021 (Exact Date)   SpO2 100%   BMI 26.83 kg/m  PSYCH: AxOx4. Good eye contact.. No psychomotor retardation or agitation. Appropriate speech fluency and content. Asks and answers questions appropriately. Mood is congruent.   ASSESSMENT / PLAN: UTI: Urinalysis positive for nitrites.  Symptoms consistent with UTI.  Will treat with cephalexin 500 3 times daily x7 days.  As she frequently gets yeast infections after this, I will also give her  prescription for Diflucan.  If her symptoms do not resolve with this, she needs to follow-up.   Recurrent bacterial vaginosis We will treat with full course of metronidazole.  She has used boric acid suppositories in the past with some success but she is never used them regularly as a prophylaxis.  We will start her on 1 vaginal tablet weekly and see how she does.  I would do this after she has completed her metronidazole.  If she continues to have recurrent, we might increase that to twice a week or try alternative prophylactic regimen.  She continues to have symptoms of recurrence, she will follow-up with me in the next few months.  Depression with anxiety Long discussion with her.  She has complicated constellation of symptoms so I think accurate diagnosis would best be made by psychiatry and hopefully they can then address with the appropriate medications.  I will continue the venlafaxine at this time as she did see some improvement on it.  I have given her a list of resources for psychiatry.  If she has any problems setting up an appointment, she will let me know.     01/18/2021 MD

## 2021-03-21 DIAGNOSIS — R0602 Shortness of breath: Secondary | ICD-10-CM | POA: Diagnosis not present

## 2021-03-21 DIAGNOSIS — O98511 Other viral diseases complicating pregnancy, first trimester: Secondary | ICD-10-CM | POA: Diagnosis not present

## 2021-03-21 DIAGNOSIS — O2311 Infections of bladder in pregnancy, first trimester: Secondary | ICD-10-CM | POA: Diagnosis not present

## 2021-03-21 DIAGNOSIS — Z20822 Contact with and (suspected) exposure to covid-19: Secondary | ICD-10-CM | POA: Diagnosis not present

## 2021-03-21 DIAGNOSIS — U071 COVID-19: Secondary | ICD-10-CM | POA: Diagnosis not present

## 2021-03-21 DIAGNOSIS — Z3A10 10 weeks gestation of pregnancy: Secondary | ICD-10-CM | POA: Diagnosis not present

## 2021-03-22 DIAGNOSIS — R000 Tachycardia, unspecified: Secondary | ICD-10-CM | POA: Diagnosis not present

## 2021-03-23 ENCOUNTER — Telehealth: Payer: Self-pay

## 2021-03-23 NOTE — Telephone Encounter (Signed)
Transition Care Management Unsuccessful Follow-up Telephone Call  Date of discharge and from where:  03/22/2021 from [facility]  Attempts:  1st Attempt  Reason for unsuccessful TCM follow-up call:  Left voice message

## 2021-03-24 NOTE — Telephone Encounter (Signed)
Transition Care Management Follow-up Telephone Call Date of discharge and from where: 03/21/2021 from [facility] How have you been since you were released from the hospital? Pt stated that she is still feeling bad and has began to experience pain in her right lung. Pt has started abx rx'ed by [facility].  Any questions or concerns? No  Items Reviewed: Did the pt receive and understand the discharge instructions provided? Yes  Medications obtained and verified? Yes  Other? Yes  Any new allergies since your discharge? No  Dietary orders reviewed? No Do you have support at home? Yes   Functional Questionnaire: (I = Independent and D = Dependent) ADLs: I  Bathing/Dressing- I  Meal Prep- I  Eating- I  Maintaining continence- I  Transferring/Ambulation- I  Managing Meds- I   Follow up appointments reviewed:  PCP Hospital f/u appt confirmed? No  Pt encouraged to reach out to PCP for advise due to new pain when breathing and coughing.  Specialist Hospital f/u appt confirmed? No   Are transportation arrangements needed? No  If their condition worsens, is the pt aware to call PCP or go to the Emergency Dept.? Yes Was the patient provided with contact information for the PCP's office or ED? Yes Was to pt encouraged to call back with questions or concerns? Yes

## 2021-03-24 NOTE — Telephone Encounter (Signed)
LATE ENTRY - Call placed to patient approximately 01/17/21  Dr. Jennette Kettle and I contacted patient.  She was unaware that she was in danger of being dismissed from our practice.  She had not seen the mychart message nor received the VM.  Pt is very apologetic and understanding as why her multiple noshows were not only disruptive to clinic but also not in her best interest/care.  Dr. Jennette Kettle agrees to be her new PCP but on a trial basis as long as she keeps her appts.  Pt is agreeable and thankful. Jone Baseman, CMA

## 2021-03-29 ENCOUNTER — Ambulatory Visit: Payer: Medicaid Other | Admitting: Family Medicine

## 2021-04-11 ENCOUNTER — Telehealth: Payer: Self-pay | Admitting: *Deleted

## 2021-04-11 NOTE — Telephone Encounter (Signed)
Contacted patient to inquire about recent no-show with Dr. Jennette Kettle.  Pt was very apologetic and states, " I know why you are calling me.  I'm sorry and I know you are calling to tell me I can't continue care there".    Pt reports that she made the appt and then went to the urgent care for the appointment and forgot to call us and cancel. During UC visit patient found out she was [redacted] weeks pregnant.  After a discussion with Dr. Jennette Kettle, we have decided to give her one more chance.  Pt started to cry and thank [place] for giving another chance.  Pt is aware that this exception was made because she is a long term patient with [facility] and is currently pregnant. Stressed that no more exceptions will be made, no matter the circumstances. Pt is agreeable and again very appreciative.  She is unsure of plans for pregnancy but will let me know in the next week.  We discussed that if she does follow thru with pregnancy, Dr. Jennette Kettle would not be able to see her, but we would allow her to see a resident for [facility] care ( if she does not need care @ [facility] due to her age).  Jone Baseman, CMA

## 2021-05-12 ENCOUNTER — Encounter: Payer: Self-pay | Admitting: Family Medicine

## 2021-05-12 ENCOUNTER — Ambulatory Visit: Payer: Medicaid Other | Admitting: Family Medicine

## 2021-05-12 NOTE — Progress Notes (Signed)
Patient has no-showed to multiple appointments in a 6-month period. Per our no-show policy, a letter has been routed to [facility] Admin to be mailed to patient regarding likely dismissal for repeat no show. Will CC to PCP.  ° °

## 2021-05-16 ENCOUNTER — Encounter: Payer: Self-pay | Admitting: Family Medicine

## 2021-05-16 ENCOUNTER — Other Ambulatory Visit: Payer: Self-pay | Admitting: Family Medicine

## 2021-05-16 ENCOUNTER — Other Ambulatory Visit (HOSPITAL_COMMUNITY)
Admission: RE | Admit: 2021-05-16 | Discharge: 2021-05-16 | Disposition: A | Discharge status: Home / Self Care | Payer: Medicaid Other | Source: Ambulatory Visit | Attending: Family Medicine | Admitting: Family Medicine

## 2021-05-16 ENCOUNTER — Ambulatory Visit (INDEPENDENT_AMBULATORY_CARE_PROVIDER_SITE_OTHER): Payer: Medicaid Other | Admitting: Family Medicine

## 2021-05-16 ENCOUNTER — Other Ambulatory Visit: Payer: Self-pay

## 2021-05-16 VITALS — BP 124/74 | HR 104 | Wt 165.2 lb

## 2021-05-16 DIAGNOSIS — N760 Acute vaginitis: Secondary | ICD-10-CM

## 2021-05-16 DIAGNOSIS — Z113 Encounter for screening for infections with a predominantly sexual mode of transmission: Secondary | ICD-10-CM | POA: Diagnosis not present

## 2021-05-16 DIAGNOSIS — N898 Other specified noninflammatory disorders of vagina: Secondary | ICD-10-CM

## 2021-05-16 DIAGNOSIS — F332 Major depressive disorder, recurrent severe without psychotic features: Secondary | ICD-10-CM | POA: Diagnosis not present

## 2021-05-16 DIAGNOSIS — F339 Major depressive disorder, recurrent, unspecified: Secondary | ICD-10-CM

## 2021-05-16 DIAGNOSIS — B9689 Other specified bacterial agents as the cause of diseases classified elsewhere: Secondary | ICD-10-CM

## 2021-05-16 MED ORDER — CITALOPRAM HYDROBROMIDE 20 MG PO TABS: 20.0000 mg | ORAL_TABLET | Freq: Every day | ORAL | 0 refills | Status: DC

## 2021-05-16 NOTE — Patient Instructions (Signed)
Thank you for coming to see me today. It was a pleasure. Today we discussed your mood, we started you on Celexa 20mg  daily. Recommend you start counseling too.   We performed STD testing today. This will take a few days to come back. If your MyChart is activated, we will message you on there if everything is normal, otherwise we will call. If we need to treat something we will also call you. If you do not hear from in the next 4 days, please give [place] a call.   Please follow-up with me in 2 weeks   If you have any questions or concerns, please do not hesitate to call the office at [phone].  Best wishes,  Dr [phone]     If you are feeling suicidal or depression symptoms worsen please immediately go to:   If you are thinking about harming yourself or having thoughts of suicide, or if you know someone who is, seek help right away. If you are in crisis, make sure you are not left alone.  If someone else is in crisis, make sure he/she/they is not left alone  Call 988 OR 1-800-273-TALK  24 Hour Availability for Walk-IN services  [facility]  [address] [place], [place] [facility] [place] Crisis [phone]   Other crisis resources:  [facility] [phone] (Domestic Violence, Rape & Victim Assistance [phone]  [facility] [phone] Crisis Services    (ONLY from 8am-4pm)    6102855193  [facility] Mobile Crisis Unit (24/7)   [phone]  [phone] National Suicide Hotline   (321)776-5436 8-377-939-6886)

## 2021-05-16 NOTE — Assessment & Plan Note (Signed)
Routine STD screening today.

## 2021-05-16 NOTE — Assessment & Plan Note (Signed)
PHQ 9 21 today with positive question 9. Suzanne Ellis endorses feeling "very very depressed". She also thinks about death but does not want to be dead as she is afraid of it. No plans or intention. Protective factors include her family. No hx of mania and bipolar disorder. She is open to starting anti-depressant today. Started Celexa 20mg , increase as needed. Counseled on side effects. Recommended therapy and referred to psychiatry for further management per patient's request. Suicide info line provided. F/u with me in 2 weeks.

## 2021-05-16 NOTE — Progress Notes (Signed)
° ° ° °  SUBJECTIVE:   CHIEF COMPLAINT / HPI:   Suzanne Ellis is a 36 y.o. female presents for abnormal PHQ9  Vaginal Discharge Pt thinks she has BV again. She has these infections chronically. She wants metronidazole for it.   Abnormal question 9, PHQ9 Pt has thoughts of suicide. But denies plans or intention. She is afraid to die. Hx of suicide attempt when teenager. Took tylenol OD. Pt feels it is her life. She has 3 kids, 1 grandchild and sister with Aspergers syndrome. She feels overwhelmed and it is "a lot" for her to handle. She feels she has a lot to be grateful for. Per chart review pt has hx of bipolar and mania. Went to [place] recently and re-diagnosed with MDD and PTSD by Dr Helene Kelp. Also suffers from "extreme anxiety". She feels high functioning as she has no choice but to provide for her family. She is interested in therapy. Recent abortion at 15 weeks last month after coming off depo. She doesn't regret the abortion but feels bad that she had to do it so late in the pregnancy.  Flowsheet Row Office Visit from 05/16/2021 in [facility]  PHQ-9 Total Score 21      PERTINENT  PMH / PSH: anxiety, BV, PTSD   OBJECTIVE:   BP 124/74    Pulse (!) 104    Wt 165 lb 3.2 oz (74.9 kg)    SpO2 99%    BMI 27.49 kg/m    General: Alert, no acute distress Cardio: well perfused  Pulm: normal work of breathing Neuro: Cranial nerves grossly intact    Pelvic Exam chaperoned by CMA [place]        External: normal female genitalia without lesions or masses        Vagina: normal without lesions or masses        Cervix: normal without lesions or masses        Samples for Wet prep, GC/Chlamydia obtained   ASSESSMENT/PLAN:   Screen for STD (sexually transmitted disease) Routine STD screening today.   Recurrent bacterial vaginosis Obtained wet prep today for possible BV. Pending results.   MDD (major depressive disorder) PHQ 9 21 today with positive question 9.  Suzanne Ellis endorses feeling "very very depressed". She also thinks about death but does not want to be dead as she is afraid of it. No plans or intention. Protective factors include her family. No hx of mania and bipolar disorder. She is open to starting anti-depressant today. Started Celexa 20mg , increase as needed. Counseled on side effects. Recommended therapy and referred to psychiatry for further management per patient's request. Suicide info line provided. F/u with me in 2 weeks.     , MD PGY-3 [facility]

## 2021-05-16 NOTE — Assessment & Plan Note (Signed)
Obtained wet prep today for possible BV. Pending results.

## 2021-05-17 LAB — POCT WET PREP (WET MOUNT)
Clue Cells Wet Prep Whiff POC: POSITIVE
Trichomonas Wet Prep HPF POC: ABSENT

## 2021-05-17 LAB — CERVICOVAGINAL ANCILLARY ONLY
Chlamydia: NEGATIVE
Comment: NEGATIVE
Comment: NORMAL
Neisseria Gonorrhea: NEGATIVE

## 2021-05-17 MED ORDER — METRONIDAZOLE 500 MG PO TABS: 500.0000 mg | ORAL_TABLET | Freq: Two times a day (BID) | ORAL | 0 refills | Status: DC

## 2021-05-31 ENCOUNTER — Ambulatory Visit: Payer: Medicaid Other | Admitting: Family Medicine

## 2021-06-13 ENCOUNTER — Other Ambulatory Visit: Payer: Self-pay | Admitting: Family Medicine

## 2021-06-13 NOTE — Telephone Encounter (Signed)
Dear [facility] Team ?Please let her know I have given one month iof refills but she needs appt in next 1 month ?THANKS! ?Denny Levy ? ?

## 2021-06-13 NOTE — Telephone Encounter (Signed)
Pt informed. Pt understands she needs to be seen for more refills. [name], CMA ? ?

## 2021-06-14 ENCOUNTER — Telehealth (HOSPITAL_COMMUNITY): Payer: Self-pay | Admitting: Clinical

## 2021-06-19 ENCOUNTER — Ambulatory Visit (HOSPITAL_COMMUNITY): Payer: Self-pay | Admitting: Clinical

## 2021-06-21 ENCOUNTER — Telehealth (HOSPITAL_COMMUNITY): Payer: Self-pay | Admitting: Psychiatry

## 2021-07-28 ENCOUNTER — Other Ambulatory Visit: Payer: Self-pay

## 2021-07-28 MED ORDER — CITALOPRAM HYDROBROMIDE 20 MG PO TABS: ORAL_TABLET | ORAL | 0 refills | Status: DC

## 2021-08-22 ENCOUNTER — Encounter: Payer: Self-pay | Admitting: *Deleted

## 2021-08-30 ENCOUNTER — Other Ambulatory Visit: Payer: Self-pay | Admitting: *Deleted

## 2021-08-31 MED ORDER — CITALOPRAM HYDROBROMIDE 20 MG PO TABS: ORAL_TABLET | ORAL | 0 refills | Status: DC

## 2021-09-06 ENCOUNTER — Ambulatory Visit (INDEPENDENT_AMBULATORY_CARE_PROVIDER_SITE_OTHER): Payer: Medicaid Other | Admitting: Family Medicine

## 2021-09-06 ENCOUNTER — Encounter: Payer: Self-pay | Admitting: Family Medicine

## 2021-09-06 VITALS — BP 112/75 | HR 92 | Ht 65.0 in | Wt 168.8 lb

## 2021-09-06 DIAGNOSIS — F332 Major depressive disorder, recurrent severe without psychotic features: Secondary | ICD-10-CM | POA: Diagnosis not present

## 2021-09-06 DIAGNOSIS — Z309 Encounter for contraceptive management, unspecified: Secondary | ICD-10-CM | POA: Diagnosis not present

## 2021-09-06 DIAGNOSIS — N760 Acute vaginitis: Secondary | ICD-10-CM

## 2021-09-06 DIAGNOSIS — B9689 Other specified bacterial agents as the cause of diseases classified elsewhere: Secondary | ICD-10-CM

## 2021-09-06 LAB — POCT URINE PREGNANCY: Preg Test, Ur: NEGATIVE

## 2021-09-06 MED ORDER — MEDROXYPROGESTERONE ACETATE 150 MG/ML IM SUSP
150.0000 mg | Freq: Once | INTRAMUSCULAR | Status: AC
  Administered 2021-09-06: 150 mg via INTRAMUSCULAR

## 2021-09-06 MED ORDER — METRONIDAZOLE 500 MG PO TABS
500.0000 mg | ORAL_TABLET | Freq: Two times a day (BID) | ORAL | 5 refills | Status: DC
Start: 2021-09-06 — End: 2022-04-05

## 2021-09-06 MED ORDER — BUPROPION HCL ER (XL) 150 MG PO TB24: 150.0000 mg | ORAL_TABLET | Freq: Every day | ORAL | 2 refills | Status: DC

## 2021-09-06 MED ORDER — METRONIDAZOLE 500 MG PO TABS: 500.0000 mg | ORAL_TABLET | Freq: Two times a day (BID) | ORAL | 0 refills | Status: DC

## 2021-09-06 NOTE — Progress Notes (Signed)
Patient here today for Depo Provera injection and is not within her dates. Urine pregnancy performed and it was negative.     Depo given in RUOQ today.  Site unremarkable & patient tolerated injection.    Next injection due Sept 6th-Sept 20th.  Reminder card given.    [place] Wardlow Zimmerman Rumple, CMA

## 2021-09-06 NOTE — Patient Instructions (Addendum)
We are staring an additional medicine for your depression today, it is called bupropion.  Take it once a day.  You can either take it at the same time as the Celexa or you can take them at different times.  If you have any new issues or problems with this medicine, please let me know.  As we discussed, I will plan on seeing you back in the next weeks to place a Nexplanon and we can see how you are doing with the additional medicine at the same time.  I am also calling in a prescription for treatment for BV as we discussed.  Below is a list of some counseling resources.  I do think it would be very beneficial to you to take this time for yourself and get into some counseling.  Great to see you!  Therapy and Counseling Resources Most providers on this list will take Medicaid. Patients with commercial insurance or Medicare should contact their insurance company to get a list of in network providers.  [facility] (spanish speaking therapist available)(habla espanol)(take medicare and medicaid)  [address], [place], [place] [address], [place] [email or web address] [phone]  [facility] Counseling [address] [place], [place] [address] [phone]  [facility]   [address] Suite [place], [place] [address]      [phone]  [facility] (spanish available) [address]  [place], [place] [address] [phone]  [facility] (take [facility] and medicare) [address]  [place], [place] [address]       [phone]     [facility] (virtual only) [phone]  [facility]ov[facility]uss[facility] [address] [place], [place] [phone]  [facility]:  [address] [place] [phone]  [facility]:  [address] [place] [phone]  [facility] (under & uninsured) [address]   [place]  [phone]    [email or web address]    [facility] [address]  [place]    [phone]  [facility] -[address]     Phone:  [phone]     [facility]-  [address]  [phone]   [facility] [address]      [place], [place] [phone]  [facility]: [address] [place], [place], [place]  [phone]   [facility] (Spanish therapist) [email or web address]  [address] [place]  [address]   [place] [address]    [phone]    [facility]   [address],  [place], [place]  [phone]   [facility]  [address] [place]  [phone]  [facility]  [address] [place], [place]        [phone]  Open Access/Walk In Clinic under & uninsured  [facility]  [address] [place], [place] [phone] Crisis [phone]  [facility] [place],  (Spanish)   [address], [place]: [phone]) 8:30 - 12; 1 - 2:30  [facility] [employer],  [address] [place], [place]    [phone]):8:30 - 12; 2 - 3PM  [facility] [employer],  [address]  [place]; [phone]):   Mon - Fri 8 AM - 5 PM  [facility] [address]  [place]  MWF 12:30 to 3:00 or call to schedule an appointment  [phone]  Specific Provider options Psychology Today  [email or web address] click on find a therapist  enter your zip code left side and select or tailor a therapist for your specific need.   [facility] Provider Directory [email or web address]  (Medicaid)   Follow all drop down to find a provider  Social Support program [facility] [place] [phone] or PhotoSolver.pl [address], [place], [place] Recovery support and educational    24- Hour Availability:   [facility]  [address] [place], [place] [phone] Crisis [phone]  [facility] [phone]  [facility]  [phone]   [facility]  [phone] (after hours)  [facility]/Mobile Crisis   [phone]  [place] National Suicide Hotline  [phone] Len Childs)  Call 911 or go to emergency room  [facility]  [phone]);  [place] and [employer]  [phone]); [place], [place], [place], [place], [place], [place], [place]

## 2021-09-07 ENCOUNTER — Encounter: Payer: Self-pay | Admitting: Family Medicine

## 2021-09-07 DIAGNOSIS — Z309 Encounter for contraceptive management, unspecified: Secondary | ICD-10-CM | POA: Insufficient documentation

## 2021-09-07 NOTE — Assessment & Plan Note (Signed)
Discussed options, discussed bone loss possibilities with long term depo provera. She will get depo today and we will schedule her for nexplanon insertion soon.

## 2021-09-07 NOTE — Assessment & Plan Note (Signed)
We will treat today.  Have given her refills.  If she continues to have this issue, we will discuss at next office visit suppression.

## 2021-10-05 ENCOUNTER — Ambulatory Visit: Payer: Medicaid Other

## 2021-10-06 ENCOUNTER — Other Ambulatory Visit: Payer: Self-pay | Admitting: *Deleted

## 2021-10-09 MED ORDER — CITALOPRAM HYDROBROMIDE 20 MG PO TABS: ORAL_TABLET | ORAL | 0 refills | Status: DC

## 2021-12-04 ENCOUNTER — Other Ambulatory Visit: Payer: Self-pay | Admitting: *Deleted

## 2021-12-04 MED ORDER — CITALOPRAM HYDROBROMIDE 20 MG PO TABS: ORAL_TABLET | ORAL | 0 refills | Status: DC

## 2022-02-21 ENCOUNTER — Other Ambulatory Visit: Payer: Self-pay | Admitting: Family Medicine

## 2022-04-05 ENCOUNTER — Ambulatory Visit (INDEPENDENT_AMBULATORY_CARE_PROVIDER_SITE_OTHER): Payer: Medicaid Other

## 2022-04-05 ENCOUNTER — Ambulatory Visit (HOSPITAL_COMMUNITY)
Admission: EM | Admit: 2022-04-05 | Discharge: 2022-04-05 | Disposition: A | Discharge status: Home / Self Care | Payer: Medicaid Other

## 2022-04-05 ENCOUNTER — Encounter (HOSPITAL_COMMUNITY): Payer: Self-pay

## 2022-04-05 DIAGNOSIS — R079 Chest pain, unspecified: Secondary | ICD-10-CM | POA: Diagnosis not present

## 2022-04-05 DIAGNOSIS — M25512 Pain in left shoulder: Secondary | ICD-10-CM

## 2022-04-05 DIAGNOSIS — R0789 Other chest pain: Secondary | ICD-10-CM | POA: Diagnosis not present

## 2022-04-05 MED ORDER — METHYLPREDNISOLONE 4 MG PO TBPK: ORAL_TABLET | ORAL | 0 refills | Status: AC

## 2022-04-05 NOTE — Discharge Instructions (Addendum)
Overall your shoulder and chest x-ray is negative for any acute abnormalities.  We do encourage you to follow up with your primary care provider if your symptoms persist for further evaluation.  You have been prescribed a Medrol Dosepak to help with the pain and inflammation. I have provided you with shoulder exercises to perform as the pain improves. These can be used ongoing for strengthening of both shoulders.

## 2022-04-05 NOTE — ED Triage Notes (Signed)
3-day h/o of left shoulder pain. Pt was getting out of the car and sneeze the wrong way. Pt report something pop. Pt has been using Motrin for pain.

## 2022-04-05 NOTE — ED Provider Notes (Signed)
[facility]    CSN: [id number] Arrival date & time: 04/05/22  1258      History   Chief Complaint Chief Complaint  Patient presents with   Shoulder Pain    HPI Suzanne Ellis is a 37 y.o. female.   HPI  A right hand dominant female is in today complaining of left shoulder pain for 2 days. She rates the pain a  4/10. She describes it as a cramping pain that goes under her shoulder . She reports that she sneezed while getting out of her talking on the phone. She heard something pop. The pain developed later. he does have some weakness. She has been using OTC motrin with some relief. She is a Freight forwarder at [employer]. She denies any previous neck or shoulder injury.  She denies numbness, tingling.  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 Authorizing Provider  methylPREDNISolone (MEDROL) 4 MG TBPK tablet  Follow package instructions. 04/05/22 04/11/22 Yes Vevelyn Francois, NP  albuterol (VENTOLIN HFA) 108 (90 Base) MCG/ACT inhaler Inhale into the lungs. 03/21/21   [provider]  buPROPion (WELLBUTRIN XL) 150 MG 24 hr tablet TAKE 1 TABLET(150 MG) BY MOUTH DAILY 02/22/22   Dickie La, MD  citalopram (CELEXA) 20 MG tablet TAKE 1 TABLET(20 MG) BY MOUTH DAILY 02/22/22   Dickie La, MD  pyridOXINE (VITAMIN B6) 25 MG tablet Take by mouth. 03/21/21   [provider]  venlafaxine (EFFEXOR) 75 MG tablet Take by mouth.    [provider]    Family History Family History  Problem Relation Age of Onset   Hypertension Mother    Cancer Mother        lung   Migraines Mother    Anesthesia problems Other        MGF allergic to a common type of anesthesia   Cancer Maternal Grandmother        skin   Diabetes Maternal Grandmother    Cervical cancer Sister    Migraines Sister    Seizures Sister    Hypotension Neg Hx  Malignant hyperthermia Neg Hx    Pseudochol deficiency Neg Hx     Social History Social History   Tobacco Use   Smoking status: Every Day    Packs/day: 0.50    Years: 10.00    Total pack years: 5.00    Types: Cigarettes   Smokeless tobacco: Never  Substance Use Topics   Alcohol use: Yes    Comment: occasionally    Drug use: No     Allergies   Lamictal [lamotrigine]   Review of Systems Review of Systems   Physical Exam Triage Vital Signs ED Triage Vitals  Enc Vitals Group     BP      Pulse      Resp      Temp      Temp src      SpO2      Weight      Height      Head Circumference      Peak Flow      Pain Score      Pain Loc      Pain Edu?      Excl. in [place]?    No data found.  Updated Vital Signs BP 121/75 (BP Location: Left Arm)   Pulse 73   Temp 98.4 F (36.9 C) (Oral)   Resp 18   SpO2 100%   Visual Acuity Right Eye Distance:   Left Eye Distance:   Bilateral Distance:    Right Eye Near:   Left Eye Near:     Bilateral Near:     Physical Exam Constitutional:      General: She is in acute distress.  HENT:     Head: Normocephalic and atraumatic.  Cardiovascular:     Rate and Rhythm: Normal rate.  Pulmonary:     Effort: Pulmonary effort is normal.  Musculoskeletal:     Right shoulder: Normal.     Left shoulder: Tenderness present. No deformity, effusion or crepitus. Decreased range of motion. Decreased strength. Normal pulse.     Cervical back: Normal range of motion. Tenderness present.  Skin:    General: Skin is warm and dry.     Capillary Refill: Capillary refill takes less than 2 seconds.  Neurological:     General: No focal deficit present.     Mental Status: She is alert and oriented to person, place, and time.  Psychiatric:        Mood and Affect: Mood normal.        Behavior: Behavior normal.      UC Treatments / Results  Labs (all labs ordered are listed, but only abnormal results are displayed) Labs Reviewed - No data to display  EKG   Radiology DG Chest 1 View  Result Date: 04/05/2022 CLINICAL DATA:  Left shoulder pain. EXAM: CHEST  1 VIEW COMPARISON:  Chest radiograph 03/21/2021 FINDINGS: The cardiomediastinal contours are within normal limits. The lungs are clear. No pneumothorax or pleural effusion. No acute finding in the visualized skeleton. IMPRESSION: No acute finding in the chest. Electronically Signed   By: Audie Pinto M.D.   On: 04/05/2022 16:50   DG Shoulder Left  Result Date: 04/05/2022 CLINICAL DATA:  3-day h/o of left shoulder pain. Pt was getting out of the car and sneezed the wrong way. EXAM: LEFT SHOULDER - 2+ VIEW COMPARISON:  None Available. FINDINGS: There is no evidence of fracture or dislocation. There is no evidence of arthropathy or other focal bone abnormality. Soft tissues are  unremarkable. IMPRESSION: Negative. Electronically Signed   By: Emmaline Kluver M.D.   On: 04/05/2022 16:48    Procedures Procedures (including critical care  time)  Medications Ordered in UC Medications - No data to display  Initial Impression / Assessment and Plan / UC Course  I have reviewed the triage vital signs and the nursing notes.  Pertinent labs & imaging results that were available during my care of the patient were reviewed by me and considered in my medical decision making (see chart for details).     Left shoulder pain Final Clinical Impressions(s) / UC Diagnoses   Final diagnoses:  Acute pain of left shoulder     Discharge Instructions      Overall your shoulder and chest x-ray is negative for any acute abnormalities.  We do encourage you to follow up with your primary care provider if your symptoms persist for further evaluation.  You have been prescribed a Medrol Dosepak to help with the pain and inflammation. I have provided you with shoulder exercises to perform as the pain improves. These can be used ongoing for strengthening of both shoulders.      ED Prescriptions     Medication Sig Dispense Auth. Provider   methylPREDNISolone (MEDROL) 4 MG TBPK tablet Follow package instructions. 21 tablet Barbette Merino, NP      PDMP not reviewed this encounter.   Thad Ranger [place], [place] 04/05/22 [phone]

## 2022-06-27 ENCOUNTER — Ambulatory Visit: Payer: Medicaid Other | Admitting: Family Medicine

## 2022-06-27 ENCOUNTER — Encounter: Payer: Self-pay | Admitting: Family Medicine

## 2022-06-27 VITALS — BP 110/62 | HR 78 | Ht 65.0 in | Wt 184.2 lb

## 2022-06-27 DIAGNOSIS — F418 Other specified anxiety disorders: Secondary | ICD-10-CM | POA: Diagnosis not present

## 2022-06-27 DIAGNOSIS — N760 Acute vaginitis: Secondary | ICD-10-CM | POA: Diagnosis not present

## 2022-06-27 DIAGNOSIS — Z3042 Encounter for surveillance of injectable contraceptive: Secondary | ICD-10-CM | POA: Diagnosis not present

## 2022-06-27 DIAGNOSIS — B9689 Other specified bacterial agents as the cause of diseases classified elsewhere: Secondary | ICD-10-CM

## 2022-06-27 LAB — POCT URINE PREGNANCY: Preg Test, Ur: NEGATIVE

## 2022-06-27 MED ORDER — METHYLPHENIDATE HCL ER (OSM) 36 MG PO TBCR: 36.0000 mg | EXTENDED_RELEASE_TABLET | Freq: Every day | ORAL | 0 refills | Status: DC

## 2022-06-27 MED ORDER — METRONIDAZOLE 0.75 % VA GEL: 1.0000 | Freq: Two times a day (BID) | VAGINAL | 5 refills | Status: DC

## 2022-06-27 MED ORDER — FLUCONAZOLE 150 MG PO TABS: 150.0000 mg | ORAL_TABLET | Freq: Once | ORAL | 5 refills | Status: AC

## 2022-06-27 NOTE — Assessment & Plan Note (Signed)
Recommend continue current management with boric acid vaginal suppositories.  I have refilled those.  I have also given her a refill of the MetroGel.

## 2022-06-27 NOTE — Progress Notes (Signed)
    CHIEF COMPLAINT / HPI:   #1.  Long history of problems with focusing.  Says she has been diagnosed with depression, diagnosed with anxiety, at one point was diagnosed with bipolar disorder.  She does not feel any of these are necessarily the right diagnosis.  She has been doing some reading and research and feels like she does not really fit any of these moles.  She stopped all of her psychiatric medications and says she does not elicit feeling different.  Her main complaint is of trouble focusing and task completion is most notable at work.  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  She says so far with extra hard work she has not been fired but it is a daily concern for her.  She does a lot of note writing so that she will not forget things.  She reports this has been an ongoing problem since she was in school.  She is never been diagnosed or treated for ADHD. 2.  Recurrent BV: She done pretty well with the boric acid vaginal suppositories.  She [place] them on [place] uses them 1 week out of the month and that has helped a lot.  She did run out and needs a refill on the metronidazole gel to get her back on track.  Says she is due for Pap smear would like to do it in a month or so.  PERTINENT  PMH / PSH: I have reviewed the patient's medications, allergies, past medical and surgical history, smoking status and updated in the EMR as appropriate.   OBJECTIVE:  BP 110/62   Pulse 78   Ht 5\' 5"  (1.651 m)   Wt 184 lb 3.2 oz (83.6 kg)   SpO2 99%   BMI 30.65 kg/m   PSYCH: AxOx4. Good eye contact.. No psychomotor retardation or agitation. Appropriate speech fluency and content. Asks and answers questions appropriately. Mood is congruent.  ASSESSMENT / PLAN:   Recurrent bacterial vaginosis Recommend continue current management with boric acid vaginal suppositories.  I have refilled those.  I have also given her a refill of the MetroGel.  Depression  with anxiety Long discussion about her symptoms and diagnoses.  It is unclear to me whether she has focusing issues because of some other underlying condition or if this is a primary issue.  We talked about possibly getting neuropsych testing although that may be prohibitively expensive.  Also talked about psychiatric referral.  Ultimately we decided on a field trial of Concerta.  We discussed at length risks and benefits.  I will see her back in 3 to 4 weeks.   Denny Levy MD

## 2022-06-27 NOTE — Assessment & Plan Note (Signed)
Long discussion about her symptoms and diagnoses.  It is unclear to me whether she has focusing issues because of some other underlying condition or if this is a primary issue.  We talked about possibly getting neuropsych testing although that may be prohibitively expensive.  Also talked about psychiatric referral.  Ultimately we decided on a field trial of Concerta.  We discussed at length risks and benefits.  I will see her back in 3 to 4 weeks.

## 2022-07-25 ENCOUNTER — Ambulatory Visit: Payer: Medicaid Other | Admitting: Family Medicine

## 2022-07-25 ENCOUNTER — Other Ambulatory Visit: Payer: Self-pay

## 2022-07-25 MED ORDER — METHYLPHENIDATE HCL ER (OSM) 36 MG PO TBCR: 36.0000 mg | EXTENDED_RELEASE_TABLET | Freq: Every day | ORAL | 0 refills | Status: DC

## 2022-08-01 ENCOUNTER — Encounter: Payer: Self-pay | Admitting: Family Medicine

## 2022-08-01 ENCOUNTER — Other Ambulatory Visit (HOSPITAL_COMMUNITY)
Admission: RE | Admit: 2022-08-01 | Discharge: 2022-08-01 | Disposition: A | Discharge status: Home / Self Care | Payer: Medicaid Other | Source: Ambulatory Visit | Attending: Family Medicine | Admitting: Family Medicine

## 2022-08-01 ENCOUNTER — Ambulatory Visit (INDEPENDENT_AMBULATORY_CARE_PROVIDER_SITE_OTHER): Payer: Medicaid Other | Admitting: Family Medicine

## 2022-08-01 VITALS — BP 108/62 | HR 71 | Ht 65.0 in | Wt 180.6 lb

## 2022-08-01 DIAGNOSIS — Z30013 Encounter for initial prescription of injectable contraceptive: Secondary | ICD-10-CM | POA: Diagnosis not present

## 2022-08-01 DIAGNOSIS — Z124 Encounter for screening for malignant neoplasm of cervix: Secondary | ICD-10-CM

## 2022-08-01 DIAGNOSIS — Z113 Encounter for screening for infections with a predominantly sexual mode of transmission: Secondary | ICD-10-CM | POA: Diagnosis not present

## 2022-08-01 DIAGNOSIS — F908 Attention-deficit hyperactivity disorder, other type: Secondary | ICD-10-CM | POA: Insufficient documentation

## 2022-08-01 DIAGNOSIS — Z3202 Encounter for pregnancy test, result negative: Secondary | ICD-10-CM

## 2022-08-01 LAB — POCT WET PREP (WET MOUNT)
Clue Cells Wet Prep Whiff POC: NEGATIVE
Trichomonas Wet Prep HPF POC: ABSENT
WBC, Wet Prep HPF POC: NONE SEEN

## 2022-08-01 LAB — POCT URINE PREGNANCY: Preg Test, Ur: NEGATIVE

## 2022-08-01 MED ORDER — MEDROXYPROGESTERONE ACETATE 150 MG/ML IM SUSY
150.0000 mg | PREFILLED_SYRINGE | Freq: Once | INTRAMUSCULAR | Status: AC
  Administered 2022-08-01: 150 mg via INTRAMUSCULAR

## 2022-08-01 NOTE — Patient Instructions (Signed)
Will send you a note through MyChart about your pap and tests.  I am really glad the new medicine is working so well  for you. We discussed, I will plan on seeing you in 3 or 4 months as a foolow up to that, certainly sooner with something's new or if you need to discuss or change anything. Great to see you!

## 2022-08-01 NOTE — Assessment & Plan Note (Signed)
STI and wet prep checked today.  Pap also obtained.

## 2022-08-01 NOTE — Assessment & Plan Note (Signed)
Restart Depo-Provera.  Negative pregnancy test.  She has been on it before and has no questions.

## 2022-08-01 NOTE — Progress Notes (Signed)
    CHIEF COMPLAINT / HPI:   #1.  Follow-up for starting Concerta.  Significant improvement.  Probably 50 to 75% better at focusing on work and getting things done.  No problems sleeping.  Taking it regularly.  Extremely excited about how improved things have been since she started it. 2.  Follow-up contraception: She is sad to go ahead and go back on Depo-Provera. 3.  Also here for Pap smear today.  PERTINENT  PMH / PSH: I have reviewed the patient's medications, allergies, past medical and surgical history, smoking status and updated in the EMR as appropriate.   OBJECTIVE:  BP 108/62   Pulse 71   Ht 5\' 5"  (1.651 m)   Wt 180 lb 9.6 oz (81.9 kg)   LMP 07/02/2022   SpO2 98%   BMI 30.05 kg/m  GENERAL: Well-developed female no acute distress GU: Externally normal.  Cervix appears normal with no exudate no lesions.  Bimanual was normal with no adnexal masses or tenderness. PSYCH: AxOx4. Good eye contact.. No psychomotor retardation or agitation. Appropriate speech fluency and content. Asks and answers questions appropriately. Mood is congruent.   ASSESSMENT / PLAN:   ADHD, adult residual type Review of her history at last office visit led to a field trial of concerta.  She really had positive experience with starting Concerta.  Will continue at current dose.  I will plan on seeing her every 3 to 4 months unless she has some issues.  She is appropriately happy with this.    Screen for STD (sexually transmitted disease) STI and wet prep checked today.  Pap also obtained.  Encounter for contraceptive management Restart Depo-Provera.  Negative pregnancy test.  She has been on it before and has no questions.   Denny Levy MD

## 2022-08-01 NOTE — Progress Notes (Signed)
Patient here today for Depo Provera injection. Urine pregnancy test is negative  Depo given in LUOQ today.  Site unremarkable & patient tolerated injection.    Next injection due October 17, 2022 - Aug 14,2024.  Reminder card given.    [name], CMA

## 2022-08-01 NOTE — Assessment & Plan Note (Addendum)
Review of her history at last office visit led to a field trial of concerta.  She really had positive experience with starting Concerta.  Will continue at current dose.  I will plan on seeing her every 3 to 4 months unless she has some issues.  She is appropriately happy with this.

## 2022-08-06 ENCOUNTER — Encounter: Payer: Self-pay | Admitting: Nurse Practitioner

## 2022-08-06 ENCOUNTER — Telehealth: Payer: Medicaid Other | Admitting: Nurse Practitioner

## 2022-08-06 LAB — CYTOLOGY - PAP
Chlamydia: NEGATIVE
Comment: NEGATIVE
Comment: NEGATIVE
Comment: NORMAL
High risk HPV: POSITIVE — AB
Neisseria Gonorrhea: NEGATIVE

## 2022-08-06 NOTE — Progress Notes (Signed)
No show for Video Visit  

## 2022-08-10 ENCOUNTER — Telehealth: Payer: Self-pay | Admitting: Family Medicine

## 2022-08-10 MED ORDER — FLUCONAZOLE 150 MG PO TABS: ORAL_TABLET | ORAL | 5 refills | Status: AC

## 2022-08-10 MED ORDER — METRONIDAZOLE 500 MG PO TABS: 500.0000 mg | ORAL_TABLET | Freq: Two times a day (BID) | ORAL | 3 refills | Status: AC

## 2022-08-10 NOTE — Telephone Encounter (Signed)
Spoke with patient about her Pap smear.  I will get her set up for a colposcopy.  Additionally she was going to call and leave me a message today about the MetroGel.  Is causing a lot of vaginal irritation so she wants to be switched to the oral pill.  I will call that in.  She also wanted a refill on Diflucan which I will call in.

## 2022-08-25 ENCOUNTER — Other Ambulatory Visit: Payer: Self-pay | Admitting: Family Medicine

## 2022-08-27 MED ORDER — METHYLPHENIDATE HCL ER (OSM) 54 MG PO TBCR: 54.0000 mg | EXTENDED_RELEASE_TABLET | ORAL | 0 refills | Status: DC

## 2022-08-31 ENCOUNTER — Other Ambulatory Visit: Payer: Self-pay | Admitting: Family Medicine

## 2022-08-31 MED ORDER — METHYLPHENIDATE HCL ER (OSM) 36 MG PO TBCR: 36.0000 mg | EXTENDED_RELEASE_TABLET | Freq: Every day | ORAL | 0 refills | Status: DC

## 2022-08-31 NOTE — Telephone Encounter (Signed)
Pt could not obtain 54 mg tabs so we will send rx for prior dose and try again in a month to increase to 54

## 2022-09-13 ENCOUNTER — Ambulatory Visit: Payer: Medicaid Other

## 2022-09-13 NOTE — Progress Notes (Deleted)
    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  Pap 08/01/2022 LSIS and high grade HPV. She is here for colposcopy today   PERTINENT  PMH / PSH: Hx abnormal pap smears  OBJECTIVE:   There were no vitals taken for this visit. ***  General: NAD, pleasant, able to participate in exam Cardiac: RRR, no murmurs. Respiratory: CTAB, normal effort, No wheezes, rales or rhonchi Abdomen: Bowel sounds present, nontender, nondistended, no hepatosplenomegaly. Extremities: no edema or cyanosis. Skin: warm and dry, no rashes noted Neuro: alert, no obvious focal deficits Psych: Normal affect and mood  Colposcopy Procedure:   Consent signed and reviewed with patient: Side effects, risks and complications discussed (including bleeding, infection, vaginal discharge, pain or discomfort, non-diagnostic colposcopy result)  Procedure The vulva, vagina and perianal regions were examined with findings of: *** no gross abnormalities A speculum was placed, and the cervix and vagina was treated with: Diluted acetic acid, Lugol's solution Cervix: Fully visualized / Not fully visualized due to *** Squamocolumnar junction:  Fully visualized / Not fully visualized due to *** Acetowhite or non-Lugol's staining: ***visualized lesion Acetowhite or non-Lugol's staining lesion(s) description, including location, size and features:  Colposcopic impression: ***Normal/Benign Findings/Low Grade/High Grade/Cancer    Cervical biopsies obtained (Note: 2 or more biopsies increase the sensitivity of colposcopy): ? Cervical biopsy at ___ o'clock Endocervical curettage with: ? brush ? curette ? both:  ? Cervical biopsy at ___ o'clock ? performed ? not performed ? Cervical biopsy at ___ o'clock Additional biopsies:_____________________________________________ ? Cervical biopsy at ___ o'clock _____________________________________________________________ ? Cervical biopsy at ___ o'clock  _____________________________________________________________  Hemostatic measures used: ? None ? Pressure ? Monsel's solution ? Silver nitrate ? Stitch ? Other ? There were no complications. ? Complications: ____________________________________________________ ? Patient tolerated procedure: ? well ? fairly well ? poorly ? Pt did not tolerate procedure ? If treatment indicated, in-office LEEP is feasible and acceptable to patient   ASSESSMENT/PLAN:   No problem-specific Assessment & Plan notes found for this encounter.     Dr. Erick Alley, DO [facility]    {    This will disappear when note is signed, click to select method of visit    :1}

## 2022-09-27 ENCOUNTER — Ambulatory Visit: Payer: Medicaid Other

## 2022-10-29 ENCOUNTER — Other Ambulatory Visit: Payer: Self-pay | Admitting: Family Medicine

## 2022-10-29 MED ORDER — METHYLPHENIDATE HCL ER (OSM) 36 MG PO TBCR: 36.0000 mg | EXTENDED_RELEASE_TABLET | Freq: Every day | ORAL | 0 refills | Status: DC

## 2022-10-30 MED ORDER — METHYLPHENIDATE HCL ER (OSM) 54 MG PO TBCR: 54.0000 mg | EXTENDED_RELEASE_TABLET | ORAL | 0 refills | Status: DC

## 2022-10-30 NOTE — Telephone Encounter (Signed)
Dear [place] [facility] Team I sent ion wrong dose of Concerta yesterday. (36 mg) can you call the pharmacy and cancel that. I have set in appropriate dose today. Pt is aware. Thanks! Pharmacy phone [phone].

## 2022-12-28 ENCOUNTER — Other Ambulatory Visit: Payer: Self-pay | Admitting: Family Medicine

## 2022-12-28 MED ORDER — METHYLPHENIDATE HCL ER (OSM) 54 MG PO TBCR: 54.0000 mg | EXTENDED_RELEASE_TABLET | ORAL | 0 refills | Status: DC

## 2023-01-02 ENCOUNTER — Telehealth: Payer: Medicaid Other | Admitting: Physician Assistant

## 2023-01-02 ENCOUNTER — Other Ambulatory Visit: Payer: Self-pay | Admitting: Family Medicine

## 2023-01-02 DIAGNOSIS — B9689 Other specified bacterial agents as the cause of diseases classified elsewhere: Secondary | ICD-10-CM | POA: Diagnosis not present

## 2023-01-02 DIAGNOSIS — N760 Acute vaginitis: Secondary | ICD-10-CM

## 2023-01-03 MED ORDER — METRONIDAZOLE 500 MG PO TABS: 500.0000 mg | ORAL_TABLET | Freq: Two times a day (BID) | ORAL | 0 refills | Status: AC

## 2023-01-03 MED ORDER — METHYLPHENIDATE HCL ER (OSM) 54 MG PO TBCR: 54.0000 mg | EXTENDED_RELEASE_TABLET | ORAL | 0 refills | Status: DC

## 2023-01-03 NOTE — Progress Notes (Signed)
I have spent 5 minutes in review of e-visit questionnaire, review and updating patient chart, medical decision making and response to patient.   Mia Milan Cody Jacklynn Dehaas, PA-C    

## 2023-01-03 NOTE — Progress Notes (Signed)

## 2023-01-23 ENCOUNTER — Other Ambulatory Visit: Payer: Self-pay

## 2023-01-23 ENCOUNTER — Encounter: Payer: Self-pay | Admitting: Family Medicine

## 2023-01-23 ENCOUNTER — Ambulatory Visit: Payer: Medicaid Other | Admitting: Family Medicine

## 2023-01-23 VITALS — BP 113/72 | HR 83 | Ht 65.0 in | Wt 169.8 lb

## 2023-01-23 DIAGNOSIS — F908 Attention-deficit hyperactivity disorder, other type: Secondary | ICD-10-CM

## 2023-01-24 NOTE — Assessment & Plan Note (Signed)
Discussed ADHD diagnosis in children and adults.  I do think she fits the ADHD adult residual type and we discussed this.  She seems to be doing extremely well on this current medication regimen and I would continue without any need for change.  She will let me know when she needs refills.

## 2023-01-24 NOTE — Progress Notes (Signed)
    CHIEF COMPLAINT / HPI: #1.  Follow-up ADHD medication: She still quite pleased with how it is helping her in many aspects.  She has a lot of questions about why this was not previously diagnosed and if she is truly now carrying the diagnosis of ADHD. 2.  Saw on the Internet that there was some question of Depo-Provera use with brain tumor and she has questions.   PERTINENT  PMH / PSH: I have reviewed the patient's medications, allergies, past medical and surgical history, smoking status and updated in the EMR as appropriate.   OBJECTIVE:  BP 113/72   Pulse 83   Ht 5\' 5"  (1.651 m)   Wt 169 lb 12.8 oz (77 kg)   SpO2 100%   BMI 28.26 kg/m  GENERAL: Well-developed, no acute distress PSYCH: AxOx4. Good eye contact.. No psychomotor retardation or agitation. Appropriate speech fluency and content. Asks and answers questions appropriately. Mood is congruent. GAIT: Normal gait.  Rises from a chair easily.  ASSESSMENT / PLAN:   ADHD, adult residual type Discussed ADHD diagnosis in children and adults.  I do think she fits the ADHD adult residual type and we discussed this.  She seems to be doing extremely well on this current medication regimen and I would continue without any need for change.  She will let me know when she needs refills. #2.  Questions about Depo-Provera: I reviewed the Internet postings about this and there was a study in [place] that showed a very small increase in association of meningioma tin he patients who had been on long-term medroxyprogesterone.  I think this is minimal risk at best.  Most of the hype on the Internet is about the lawsuits.  She is not having any issues with it and it seems to be working well for her so we will continue. Denny Levy MD

## 2023-03-28 ENCOUNTER — Other Ambulatory Visit: Payer: Self-pay | Admitting: Family Medicine

## 2023-03-29 MED ORDER — METHYLPHENIDATE HCL ER (OSM) 54 MG PO TBCR: 54.0000 mg | EXTENDED_RELEASE_TABLET | ORAL | 0 refills | Status: DC

## 2023-05-06 ENCOUNTER — Other Ambulatory Visit: Payer: Self-pay | Admitting: Family Medicine

## 2023-05-07 MED ORDER — METHYLPHENIDATE HCL ER (OSM) 54 MG PO TBCR: 54.0000 mg | EXTENDED_RELEASE_TABLET | ORAL | 0 refills | Status: DC

## 2023-06-18 ENCOUNTER — Other Ambulatory Visit: Payer: Self-pay | Admitting: Family Medicine

## 2023-06-20 MED ORDER — METHYLPHENIDATE HCL ER (OSM) 54 MG PO TBCR: 54.0000 mg | EXTENDED_RELEASE_TABLET | ORAL | 0 refills | Status: DC

## 2023-06-21 ENCOUNTER — Other Ambulatory Visit (HOSPITAL_COMMUNITY): Payer: Self-pay

## 2023-07-30 ENCOUNTER — Other Ambulatory Visit: Payer: Self-pay | Admitting: Family Medicine

## 2023-07-31 ENCOUNTER — Encounter: Payer: Self-pay | Admitting: Family Medicine

## 2023-07-31 MED ORDER — METHYLPHENIDATE HCL ER (OSM) 54 MG PO TBCR: 54.0000 mg | EXTENDED_RELEASE_TABLET | ORAL | 0 refills | Status: AC

## 2023-08-01 ENCOUNTER — Encounter: Payer: Self-pay | Admitting: Family Medicine
